# Patient Record
Sex: Male | Born: 1945 | Race: Black or African American | Hispanic: No | Marital: Married | Smoking: Never smoker
Health system: Southern US, Community
[De-identification: ages and names within clinical notes are randomized; demographics above are authoritative.]

## PROBLEM LIST (undated history)

## (undated) DIAGNOSIS — R519 Headache, unspecified: Secondary | ICD-10-CM

## (undated) DIAGNOSIS — R51 Headache: Secondary | ICD-10-CM

## (undated) DIAGNOSIS — I10 Essential (primary) hypertension: Secondary | ICD-10-CM

## (undated) DIAGNOSIS — E119 Type 2 diabetes mellitus without complications: Secondary | ICD-10-CM

## (undated) DIAGNOSIS — M1990 Unspecified osteoarthritis, unspecified site: Secondary | ICD-10-CM

## (undated) HISTORY — PX: HIP ARTHROPLASTY: SHX981

## (undated) HISTORY — PX: DG BARIUM ENEMA (ARMC HX): HXRAD1447

## (undated) HISTORY — PX: COLONOSCOPY: SHX174

---

## 2002-08-16 HISTORY — PX: KNEE ARTHROSCOPY: SHX127

## 2003-04-07 ENCOUNTER — Emergency Department (HOSPITAL_COMMUNITY): Admission: EM | Admit: 2003-04-07 | Discharge: 2003-04-07 | Payer: Self-pay | Admitting: Emergency Medicine

## 2003-06-13 ENCOUNTER — Emergency Department (HOSPITAL_COMMUNITY): Admission: EM | Admit: 2003-06-13 | Discharge: 2003-06-13 | Payer: Self-pay | Admitting: Emergency Medicine

## 2009-04-23 ENCOUNTER — Emergency Department (HOSPITAL_COMMUNITY): Admission: EM | Admit: 2009-04-23 | Discharge: 2009-04-23 | Payer: Self-pay | Admitting: Emergency Medicine

## 2017-07-28 ENCOUNTER — Ambulatory Visit: Payer: Self-pay | Admitting: Orthopedic Surgery

## 2017-07-28 NOTE — Pre-Procedure Instructions (Signed)
Spoke with Santiago Bur requesting pre op orders from Dr. Lyla Glassing for pre op appointment 07/29/17.

## 2017-07-29 ENCOUNTER — Encounter (HOSPITAL_COMMUNITY)
Admission: RE | Admit: 2017-07-29 | Discharge: 2017-07-29 | Disposition: A | Discharge status: Home / Self Care | Payer: Self-pay | Source: Ambulatory Visit | Attending: Orthopedic Surgery | Admitting: Orthopedic Surgery

## 2017-07-29 ENCOUNTER — Encounter (HOSPITAL_COMMUNITY): Payer: Self-pay

## 2017-07-29 ENCOUNTER — Other Ambulatory Visit: Payer: Self-pay

## 2017-07-29 DIAGNOSIS — M1711 Unilateral primary osteoarthritis, right knee: Secondary | ICD-10-CM | POA: Insufficient documentation

## 2017-07-29 DIAGNOSIS — Z01818 Encounter for other preprocedural examination: Secondary | ICD-10-CM | POA: Insufficient documentation

## 2017-07-29 HISTORY — DX: Type 2 diabetes mellitus without complications: E11.9

## 2017-07-29 HISTORY — DX: Headache: R51

## 2017-07-29 HISTORY — DX: Essential (primary) hypertension: I10

## 2017-07-29 HISTORY — DX: Headache, unspecified: R51.9

## 2017-07-29 HISTORY — DX: Unspecified osteoarthritis, unspecified site: M19.90

## 2017-07-29 LAB — SURGICAL PCR SCREEN
MRSA, PCR: NEGATIVE
STAPHYLOCOCCUS AUREUS: NEGATIVE

## 2017-07-29 LAB — BASIC METABOLIC PANEL
Anion gap: 10 (ref 5–15)
BUN: 20 mg/dL (ref 6–20)
CHLORIDE: 101 mmol/L (ref 101–111)
CO2: 28 mmol/L (ref 22–32)
CREATININE: 1.26 mg/dL — AB (ref 0.61–1.24)
Calcium: 9.5 mg/dL (ref 8.9–10.3)
GFR, EST NON AFRICAN AMERICAN: 55 mL/min — AB (ref >60)
Glucose, Bld: 110 mg/dL — ABNORMAL HIGH (ref 65–99)
POTASSIUM: 3.7 mmol/L (ref 3.5–5.1)
SODIUM: 139 mmol/L (ref 135–145)

## 2017-07-29 LAB — CBC
HCT: 33.1 % — ABNORMAL LOW (ref 39.0–52.0)
HEMOGLOBIN: 11.5 g/dL — AB (ref 13.0–17.0)
MCH: 33 pg (ref 26.0–34.0)
MCHC: 34.7 g/dL (ref 30.0–36.0)
MCV: 94.8 fL (ref 78.0–100.0)
PLATELETS: 213 10*3/uL (ref 150–400)
RBC: 3.49 MIL/uL — AB (ref 4.22–5.81)
RDW: 12.7 % (ref 11.5–15.5)
WBC: 6.9 10*3/uL (ref 4.0–10.5)

## 2017-07-29 LAB — GLUCOSE, CAPILLARY: GLUCOSE-CAPILLARY: 116 mg/dL — AB (ref 65–99)

## 2017-07-29 NOTE — Pre-Procedure Instructions (Signed)
CBC and BMP results faxed to Dr. Lyla Glassing via epic.

## 2017-07-29 NOTE — Pre-Procedure Instructions (Signed)
Surgical clearance from Dr. Hortencia Conradi 07/26/17, Hgb A 1 C 6.5 07/26/17, CBC results 07/26/17 in chart

## 2017-07-29 NOTE — Patient Instructions (Addendum)
[name] [name]  07/29/2017   Your procedure is scheduled on: Thursday, Dec. 20, 2018   Report to [facility] Main  Entrance   Take [place]  elevators to 3rd floor to  [place] at 10:00 AM.    Call this number if you have problems the morning of surgery [phone]    Remember: ONLY 1 PERSON MAY GO WITH YOU TO SHORT STAY TO GET  READY MORNING OF [place].   Do not eat food or drink liquids :After Midnight.    Take these medicines the morning of surgery with A SIP OF WATER: Amlodipine   DO NOT TAKE ANY DIABETIC MEDICATIONS DAY OF YOUR SURGERY   Do NOT do Regular Insulin the night before surgery   Do not take Metformin the night before surgery                               You may not have any metal on your body              Do not wear jewelry, make-up, lotions, powders or perfumes, deodorant                        Men may shave face and neck.   Do not bring valuables to the hospital. [place].   Contacts, dentures or bridgework may not be worn into surgery.   Leave suitcase in the car. After surgery it may be brought to your room.              Please read over the following fact sheets you were given: _____________________________________________________________________ [facility] - Preparing for Surgery Before surgery, you can play an important role.  Because skin is not sterile, your skin needs to be as free of germs as possible.  You can reduce the number of germs on your skin by washing with CHG (chlorahexidine gluconate) soap before surgery.  CHG is an antiseptic cleaner which kills germs and bonds with the skin to continue killing germs even after washing. Please DO NOT use if you have an allergy to CHG or antibacterial soaps.  If your skin becomes reddened/irritated stop using the CHG and inform your nurse when you arrive at Short Stay. Do not shave (including legs and underarms) for  at least 48 hours prior to the first CHG shower.  You may shave your face/neck.  Please follow these instructions carefully:  1.  Shower with CHG Soap the night before surgery and the  morning of surgery.  2.  If you choose to wash your hair, wash your hair first as usual with your normal  shampoo.  3.  After you shampoo, rinse your hair and body thoroughly to remove the shampoo.                             4.  Use CHG as you would any other liquid soap.  You can apply chg directly to the skin and wash.  Gently with a scrungie or clean washcloth.  5.  Apply the CHG Soap to your body ONLY FROM THE NECK DOWN.   Do   not use on face/ open  Wound or open sores. Avoid contact with eyes, ears mouth and   genitals (private parts).                       Wash face,  Genitals (private parts) with your normal soap.             6.  Wash thoroughly, paying special attention to the area where your    surgery  will be performed.  7.  Thoroughly rinse your body with warm water from the neck down.  8.  DO NOT shower/wash with your normal soap after using and rinsing off the CHG Soap.                9.  Pat yourself dry with a clean towel.            10.  Wear clean pajamas.            11.  Place clean sheets on your bed the night of your first shower and do not  sleep with pets. Day of Surgery : Do not apply any lotions/deodorants the morning of surgery.  Please wear clean clothes to the hospital/surgery center.  FAILURE TO FOLLOW THESE INSTRUCTIONS MAY RESULT IN THE CANCELLATION OF YOUR SURGERY  PATIENT SIGNATURE_________________________________  NURSE SIGNATURE__________________________________  ________________________________________________________________________   Adam Phenix  An incentive spirometer is a tool that can help keep your lungs clear and active. This tool measures how well you are filling your lungs with each breath. Taking long deep breaths may help  reverse or decrease the chance of developing breathing (pulmonary) problems (especially infection) following:  A long period of time when you are unable to move or be active. BEFORE THE PROCEDURE   If the spirometer includes an indicator to show your best effort, your nurse or respiratory therapist will set it to a desired goal.  If possible, sit up straight or lean slightly forward. Try not to slouch.  Hold the incentive spirometer in an upright position. INSTRUCTIONS FOR USE  1. Sit on the edge of your bed if possible, or sit up as far as you can in bed or on a chair. 2. Hold the incentive spirometer in an upright position. 3. Breathe out normally. 4. Place the mouthpiece in your mouth and seal your lips tightly around it. 5. Breathe in slowly and as deeply as possible, raising the piston or the ball toward the top of the column. 6. Hold your breath for 3-5 seconds or for as long as possible. Allow the piston or ball to fall to the bottom of the column. 7. Remove the mouthpiece from your mouth and breathe out normally. 8. Rest for a few seconds and repeat Steps 1 through 7 at least 10 times every 1-2 hours when you are awake. Take your time and take a few normal breaths between deep breaths. 9. The spirometer may include an indicator to show your best effort. Use the indicator as a goal to work toward during each repetition. 10. After each set of 10 deep breaths, practice coughing to be sure your lungs are clear. If you have an incision (the cut made at the time of surgery), support your incision when coughing by placing a pillow or rolled up towels firmly against it. Once you are able to get out of bed, walk around indoors and cough well. You may stop using the incentive spirometer when instructed by your caregiver.  RISKS AND COMPLICATIONS  Take your time  so you do not get dizzy or light-headed.  If you are in pain, you may need to take or ask for pain medication before doing incentive  spirometry. It is harder to take a deep breath if you are having pain. AFTER USE  Rest and breathe slowly and easily.  It can be helpful to keep track of a log of your progress. Your caregiver can provide you with a simple table to help with this. If you are using the spirometer at home, follow these instructions: [place] IF:   You are having difficultly using the spirometer.  You have trouble using the spirometer as often as instructed.  Your pain medication is not giving enough relief while using the spirometer.  You develop fever of 100.5 F (38.1 C) or higher. SEEK IMMEDIATE MEDICAL CARE IF:   You cough up bloody sputum that had not been present before.  You develop fever of 102 F (38.9 C) or greater.  You develop worsening pain at or near the incision site. MAKE SURE YOU:   Understand these instructions.  Will watch your condition.  Will get help right away if you are not doing well or get worse. Document Released: 12/13/2006 Document Revised: 10/25/2011 Document Reviewed: 02/13/2007 Exit[employer] Patient Information 2014 [employer], [place].   ________________________________________________________________________  WHAT IS A BLOOD TRANSFUSION? Blood Transfusion Information  A transfusion is the replacement of blood or some of its parts. Blood is made up of multiple cells which provide different functions.  Red blood cells carry oxygen and are used for blood loss replacement.  White blood cells fight against infection.  Platelets control bleeding.  Plasma helps clot blood.  Other blood products are available for specialized needs, such as hemophilia or other clotting disorders. BEFORE THE TRANSFUSION  Who gives blood for transfusions?   Healthy volunteers who are fully evaluated to make sure their blood is safe. This is blood bank blood. Transfusion therapy is the safest it has ever been in the practice of medicine. Before blood is taken from a donor, a  complete history is taken to make sure that person has no history of diseases nor engages in risky social behavior (examples are intravenous drug use or sexual activity with multiple partners). The donor's travel history is screened to minimize risk of transmitting infections, such as malaria. The donated blood is tested for signs of infectious diseases, such as HIV and hepatitis. The blood is then tested to be sure it is compatible with you in order to minimize the chance of a transfusion reaction. If you or a relative donates blood, this is often done in anticipation of surgery and is not appropriate for emergency situations. It takes many days to process the donated blood. RISKS AND COMPLICATIONS Although transfusion therapy is very safe and saves many lives, the main dangers of transfusion include:   Getting an infectious disease.  Developing a transfusion reaction. This is an allergic reaction to something in the blood you were given. Every precaution is taken to prevent this. The decision to have a blood transfusion has been considered carefully by your caregiver before blood is given. Blood is not given unless the benefits outweigh the risks. AFTER THE TRANSFUSION  Right after receiving a blood transfusion, you will usually feel much better and more energetic. This is especially true if your red blood cells have gotten low (anemic). The transfusion raises the level of the red blood cells which carry oxygen, and this usually causes an energy increase.  The  nurse administering the transfusion will monitor you carefully for complications. HOME CARE INSTRUCTIONS  No special instructions are needed after a transfusion. You may find your energy is better. Speak with your caregiver about any limitations on activity for underlying diseases you may have. SEEK MEDICAL CARE IF:   Your condition is not improving after your transfusion.  You develop redness or irritation at the intravenous (IV)  site. SEEK IMMEDIATE MEDICAL CARE IF:  Any of the following symptoms occur over the next 12 hours:  Shaking chills.  You have a temperature by mouth above 102 F (38.9 C), not controlled by medicine.  Chest, back, or muscle pain.  People around you feel you are not acting correctly or are confused.  Shortness of breath or difficulty breathing.  Dizziness and fainting.  You get a rash or develop hives.  You have a decrease in urine output.  Your urine turns a dark color or changes to pink, red, or [place]. Any of the following symptoms occur over the next 10 days:  You have a temperature by mouth above 102 F (38.9 C), not controlled by medicine.  Shortness of breath.  Weakness after normal activity.  The white part of the eye turns yellow (jaundice).  You have a decrease in the amount of urine or are urinating less often.  Your urine turns a dark color or changes to pink, red, or [place]. Document Released: 07/30/2000 Document Revised: 10/25/2011 Document Reviewed: 03/18/2008 [facility] Patient Information 2014 [place], [place].  _______________________________________________________________________

## 2017-07-30 LAB — ABO/RH: ABO/RH(D): B POS

## 2017-07-31 ENCOUNTER — Ambulatory Visit: Payer: Self-pay | Admitting: Orthopedic Surgery

## 2017-07-31 NOTE — H&P (Signed)
TOTAL KNEE ADMISSION H&P  Patient is being admitted for right total knee arthroplasty.  Subjective:  Chief Complaint:right knee pain.  HPI: [name] [name], 71 y.o. male, has a history of pain and functional disability in the right knee due to arthritis and has failed non-surgical conservative treatments for greater than 12 weeks to includeNSAID's and/or analgesics, corticosteriod injections, flexibility and strengthening excercises, use of assistive devices, weight reduction as appropriate and activity modification.  Onset of symptoms was gradual, starting 5 years ago with gradually worsening course since that time. The patient noted no past surgery on the right knee(s).  Patient currently rates pain in the right knee(s) at 10 out of 10 with activity. Patient has night pain, worsening of pain with activity and weight bearing, pain that interferes with activities of daily living, pain with passive range of motion, crepitus and joint swelling.  Patient has evidence of subchondral cysts, subchondral sclerosis, joint subluxation and joint space narrowing by imaging studies. There is no active infection.  There are no active problems to display for this patient.  Past Medical History:  Diagnosis Date  . Arthritis    both knees  . Diabetes mellitus without complication ([place])    type II  . Headache   . Hypertension     Past Surgical History:  Procedure Laterality Date  . COLONOSCOPY    . 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 HIP ARTHROPLASTY Right   . KNEE ARTHROSCOPY Right 2004    Current Outpatient Medications  Medication Sig Dispense Refill Last Dose  . amLODipine (NORVASC) 10 MG tablet Take 10 mg by mouth daily.     Marland Kitchen atorvastatin (LIPITOR) 80 MG tablet Take 80 mg by mouth daily.     Marland Kitchen docusate sodium (COLACE) 50 MG capsule Take 50 mg by mouth 2 (two) times daily.     . finasteride (PROSCAR) 5 MG tablet Take 5 mg by mouth daily.     Marland Kitchen glucose blood test strip 1 each by Other route as needed  for other. Use as instructed     . hydrochlorothiazide (HYDRODIURIL) 25 MG tablet Take 25 mg by mouth daily.     . insulin regular (NOVOLIN R,HUMULIN R) 100 units/mL injection Inject 100 Units into the skin 3 (three) times daily before meals.     Marland Kitchen lisinopril (PRINIVIL,ZESTRIL) 40 MG tablet Take 40 mg by mouth daily.     . metFORMIN (GLUCOPHAGE) 1000 MG tablet Take 1,000 mg by mouth 2 (two) times daily with a meal.     . sildenafil (VIAGRA) 100 MG tablet Take 100 mg by mouth daily as needed for erectile dysfunction.      No current facility-administered medications for this visit.    No Known Allergies  Social History   Tobacco Use  . Smoking status: Never Smoker  . Smokeless tobacco: Never Used  Substance Use Topics  . Alcohol use: Yes    No family history on file.   Review of Systems  Constitutional: Negative.   HENT: Positive for hearing loss.   Eyes: Negative.   Respiratory: Negative.   Cardiovascular: Negative.   Gastrointestinal: Negative.   Genitourinary: Negative.   Musculoskeletal: Positive for joint pain.  Neurological: Negative.   Endo/Heme/Allergies: Negative.   Psychiatric/Behavioral: Negative.     Objective:  Physical Exam  Vitals reviewed. Constitutional: He is oriented to person, place, and time. He appears well-developed and well-nourished.  HENT:  Head: Normocephalic and atraumatic.  Eyes: Conjunctivae and EOM are normal. Pupils  are equal, round, and reactive to light.  Neck: Normal range of motion. Neck supple.  Cardiovascular: Normal rate, regular rhythm and intact distal pulses.  Respiratory: Effort normal. No respiratory distress.  GI: Soft. He exhibits no distension.  Genitourinary:  Genitourinary Comments: deferred  Musculoskeletal:       Right knee: He exhibits decreased range of motion, swelling, effusion and abnormal alignment. Tenderness found. Medial joint line tenderness noted.  Neurological: He is alert and oriented to person, place,  and time. He has normal reflexes.  Skin: Skin is warm and dry.  Psychiatric: He has a normal mood and affect. His behavior is normal. Judgment and thought content normal.    Vital signs in last 24 hours: @VSRANGES @  Labs:   Estimated body mass index is 30.87 kg/m as calculated from the following:   Height as of 07/29/17: 6\' 3"  (1.905 m).   Weight as of 07/29/17: 112 kg (247 lb).   Imaging Review Plain radiographs demonstrate severe degenerative joint disease of the right knee(s). The overall alignment issignificant varus. The bone quality appears to be adequate for age and reported activity level.  Assessment/Plan:  End stage arthritis, right knee   The patient history, physical examination, clinical judgment of the provider and imaging studies are consistent with end stage degenerative joint disease of the right knee(s) and total knee arthroplasty is deemed medically necessary. The treatment options including medical management, injection therapy arthroscopy and arthroplasty were discussed at length. The risks and benefits of total knee arthroplasty were presented and reviewed. The risks due to aseptic loosening, infection, stiffness, patella tracking problems, thromboembolic complications and other imponderables were discussed. The patient acknowledged the explanation, agreed to proceed with the plan and consent was signed. Patient is being admitted for inpatient treatment for surgery, pain control, PT, OT, prophylactic antibiotics, VTE prophylaxis, progressive ambulation and ADL's and discharge planning. The patient is planning to be discharged home with home health services

## 2017-07-31 NOTE — H&P (View-Only) (Signed)
TOTAL KNEE ADMISSION H&P  Patient is being admitted for right total knee arthroplasty.  Subjective:  Chief Complaint:right knee pain.  HPI: [name] [name], 71 y.o. male, has a history of pain and functional disability in the right knee due to arthritis and has failed non-surgical conservative treatments for greater than 12 weeks to includeNSAID's and/or analgesics, corticosteriod injections, flexibility and strengthening excercises, use of assistive devices, weight reduction as appropriate and activity modification.  Onset of symptoms was gradual, starting 5 years ago with gradually worsening course since that time. The patient noted no past surgery on the right knee(s).  Patient currently rates pain in the right knee(s) at 10 out of 10 with activity. Patient has night pain, worsening of pain with activity and weight bearing, pain that interferes with activities of daily living, pain with passive range of motion, crepitus and joint swelling.  Patient has evidence of subchondral cysts, subchondral sclerosis, joint subluxation and joint space narrowing by imaging studies. There is no active infection.  There are no active problems to display for this patient.  Past Medical History:  Diagnosis Date  . Arthritis    both knees  . Diabetes mellitus without complication ([place])    type II  . Headache   . Hypertension     Past Surgical History:  Procedure Laterality Date  . COLONOSCOPY    . 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 HIP ARTHROPLASTY Right   . KNEE ARTHROSCOPY Right 2004    Current Outpatient Medications  Medication Sig Dispense Refill Last Dose  . amLODipine (NORVASC) 10 MG tablet Take 10 mg by mouth daily.     Marland Kitchen atorvastatin (LIPITOR) 80 MG tablet Take 80 mg by mouth daily.     Marland Kitchen docusate sodium (COLACE) 50 MG capsule Take 50 mg by mouth 2 (two) times daily.     . finasteride (PROSCAR) 5 MG tablet Take 5 mg by mouth daily.     Marland Kitchen glucose blood test strip 1 each by Other route as needed  for other. Use as instructed     . hydrochlorothiazide (HYDRODIURIL) 25 MG tablet Take 25 mg by mouth daily.     . insulin regular (NOVOLIN R,HUMULIN R) 100 units/mL injection Inject 100 Units into the skin 3 (three) times daily before meals.     Marland Kitchen lisinopril (PRINIVIL,ZESTRIL) 40 MG tablet Take 40 mg by mouth daily.     . metFORMIN (GLUCOPHAGE) 1000 MG tablet Take 1,000 mg by mouth 2 (two) times daily with a meal.     . sildenafil (VIAGRA) 100 MG tablet Take 100 mg by mouth daily as needed for erectile dysfunction.      No current facility-administered medications for this visit.    No Known Allergies  Social History   Tobacco Use  . Smoking status: Never Smoker  . Smokeless tobacco: Never Used  Substance Use Topics  . Alcohol use: Yes    No family history on file.   Review of Systems  Constitutional: Negative.   HENT: Positive for hearing loss.   Eyes: Negative.   Respiratory: Negative.   Cardiovascular: Negative.   Gastrointestinal: Negative.   Genitourinary: Negative.   Musculoskeletal: Positive for joint pain.  Neurological: Negative.   Endo/Heme/Allergies: Negative.   Psychiatric/Behavioral: Negative.     Objective:  Physical Exam  Vitals reviewed. Constitutional: He is oriented to person, place, and time. He appears well-developed and well-nourished.  HENT:  Head: Normocephalic and atraumatic.  Eyes: Conjunctivae and EOM are normal. Pupils  are equal, round, and reactive to light.  Neck: Normal range of motion. Neck supple.  Cardiovascular: Normal rate, regular rhythm and intact distal pulses.  Respiratory: Effort normal. No respiratory distress.  GI: Soft. He exhibits no distension.  Genitourinary:  Genitourinary Comments: deferred  Musculoskeletal:       Right knee: He exhibits decreased range of motion, swelling, effusion and abnormal alignment. Tenderness found. Medial joint line tenderness noted.  Neurological: He is alert and oriented to person, place,  and time. He has normal reflexes.  Skin: Skin is warm and dry.  Psychiatric: He has a normal mood and affect. His behavior is normal. Judgment and thought content normal.    Vital signs in last 24 hours: @VSRANGES @  Labs:   Estimated body mass index is 30.87 kg/m as calculated from the following:   Height as of 07/29/17: 6\' 3"  (1.905 m).   Weight as of 07/29/17: 112 kg (247 lb).   Imaging Review Plain radiographs demonstrate severe degenerative joint disease of the right knee(s). The overall alignment issignificant varus. The bone quality appears to be adequate for age and reported activity level.  Assessment/Plan:  End stage arthritis, right knee   The patient history, physical examination, clinical judgment of the provider and imaging studies are consistent with end stage degenerative joint disease of the right knee(s) and total knee arthroplasty is deemed medically necessary. The treatment options including medical management, injection therapy arthroscopy and arthroplasty were discussed at length. The risks and benefits of total knee arthroplasty were presented and reviewed. The risks due to aseptic loosening, infection, stiffness, patella tracking problems, thromboembolic complications and other imponderables were discussed. The patient acknowledged the explanation, agreed to proceed with the plan and consent was signed. Patient is being admitted for inpatient treatment for surgery, pain control, PT, OT, prophylactic antibiotics, VTE prophylaxis, progressive ambulation and ADL's and discharge planning. The patient is planning to be discharged home with home health services

## 2017-08-04 ENCOUNTER — Inpatient Hospital Stay (HOSPITAL_COMMUNITY): Payer: Medicare Other | Admitting: Certified Registered Nurse Anesthetist

## 2017-08-04 ENCOUNTER — Other Ambulatory Visit: Payer: Self-pay

## 2017-08-04 ENCOUNTER — Inpatient Hospital Stay (HOSPITAL_COMMUNITY): Payer: Medicare Other

## 2017-08-04 ENCOUNTER — Encounter (HOSPITAL_COMMUNITY): Payer: Self-pay | Admitting: Certified Registered Nurse Anesthetist

## 2017-08-04 ENCOUNTER — Encounter (HOSPITAL_COMMUNITY)
Admission: RE | Disposition: A | Discharge status: Home / Self Care | Payer: Self-pay | Source: Ambulatory Visit | Attending: Orthopedic Surgery

## 2017-08-04 ENCOUNTER — Inpatient Hospital Stay (HOSPITAL_COMMUNITY)
Admission: RE | Admit: 2017-08-04 | Discharge: 2017-08-05 | DRG: 470 | Disposition: A | Discharge status: Home / Self Care | Payer: Medicare Other | Source: Ambulatory Visit | Attending: Orthopedic Surgery | Admitting: Orthopedic Surgery

## 2017-08-04 DIAGNOSIS — Z96641 Presence of right artificial hip joint: Secondary | ICD-10-CM | POA: Diagnosis present

## 2017-08-04 DIAGNOSIS — Z794 Long term (current) use of insulin: Secondary | ICD-10-CM | POA: Diagnosis not present

## 2017-08-04 DIAGNOSIS — I10 Essential (primary) hypertension: Secondary | ICD-10-CM | POA: Diagnosis present

## 2017-08-04 DIAGNOSIS — E119 Type 2 diabetes mellitus without complications: Secondary | ICD-10-CM | POA: Diagnosis present

## 2017-08-04 DIAGNOSIS — H9190 Unspecified hearing loss, unspecified ear: Secondary | ICD-10-CM | POA: Diagnosis present

## 2017-08-04 DIAGNOSIS — Z96651 Presence of right artificial knee joint: Secondary | ICD-10-CM

## 2017-08-04 DIAGNOSIS — M1711 Unilateral primary osteoarthritis, right knee: Secondary | ICD-10-CM | POA: Diagnosis present

## 2017-08-04 DIAGNOSIS — M25561 Pain in right knee: Secondary | ICD-10-CM | POA: Diagnosis present

## 2017-08-04 DIAGNOSIS — Z79899 Other long term (current) drug therapy: Secondary | ICD-10-CM | POA: Diagnosis not present

## 2017-08-04 HISTORY — PX: KNEE ARTHROPLASTY: SHX992

## 2017-08-04 LAB — TYPE AND SCREEN
ABO/RH(D): B POS
Antibody Screen: NEGATIVE

## 2017-08-04 LAB — GLUCOSE, CAPILLARY
Glucose-Capillary: 111 mg/dL — ABNORMAL HIGH (ref 65–99)
Glucose-Capillary: 145 mg/dL — ABNORMAL HIGH (ref 65–99)
Glucose-Capillary: 210 mg/dL — ABNORMAL HIGH (ref 65–99)

## 2017-08-04 SURGERY — ARTHROPLASTY, KNEE, TOTAL, USING IMAGELESS COMPUTER-ASSISTED NAVIGATION
Anesthesia: Spinal | Site: Knee | Laterality: Right

## 2017-08-04 MED ORDER — MIDAZOLAM HCL 5 MG/5ML IJ SOLN
INTRAMUSCULAR | Status: DC | PRN
  Administered 2017-08-04 (×2): 1 mg via INTRAVENOUS

## 2017-08-04 MED ORDER — FENTANYL CITRATE (PF) 100 MCG/2ML IJ SOLN
INTRAMUSCULAR | Status: DC | PRN
  Administered 2017-08-04: 50 ug via INTRAVENOUS
  Administered 2017-08-04 (×2): 25 ug via INTRAVENOUS

## 2017-08-04 MED ORDER — SENNA 8.6 MG PO TABS
2.0000 | ORAL_TABLET | Freq: Every day | ORAL | Status: DC
  Administered 2017-08-04: 17.2 mg via ORAL
  Filled 2017-08-04: qty 2

## 2017-08-04 MED ORDER — ALUM & MAG HYDROXIDE-SIMETH 200-200-20 MG/5ML PO SUSP: 30.0000 mL | ORAL | Status: DC | PRN

## 2017-08-04 MED ORDER — FENTANYL CITRATE (PF) 100 MCG/2ML IJ SOLN
50.0000 ug | INTRAMUSCULAR | Status: AC
  Administered 2017-08-04: 50 ug via INTRAVENOUS

## 2017-08-04 MED ORDER — LACTATED RINGERS IV SOLN
INTRAVENOUS | Status: DC
  Administered 2017-08-04 (×4): via INTRAVENOUS

## 2017-08-04 MED ORDER — LIDOCAINE HCL (PF) 2 % IJ SOLN: INTRAMUSCULAR | Status: DC | PRN

## 2017-08-04 MED ORDER — METOCLOPRAMIDE HCL 5 MG/ML IJ SOLN: 5.0000 mg | Freq: Three times a day (TID) | INTRAMUSCULAR | Status: DC | PRN

## 2017-08-04 MED ORDER — LORAZEPAM 2 MG/ML IJ SOLN
1.0000 mg | Freq: Once | INTRAMUSCULAR | Status: DC
  Filled 2017-08-04: qty 0.5

## 2017-08-04 MED ORDER — ASPIRIN 81 MG PO CHEW
81.0000 mg | CHEWABLE_TABLET | Freq: Two times a day (BID) | ORAL | Status: DC
  Administered 2017-08-04 – 2017-08-05 (×2): 81 mg via ORAL
  Filled 2017-08-04 (×2): qty 1

## 2017-08-04 MED ORDER — ATORVASTATIN CALCIUM 40 MG PO TABS
80.0000 mg | ORAL_TABLET | Freq: Every day | ORAL | Status: DC
  Administered 2017-08-05: 80 mg via ORAL
  Filled 2017-08-04: qty 2

## 2017-08-04 MED ORDER — SODIUM CHLORIDE 0.9 % IR SOLN
Status: DC | PRN
  Administered 2017-08-04: 4000 mL

## 2017-08-04 MED ORDER — ALBUMIN HUMAN 5 % IV SOLN
INTRAVENOUS | Status: AC
  Filled 2017-08-04: qty 250

## 2017-08-04 MED ORDER — ONDANSETRON HCL 4 MG/2ML IJ SOLN: 4.0000 mg | Freq: Four times a day (QID) | INTRAMUSCULAR | Status: DC | PRN

## 2017-08-04 MED ORDER — DEXAMETHASONE SODIUM PHOSPHATE 10 MG/ML IJ SOLN
INTRAMUSCULAR | Status: DC | PRN
  Administered 2017-08-04: 8 mg via INTRAVENOUS

## 2017-08-04 MED ORDER — KETOROLAC TROMETHAMINE 15 MG/ML IJ SOLN
7.5000 mg | Freq: Four times a day (QID) | INTRAMUSCULAR | Status: DC
  Administered 2017-08-04 – 2017-08-05 (×3): 7.5 mg via INTRAVENOUS
  Filled 2017-08-04 (×3): qty 1

## 2017-08-04 MED ORDER — BUPIVACAINE-EPINEPHRINE 0.25% -1:200000 IJ SOLN
INTRAMUSCULAR | Status: DC | PRN
  Administered 2017-08-04: 30 mL

## 2017-08-04 MED ORDER — MIDAZOLAM HCL 2 MG/2ML IJ SOLN
INTRAMUSCULAR | Status: AC
  Filled 2017-08-04: qty 2

## 2017-08-04 MED ORDER — PROPOFOL 500 MG/50ML IV EMUL
INTRAVENOUS | Status: DC | PRN
  Administered 2017-08-04: 50 ug/kg/min via INTRAVENOUS

## 2017-08-04 MED ORDER — HYDROMORPHONE HCL 1 MG/ML IJ SOLN
INTRAMUSCULAR | Status: AC
  Filled 2017-08-04: qty 2

## 2017-08-04 MED ORDER — PROPOFOL 10 MG/ML IV BOLUS
INTRAVENOUS | Status: AC
  Filled 2017-08-04: qty 20

## 2017-08-04 MED ORDER — AMLODIPINE BESYLATE 10 MG PO TABS
10.0000 mg | ORAL_TABLET | Freq: Every day | ORAL | Status: DC
  Administered 2017-08-05: 10 mg via ORAL
  Filled 2017-08-04: qty 1

## 2017-08-04 MED ORDER — BUPIVACAINE IN DEXTROSE 0.75-8.25 % IT SOLN
INTRATHECAL | Status: DC | PRN
  Administered 2017-08-04: 1.9 mL via INTRATHECAL

## 2017-08-04 MED ORDER — SODIUM CHLORIDE 0.9 % IV SOLN: INTRAVENOUS | Status: DC

## 2017-08-04 MED ORDER — ROPIVACAINE HCL 7.5 MG/ML IJ SOLN
INTRAMUSCULAR | Status: DC | PRN
  Administered 2017-08-04: 20 mL via PERINEURAL

## 2017-08-04 MED ORDER — CEFAZOLIN SODIUM-DEXTROSE 2-4 GM/100ML-% IV SOLN
2.0000 g | INTRAVENOUS | Status: AC
  Administered 2017-08-04: 2 g via INTRAVENOUS
  Filled 2017-08-04: qty 100

## 2017-08-04 MED ORDER — ISOPROPYL ALCOHOL 70 % SOLN
Status: DC | PRN
  Administered 2017-08-04: 1 via TOPICAL

## 2017-08-04 MED ORDER — INSULIN ASPART 100 UNIT/ML ~~LOC~~ SOLN
0.0000 [IU] | Freq: Three times a day (TID) | SUBCUTANEOUS | Status: DC
  Administered 2017-08-05 (×2): 2 [IU] via SUBCUTANEOUS

## 2017-08-04 MED ORDER — PHENOL 1.4 % MT LIQD: 1.0000 | OROMUCOSAL | Status: DC | PRN

## 2017-08-04 MED ORDER — CEFAZOLIN SODIUM-DEXTROSE 2-4 GM/100ML-% IV SOLN
2.0000 g | Freq: Four times a day (QID) | INTRAVENOUS | Status: AC
  Administered 2017-08-04 – 2017-08-05 (×2): 2 g via INTRAVENOUS
  Filled 2017-08-04 (×2): qty 100

## 2017-08-04 MED ORDER — METHOCARBAMOL 1000 MG/10ML IJ SOLN
500.0000 mg | Freq: Four times a day (QID) | INTRAVENOUS | Status: DC | PRN
  Filled 2017-08-04: qty 5

## 2017-08-04 MED ORDER — DOCUSATE SODIUM 100 MG PO CAPS
100.0000 mg | ORAL_CAPSULE | Freq: Two times a day (BID) | ORAL | Status: DC
  Administered 2017-08-04 – 2017-08-05 (×2): 100 mg via ORAL
  Filled 2017-08-04 (×2): qty 1

## 2017-08-04 MED ORDER — ONDANSETRON HCL 4 MG PO TABS: 4.0000 mg | ORAL_TABLET | Freq: Four times a day (QID) | ORAL | Status: DC | PRN

## 2017-08-04 MED ORDER — MIDAZOLAM HCL 2 MG/2ML IJ SOLN
1.0000 mg | INTRAMUSCULAR | Status: AC
Start: 2017-08-04 — End: 2017-08-04
  Administered 2017-08-04: 1 mg via INTRAVENOUS

## 2017-08-04 MED ORDER — METHOCARBAMOL 500 MG PO TABS: 500.0000 mg | ORAL_TABLET | Freq: Four times a day (QID) | ORAL | Status: DC | PRN

## 2017-08-04 MED ORDER — KETOROLAC TROMETHAMINE 30 MG/ML IJ SOLN
INTRAMUSCULAR | Status: AC
  Filled 2017-08-04: qty 1

## 2017-08-04 MED ORDER — HYDROCODONE-ACETAMINOPHEN 5-325 MG PO TABS
1.0000 | ORAL_TABLET | ORAL | Status: DC | PRN
  Administered 2017-08-04 (×2): 1 via ORAL
  Filled 2017-08-04 (×2): qty 1

## 2017-08-04 MED ORDER — FENTANYL CITRATE (PF) 100 MCG/2ML IJ SOLN
INTRAMUSCULAR | Status: AC
  Filled 2017-08-04: qty 2

## 2017-08-04 MED ORDER — EPHEDRINE 5 MG/ML INJ
INTRAVENOUS | Status: AC
  Filled 2017-08-04: qty 10

## 2017-08-04 MED ORDER — KETOROLAC TROMETHAMINE 30 MG/ML IJ SOLN
INTRAMUSCULAR | Status: DC | PRN
  Administered 2017-08-04: 30 mg

## 2017-08-04 MED ORDER — 0.9 % SODIUM CHLORIDE (POUR BTL) OPTIME
TOPICAL | Status: DC | PRN
  Administered 2017-08-04: 1000 mL

## 2017-08-04 MED ORDER — METOCLOPRAMIDE HCL 5 MG PO TABS: 5.0000 mg | ORAL_TABLET | Freq: Three times a day (TID) | ORAL | Status: DC | PRN

## 2017-08-04 MED ORDER — HYDROCODONE-ACETAMINOPHEN 5-325 MG PO TABS
2.0000 | ORAL_TABLET | ORAL | Status: DC | PRN
  Administered 2017-08-04 – 2017-08-05 (×2): 2 via ORAL
  Filled 2017-08-04 (×2): qty 2

## 2017-08-04 MED ORDER — ALBUMIN HUMAN 5 % IV SOLN
INTRAVENOUS | Status: DC | PRN
  Administered 2017-08-04: 14:00:00 via INTRAVENOUS

## 2017-08-04 MED ORDER — FINASTERIDE 5 MG PO TABS
5.0000 mg | ORAL_TABLET | Freq: Every day | ORAL | Status: DC
  Administered 2017-08-05: 08:00:00 5 mg via ORAL
  Filled 2017-08-04: qty 1

## 2017-08-04 MED ORDER — ACETAMINOPHEN 650 MG RE SUPP: 650.0000 mg | RECTAL | Status: DC | PRN

## 2017-08-04 MED ORDER — POLYETHYLENE GLYCOL 3350 17 G PO PACK: 17.0000 g | PACK | Freq: Every day | ORAL | Status: DC | PRN

## 2017-08-04 MED ORDER — TRANEXAMIC ACID 1000 MG/10ML IV SOLN
1000.0000 mg | INTRAVENOUS | Status: DC
  Filled 2017-08-04: qty 10

## 2017-08-04 MED ORDER — HYDROMORPHONE HCL 1 MG/ML IJ SOLN: 0.5000 mg | INTRAMUSCULAR | Status: DC | PRN

## 2017-08-04 MED ORDER — PROPOFOL 10 MG/ML IV BOLUS
INTRAVENOUS | Status: AC
  Filled 2017-08-04: qty 80

## 2017-08-04 MED ORDER — PROPOFOL 10 MG/ML IV BOLUS
INTRAVENOUS | Status: AC
  Filled 2017-08-04: qty 60

## 2017-08-04 MED ORDER — LISINOPRIL 20 MG PO TABS
40.0000 mg | ORAL_TABLET | Freq: Every day | ORAL | Status: DC
  Administered 2017-08-05: 08:00:00 40 mg via ORAL
  Filled 2017-08-04: qty 2

## 2017-08-04 MED ORDER — LIDOCAINE HCL (PF) 1 % IJ SOLN
INTRAMUSCULAR | Status: DC | PRN
  Administered 2017-08-04: 2 mL

## 2017-08-04 MED ORDER — ACETAMINOPHEN 325 MG PO TABS: 650.0000 mg | ORAL_TABLET | ORAL | Status: DC | PRN

## 2017-08-04 MED ORDER — METFORMIN HCL 500 MG PO TABS
1000.0000 mg | ORAL_TABLET | Freq: Two times a day (BID) | ORAL | Status: DC
  Administered 2017-08-05: 1000 mg via ORAL
  Filled 2017-08-04: qty 2

## 2017-08-04 MED ORDER — POVIDONE-IODINE 10 % EX SWAB
2.0000 "application " | Freq: Once | CUTANEOUS | Status: AC
  Administered 2017-08-04: 2 via TOPICAL

## 2017-08-04 MED ORDER — TRANEXAMIC ACID 1000 MG/10ML IV SOLN
1000.0000 mg | Freq: Once | INTRAVENOUS | Status: AC
  Administered 2017-08-04: 1000 mg via INTRAVENOUS
  Filled 2017-08-04: qty 1100

## 2017-08-04 MED ORDER — EPHEDRINE SULFATE-NACL 50-0.9 MG/10ML-% IV SOSY
PREFILLED_SYRINGE | INTRAVENOUS | Status: DC | PRN
  Administered 2017-08-04 (×7): 5 mg via INTRAVENOUS

## 2017-08-04 MED ORDER — STERILE WATER FOR IRRIGATION IR SOLN
Status: DC | PRN
  Administered 2017-08-04: 2000 mL

## 2017-08-04 MED ORDER — ONDANSETRON HCL 4 MG/2ML IJ SOLN
INTRAMUSCULAR | Status: DC | PRN
  Administered 2017-08-04: 4 mg via INTRAVENOUS

## 2017-08-04 MED ORDER — ISOPROPYL ALCOHOL 70 % SOLN
Status: AC
  Filled 2017-08-04: qty 480

## 2017-08-04 MED ORDER — MENTHOL 3 MG MT LOZG: 1.0000 | LOZENGE | OROMUCOSAL | Status: DC | PRN

## 2017-08-04 MED ORDER — PHENYLEPHRINE 40 MCG/ML (10ML) SYRINGE FOR IV PUSH (FOR BLOOD PRESSURE SUPPORT)
PREFILLED_SYRINGE | INTRAVENOUS | Status: DC | PRN
  Administered 2017-08-04 (×2): 80 ug via INTRAVENOUS

## 2017-08-04 MED ORDER — ACETAMINOPHEN 10 MG/ML IV SOLN
INTRAVENOUS | Status: AC
  Filled 2017-08-04: qty 100

## 2017-08-04 MED ORDER — BUPIVACAINE-EPINEPHRINE 0.25% -1:200000 IJ SOLN
INTRAMUSCULAR | Status: AC
Start: 2017-08-04 — End: ?
  Filled 2017-08-04: qty 1

## 2017-08-04 MED ORDER — SODIUM CHLORIDE 0.9 % IV SOLN
INTRAVENOUS | Status: DC
  Administered 2017-08-04: 18:00:00 150 mL/h via INTRAVENOUS

## 2017-08-04 MED ORDER — DEXAMETHASONE SODIUM PHOSPHATE 10 MG/ML IJ SOLN
10.0000 mg | Freq: Once | INTRAMUSCULAR | Status: AC
  Administered 2017-08-05: 10 mg via INTRAVENOUS
  Filled 2017-08-04: qty 1

## 2017-08-04 MED ORDER — ACETAMINOPHEN 10 MG/ML IV SOLN
1000.0000 mg | INTRAVENOUS | Status: AC
  Administered 2017-08-04: 1000 mg via INTRAVENOUS
  Filled 2017-08-04: qty 100

## 2017-08-04 MED ORDER — HYDROCHLOROTHIAZIDE 25 MG PO TABS
25.0000 mg | ORAL_TABLET | Freq: Every day | ORAL | Status: DC
  Administered 2017-08-05: 25 mg via ORAL
  Filled 2017-08-04: qty 1

## 2017-08-04 MED ORDER — FENTANYL CITRATE (PF) 100 MCG/2ML IJ SOLN: 25.0000 ug | INTRAMUSCULAR | Status: DC | PRN

## 2017-08-04 MED ORDER — DIPHENHYDRAMINE HCL 12.5 MG/5ML PO ELIX: 12.5000 mg | ORAL_SOLUTION | ORAL | Status: DC | PRN

## 2017-08-04 MED ORDER — CHLORHEXIDINE GLUCONATE 4 % EX LIQD: 60.0000 mL | Freq: Once | CUTANEOUS | Status: DC

## 2017-08-04 SURGICAL SUPPLY — 65 items
BAG ZIPLOCK 12X15 (MISCELLANEOUS) IMPLANT
BANDAGE ACE 4X5 VEL STRL LF (GAUZE/BANDAGES/DRESSINGS) ×3 IMPLANT
BANDAGE ACE 6X5 VEL STRL LF (GAUZE/BANDAGES/DRESSINGS) ×3 IMPLANT
BLADE SAW RECIPROCATING 77.5 (BLADE) ×3 IMPLANT
CAPT KNEE TRIATH TK-4 ×3 IMPLANT
CHLORAPREP W/TINT 26ML (MISCELLANEOUS) ×6 IMPLANT
COVER SURGICAL LIGHT HANDLE (MISCELLANEOUS) ×3 IMPLANT
CUFF TOURN SGL QUICK 34 (TOURNIQUET CUFF) ×2
CUFF TRNQT CYL 34X4X40X1 (TOURNIQUET CUFF) ×1 IMPLANT
DECANTER SPIKE VIAL GLASS SM (MISCELLANEOUS) ×6 IMPLANT
DERMABOND ADVANCED (GAUZE/BANDAGES/DRESSINGS) ×4
DERMABOND ADVANCED .7 DNX12 (GAUZE/BANDAGES/DRESSINGS) ×2 IMPLANT
DRAPE SHEET LG 3/4 BI-LAMINATE (DRAPES) ×6 IMPLANT
DRAPE U-SHAPE 47X51 STRL (DRAPES) ×3 IMPLANT
DRESSING AQUACEL AG SP 3.5X10 (GAUZE/BANDAGES/DRESSINGS) ×1 IMPLANT
DRSG AQUACEL AG SP 3.5X10 (GAUZE/BANDAGES/DRESSINGS) ×3
ELECT BLADE TIP CTD 4 INCH (ELECTRODE) ×3 IMPLANT
ELECT REM PT RETURN 15FT ADLT (MISCELLANEOUS) ×3 IMPLANT
EVACUATOR 1/8 PVC DRAIN (DRAIN) IMPLANT
GLOVE BIO SURGEON STRL SZ 6.5 (GLOVE) ×4 IMPLANT
GLOVE BIO SURGEON STRL SZ8.5 (GLOVE) ×6 IMPLANT
GLOVE BIO SURGEONS STRL SZ 6.5 (GLOVE) ×2
GLOVE BIOGEL PI IND STRL 6.5 (GLOVE) ×2 IMPLANT
GLOVE BIOGEL PI IND STRL 7.0 (GLOVE) ×4 IMPLANT
GLOVE BIOGEL PI IND STRL 7.5 (GLOVE) ×3 IMPLANT
GLOVE BIOGEL PI IND STRL 8.5 (GLOVE) ×1 IMPLANT
GLOVE BIOGEL PI INDICATOR 6.5 (GLOVE) ×4
GLOVE BIOGEL PI INDICATOR 7.0 (GLOVE) ×8
GLOVE BIOGEL PI INDICATOR 7.5 (GLOVE) ×6
GLOVE BIOGEL PI INDICATOR 8.5 (GLOVE) ×2
GLOVE ECLIPSE 7.0 STRL STRAW (GLOVE) ×3 IMPLANT
GLOVE SURG SS PI 7.5 STRL IVOR (GLOVE) ×3 IMPLANT
GOWN SPEC L3 XXLG W/TWL (GOWN DISPOSABLE) ×6 IMPLANT
GOWN STRL REUS W/TWL LRG LVL3 (GOWN DISPOSABLE) ×3 IMPLANT
GOWN STRL REUS W/TWL XL LVL3 (GOWN DISPOSABLE) ×9 IMPLANT
HANDPIECE INTERPULSE COAX TIP (DISPOSABLE) ×2
HOOD PEEL AWAY FLYTE STAYCOOL (MISCELLANEOUS) ×6 IMPLANT
INSERT BEARING TRI TIBIAL 8X9 IMPLANT
MARKER SKIN DUAL TIP RULER LAB (MISCELLANEOUS) ×3 IMPLANT
NEEDLE SPNL 18GX3.5 QUINCKE PK (NEEDLE) ×3 IMPLANT
NS IRRIG 1000ML POUR BTL (IV SOLUTION) ×3 IMPLANT
PACK TOTAL KNEE CUSTOM (KITS) ×3 IMPLANT
PADDING CAST COTTON 6X4 STRL (CAST SUPPLIES) ×3 IMPLANT
POSITIONER SURGICAL ARM (MISCELLANEOUS) ×3 IMPLANT
SAW OSC TIP CART 19.5X105X1.3 (SAW) ×3 IMPLANT
SEALER BIPOLAR AQUA 6.0 (INSTRUMENTS) ×3 IMPLANT
SET HNDPC FAN SPRY TIP SCT (DISPOSABLE) ×1 IMPLANT
SET PAD KNEE POSITIONER (MISCELLANEOUS) ×3 IMPLANT
SPONGE LAP 18X18 X RAY DECT (DISPOSABLE) IMPLANT
SUCTION FRAZIER HANDLE 12FR (TUBING) ×2
SUCTION TUBE FRAZIER 12FR DISP (TUBING) ×1 IMPLANT
SUT MNCRL AB 3-0 PS2 18 (SUTURE) ×3 IMPLANT
SUT MON AB 2-0 CT1 36 (SUTURE) ×6 IMPLANT
SUT STRATAFIX PDO 1 14 VIOLET (SUTURE) ×2
SUT STRATFX PDO 1 14 VIOLET (SUTURE) ×1
SUT VIC AB 1 CT1 36 (SUTURE) ×9 IMPLANT
SUT VIC AB 2-0 CT1 27 (SUTURE) ×2
SUT VIC AB 2-0 CT1 TAPERPNT 27 (SUTURE) ×1 IMPLANT
SUTURE STRATFX PDO 1 14 VIOLET (SUTURE) ×1 IMPLANT
SYR 50ML LL SCALE MARK (SYRINGE) ×3 IMPLANT
TOWER CARTRIDGE SMART MIX (DISPOSABLE) IMPLANT
TRAY FOLEY W/METER SILVER 16FR (SET/KITS/TRAYS/PACK) ×3 IMPLANT
TRI TIBIAL BEARING INSERT 8X9 IMPLANT
WRAP KNEE MAXI GEL POST OP (GAUZE/BANDAGES/DRESSINGS) ×3 IMPLANT
YANKAUER SUCT BULB TIP 10FT TU (MISCELLANEOUS) ×3 IMPLANT

## 2017-08-04 NOTE — Anesthesia Preprocedure Evaluation (Signed)
Anesthesia Evaluation  Patient identified by MRN, date of birth, ID band Patient awake    Reviewed: Allergy & Precautions, H&P , Patient's Chart, lab work & pertinent test results, reviewed documented beta blocker date and time   Airway Mallampati: II  TM Distance: >3 FB Neck ROM: full    Dental no notable dental hx.    Pulmonary    Pulmonary exam normal breath sounds clear to auscultation       Cardiovascular hypertension,  Rhythm:regular Rate:Normal     Neuro/Psych    GI/Hepatic   Endo/Other  diabetes, Type 2  Renal/GU      Musculoskeletal   Abdominal   Peds  Hematology   Anesthesia Other Findings   Reproductive/Obstetrics                             Anesthesia Physical Anesthesia Plan  ASA: III  Anesthesia Plan: Spinal   Post-op Pain Management:    Induction:   PONV Risk Score and Plan: 1 and Treatment may vary due to age or medical condition, Ondansetron and Dexamethasone  Airway Management Planned:   Additional Equipment:   Intra-op Plan:   Post-operative Plan:   Informed Consent: I have reviewed the patients History and Physical, chart, labs and discussed the procedure including the risks, benefits and alternatives for the proposed anesthesia with the patient or authorized representative who has indicated his/her understanding and acceptance.   Dental Advisory Given  Plan Discussed with: CRNA and Surgeon  Anesthesia Plan Comments: (  )        Anesthesia Quick Evaluation

## 2017-08-04 NOTE — Progress Notes (Signed)
AssistedDr. Lyndle Herrlich with right, ultrasound guided, adductor canal block. Side rails up, monitors on throughout procedure. See vital signs in flow sheet. Tolerated Procedure well.

## 2017-08-04 NOTE — Discharge Instructions (Signed)
° °Dr. Quindarrius Joplin °Total Joint Specialist °[facility] °[address] °[place],  [address] °[phone] ° °TOTAL KNEE REPLACEMENT POSTOPERATIVE DIRECTIONS ° ° ° °Knee Rehabilitation, Guidelines Following Surgery  °Results after knee surgery are often greatly improved when you follow the exercise, range of motion and muscle strengthening exercises prescribed by your doctor. Safety measures are also important to protect the knee from further injury. Any time any of these exercises cause you to have increased pain or swelling in your knee joint, decrease the amount until you are comfortable again and slowly increase them. If you have problems or questions, call your caregiver or physical therapist for advice.  ° °WEIGHT BEARING °Weight bearing as tolerated with assist device (walker, cane, etc) as directed, use it as long as suggested by your surgeon or therapist, typically at least 4-6 weeks. ° °HOME CARE INSTRUCTIONS  °Remove items at home which could result in a fall. This includes throw rugs or furniture in walking pathways.  °Continue medications as instructed at time of discharge. °You may have some home medications which will be placed on hold until you complete the course of blood thinner medication.  °You may start showering once you are discharged home but do not submerge the incision under water. Just pat the incision dry and apply a dry gauze dressing on daily. °Walk with walker as instructed.  °You may resume a sexual relationship in one month or when given the OK by your doctor.  °· Use walker as long as suggested by your caregivers. °· Avoid periods of inactivity such as sitting longer than an hour when not asleep. This helps prevent blood clots.  °You may put full weight on your legs and walk as much as is comfortable.  °You may return to work once you are cleared by your doctor.  °Do not drive a car for 6 weeks or until released by you surgeon.  °· Do not drive  while taking narcotics.  °Wear the elastic stockings for three weeks following surgery during the day but you may remove then at night. °Make sure you keep all of your appointments after your operation with all of your doctors and caregivers. You should call the office at the above phone number and make an appointment for approximately two weeks after the date of your surgery. °Do not remove your surgical dressing. The dressing is waterproof; you may take showers in 3 days, but do not take tub baths or submerge the dressing. °Please pick up a stool softener and laxative for home use as long as you are requiring pain medications. °· ICE to the affected knee every three hours for 30 minutes at a time and then as needed for pain and swelling.  Continue to use ice on the knee for pain and swelling from surgery. You may notice swelling that will progress down to the foot and ankle.  This is normal after surgery.  Elevate the leg when you are not up walking on it.   °It is important for you to complete the blood thinner medication as prescribed by your doctor. °· Continue to use the breathing machine which will help keep your temperature down.  It is common for your temperature to cycle up and down following surgery, especially at night when you are not up moving around and exerting yourself.  The breathing machine keeps your lungs expanded and your temperature down. ° °RANGE OF MOTION AND STRENGTHENING EXERCISES  °Rehabilitation of the knee is important following   a knee injury or an operation. After just a few days of immobilization, the muscles of the thigh which control the knee become weakened and shrink (atrophy). Knee exercises are designed to build up the tone and strength of the thigh muscles and to improve knee motion. Often times heat used for twenty to thirty minutes before working out will loosen up your tissues and help with improving the range of motion but do not use heat for the first two weeks following  surgery. These exercises can be done on a training (exercise) mat, on the floor, on a table or on a bed. Use what ever works the best and is most comfortable for you Knee exercises include:  °Leg Lifts - While your knee is still immobilized in a splint or cast, you can do straight leg raises. Lift the leg to 60 degrees, hold for 3 sec, and slowly lower the leg. Repeat 10-20 times 2-3 times daily. Perform this exercise against resistance later as your knee gets better.  °Quad and Hamstring Sets - Tighten up the muscle on the front of the thigh (Quad) and hold for 5-10 sec. Repeat this 10-20 times hourly. Hamstring sets are done by pushing the foot backward against an object and holding for 5-10 sec. Repeat as with quad sets.  °A rehabilitation program following serious knee injuries can speed recovery and prevent re-injury in the future due to weakened muscles. Contact your doctor or a physical therapist for more information on knee rehabilitation.  ° °SKILLED REHAB INSTRUCTIONS: °If the patient is transferred to a skilled rehab facility following release from the hospital, a list of the current medications will be sent to the facility for the patient to continue.  When discharged from the skilled rehab facility, please have the facility set up the patient's Home Health Physical Therapy prior to being released. Also, the skilled facility will be responsible for providing the patient with their medications at time of release from the facility to include their pain medication, the muscle relaxants, and their blood thinner medication. If the patient is still at the rehab facility at time of the two week follow up appointment, the skilled rehab facility will also need to assist the patient in arranging follow up appointment in our office and any transportation needs. ° °MAKE SURE YOU:  °Understand these instructions.  °Will watch your condition.  °Will get help right away if you are not doing well or get worse.   ° ° °Pick up stool softner and laxative for home use following surgery while on pain medications. °Do NOT remove your dressing. You may shower.  °Do not take tub baths or submerge incision under water. °May shower starting three days after surgery. °Please use a clean towel to pat the incision dry following showers. °Continue to use ice for pain and swelling after surgery. °Do not use any lotions or creams on the incision until instructed by your surgeon. ° °

## 2017-08-04 NOTE — Anesthesia Procedure Notes (Signed)
Spinal  Patient location during procedure: OR Staffing Anesthesiologist: Lyndle Herrlich, MD Spinal Block Patient position: sitting Prep: DuraPrep Patient monitoring: heart rate, blood pressure and continuous pulse ox Approach: right paramedian Location: L3-4 Injection technique: single-shot Needle Needle type: Sprotte  Needle gauge: 24 G Needle length: 9 cm Assessment Sensory level: T4 Additional Notes Spinal Dosage in OR   20mg  Lidocaine SAB plus .75% Bupivicaine ml       1.9

## 2017-08-04 NOTE — Interval H&P Note (Signed)
History and Physical Interval Note:  08/04/2017 12:22 PM  [name] [name]  has presented today for surgery, with the diagnosis of Degenerative joint disease right knee  The various methods of treatment have been discussed with the patient and family. After consideration of risks, benefits and other options for treatment, the patient has consented to  Procedure(s) with comments: RIGHT TOTAL KNEE ARTHROPLASTY WITH COMPUTER NAVIGATION (Right) - Needs RNFA as a surgical intervention .  The patient's history has been reviewed, patient examined, no change in status, stable for surgery.  I have reviewed the patient's chart and labs.  Questions were answered to the patient's satisfaction.     Hilton Cork Malerie Eakins

## 2017-08-04 NOTE — Op Note (Signed)
OPERATIVE REPORT  SURGEON: Rod Can, MD   ASSISTANT: Staff.  PREOPERATIVE DIAGNOSIS: Right knee arthritis.   POSTOPERATIVE DIAGNOSIS: Right knee arthritis.   PROCEDURE: Right total knee arthroplasty.   IMPLANTS: Stryker Triathlon CR femur, size 8. Stryker Tritanium tibia, size 8. X3 polyethelyene insert, size 11 mm, CR. 3 button asymmetric patella, size 40 mm.  ANESTHESIA:  Regional and Spinal  TOURNIQUET TIME: Not utilized.   ESTIMATED BLOOD LOSS:-500 mL    ANTIBIOTICS: 2 g Ancef.  DRAINS: None.  COMPLICATIONS: None   CONDITION: PACU - hemodynamically stable.   BRIEF CLINICAL NOTE: [name] [name] is a 71 y.o. male with a long-standing history of Right knee arthritis. After failing conservative management, the patient was indicated for total knee arthroplasty. The risks, benefits, and alternatives to the procedure were explained, and the patient elected to proceed.  PROCEDURE IN DETAIL: Adductor canal block was obtained in the pre-op holding area. Once inside the operative room, spinal anesthesia was obtained, and a foley catheter was inserted. The patient was then positioned, a nonsterile tourniquet was placed, and the lower extremity was prepped and draped in the normal sterile surgical fashion. A time-out was called verifying side and site of surgery. The patient received IV antibiotics within 60 minutes of beginning the procedure. The tourniquet was not utilized.  An anterior approach to the knee was performed utilizing a midvastus arthrotomy. A medial release was performed and the patellar fat pad was excised. Stryker navigation was used to cut the distal femur perpendicular to the mechanical axis. A freehand patellar resection was performed, and the patella was sized an prepared with 3 lug holes.  Nagivation was used to make a neutral proximal tibia resection, taking  6 mm of bone from the less affected medial side with 3 degrees of slope. The menisci were excised. A spacer block was placed, and the alignment and balance in extension were confirmed.   The distal femur was sized using the 3-degree external rotation guide referencing the posterior femoral cortex. The appropriate 4-in-1 cutting block was pinned into place. Rotation was checked using Whiteside's line, the epicondylar axis, and then confirmed with a spacer block in flexion. The remaining femoral cuts were performed, taking care to protect the MCL.  The tibia was sized and the trial tray was pinned into place. The remaining trail components were inserted. The knee was stable to varus and valgus stress through a full range of motion. The patella tracked centrally, and the PCL was well balanced. The trial components were removed, and the proximal tibial surface was prepared. Final components were impacted into place. The knee was tested for a final time and found to be well balanced.  The wound was copiously irrigated with a dilute betadine solution followed by normal saline with pulse lavage. Marcaine solution was injected into the periarticular soft tissue. The wound was closed in layers using #1 Vicryl and Stratafix for the fascia, 2-0 Vicryl for the subcutaneous fat, 2-0 Monocryl for the deep dermal layer, 3-0 running Monocryl subcuticular Stitch, and Dermabond for the skin. Once the glue was fully dried, an Aquacell Ag and compressive dressing were applied. Tthe patient was transported to the recovery room in stable condition. Sponge, needle, and instrument counts were correct at the end of the case x2. The patient tolerated the procedure well and there were no known complications.

## 2017-08-04 NOTE — Anesthesia Procedure Notes (Signed)
Anesthesia Regional Block: Adductor canal block   Pre-Anesthetic Checklist: ,, timeout performed, Correct Patient, Correct Site, Correct Laterality, Correct Procedure, Correct Position, site marked, Risks and benefits discussed, pre-op evaluation,  At surgeon's request and post-op pain management  Laterality: Right  Prep: chloraprep       Needles:   Needle Type: Echogenic Needle     Needle Length: 9cm  Needle Gauge: 21     Additional Needles:   Procedures:,,,, ultrasound used (permanent image in chart),,,,  Narrative:  Start time: 08/04/2017 12:01 PM End time: 08/04/2017 12:10 PM Injection made incrementally with aspirations every 5 mL. Anesthesiologist: Lyndle Herrlich, MD

## 2017-08-04 NOTE — Transfer of Care (Signed)
Immediate Anesthesia Transfer of Care Note  Patient: Moises Blood  Procedure(s) Performed: RIGHT TOTAL KNEE ARTHROPLASTY WITH COMPUTER NAVIGATION (Right Knee)  Patient Location: PACU  Anesthesia Type:Spinal  Level of Consciousness: awake, alert  and oriented  Airway & Oxygen Therapy: Patient Spontanous Breathing and Patient connected to face mask oxygen  Post-op Assessment: Report given to RN  Post vital signs: Reviewed and stable  Last Vitals:  Vitals:   08/04/17 1223 08/04/17 1224  BP:    Pulse: (!) 48 (!) 56  Resp: 13 12  Temp:    SpO2: 100% 100%    Last Pain:  Vitals:   08/04/17 0955  TempSrc: Oral      Patients Stated Pain Goal: 4 (87/57/97 2820)  Complications: No apparent anesthesia complications

## 2017-08-05 ENCOUNTER — Encounter (HOSPITAL_COMMUNITY): Payer: Self-pay | Admitting: Orthopedic Surgery

## 2017-08-05 LAB — CBC
HCT: 25.4 % — ABNORMAL LOW (ref 39.0–52.0)
Hemoglobin: 9 g/dL — ABNORMAL LOW (ref 13.0–17.0)
MCH: 33.3 pg (ref 26.0–34.0)
MCHC: 35.4 g/dL (ref 30.0–36.0)
MCV: 94.1 fL (ref 78.0–100.0)
PLATELETS: 193 10*3/uL (ref 150–400)
RBC: 2.7 MIL/uL — ABNORMAL LOW (ref 4.22–5.81)
RDW: 12.5 % (ref 11.5–15.5)
WBC: 12.3 10*3/uL — AB (ref 4.0–10.5)

## 2017-08-05 LAB — BASIC METABOLIC PANEL
Anion gap: 8 (ref 5–15)
BUN: 20 mg/dL (ref 6–20)
CALCIUM: 8.7 mg/dL — AB (ref 8.9–10.3)
CO2: 25 mmol/L (ref 22–32)
Chloride: 101 mmol/L (ref 101–111)
Creatinine, Ser: 1.31 mg/dL — ABNORMAL HIGH (ref 0.61–1.24)
GFR calc Af Amer: >60 mL/min (ref >60)
GFR, EST NON AFRICAN AMERICAN: 53 mL/min — AB (ref >60)
GLUCOSE: 163 mg/dL — AB (ref 65–99)
Potassium: 4 mmol/L (ref 3.5–5.1)
Sodium: 134 mmol/L — ABNORMAL LOW (ref 135–145)

## 2017-08-05 LAB — GLUCOSE, CAPILLARY
GLUCOSE-CAPILLARY: 128 mg/dL — AB (ref 65–99)
GLUCOSE-CAPILLARY: 145 mg/dL — AB (ref 65–99)

## 2017-08-05 MED ORDER — ASPIRIN 81 MG PO CHEW: 81.0000 mg | CHEWABLE_TABLET | Freq: Two times a day (BID) | ORAL | 0 refills | Status: DC

## 2017-08-05 MED ORDER — HYDROCODONE-ACETAMINOPHEN 5-325 MG PO TABS: 1.0000 | ORAL_TABLET | Freq: Four times a day (QID) | ORAL | 0 refills | Status: DC | PRN

## 2017-08-05 MED ORDER — ONDANSETRON HCL 4 MG PO TABS: 4.0000 mg | ORAL_TABLET | Freq: Four times a day (QID) | ORAL | 0 refills | Status: DC | PRN

## 2017-08-05 MED ORDER — SENNA 8.6 MG PO TABS: 2.0000 | ORAL_TABLET | Freq: Every day | ORAL | 0 refills | Status: DC

## 2017-08-05 NOTE — Discharge Summary (Signed)
Physician Discharge Summary  Patient ID: [name] [name] MRN: [id number] DOB/AGE: 1946-07-28 71 y.o.  Admit date: 08/04/2017 Discharge date: 08/05/2017  Admission Diagnoses:  Osteoarthritis of right knee  Discharge Diagnoses:  Principal Problem:   Osteoarthritis of right knee   Past Medical History:  Diagnosis Date  . Arthritis    both knees  . Diabetes mellitus without complication ([place])    type II  . Headache   . Hypertension     Surgeries: Procedure(s): RIGHT TOTAL KNEE ARTHROPLASTY WITH COMPUTER NAVIGATION on 08/04/2017   Consultants (if any):   Discharged Condition: Improved  Hospital Course: [name] [name] is an 71 y.o. male who was admitted 08/04/2017 with a diagnosis of Osteoarthritis of right knee and went to the operating room on 08/04/2017 and underwent the above named procedures.    He was given perioperative antibiotics:  Anti-infectives (From admission, onward)   Start     Dose/Rate Route Frequency Ordered Stop   08/04/17 2000  ceFAZolin (ANCEF) IVPB 2g/100 mL premix     2 g 200 mL/hr over 30 Minutes Intravenous Every 6 hours 08/04/17 1707 08/05/17 0348   08/04/17 0959  ceFAZolin (ANCEF) IVPB 2g/100 mL premix     2 g 200 mL/hr over 30 Minutes Intravenous On call to O.R. 08/04/17 4196 08/04/17 1316    .  He was given sequential compression devices, early ambulation, and ASA for DVT prophylaxis.  He benefited maximally from the hospital stay and there were no complications.    Recent vital signs:  Vitals:   08/05/17 0117 08/05/17 0554  BP: 136/81 133/76  Pulse: 80 64  Resp: 16 15  Temp: 98.3 F (36.8 C) 98.6 F (37 C)  SpO2: 100% 100%    Recent laboratory studies:  Lab Results  Component Value Date   HGB 9.0 (L) 08/05/2017   HGB 11.5 (L) 07/29/2017   Lab Results  Component Value Date   WBC 12.3 (H) 08/05/2017   PLT 193 08/05/2017   No results found for: INR Lab Results  Component Value Date   NA 134 (L) 08/05/2017   K 4.0  08/05/2017   CL 101 08/05/2017   CO2 25 08/05/2017   BUN 20 08/05/2017   CREATININE 1.31 (H) 08/05/2017   GLUCOSE 163 (H) 08/05/2017    Discharge Medications:   Allergies as of 08/05/2017   No Known Allergies     Medication List    STOP taking these medications   acetaminophen 325 MG tablet Commonly known as:  TYLENOL     TAKE these medications   amLODipine 10 MG tablet Commonly known as:  NORVASC Take 10 mg by mouth daily.   aspirin 81 MG chewable tablet Chew 1 tablet (81 mg total) by mouth 2 (two) times daily.   atorvastatin 80 MG tablet Commonly known as:  LIPITOR Take 80 mg by mouth daily.   docusate sodium 50 MG capsule Commonly known as:  COLACE Take 50 mg by mouth 2 (two) times daily.   finasteride 5 MG tablet Commonly known as:  PROSCAR Take 5 mg by mouth daily.   glucose blood test strip 1 each by Other route as needed for other. Use as instructed   hydrochlorothiazide 25 MG tablet Commonly known as:  HYDRODIURIL Take 25 mg by mouth daily.   HYDROcodone-acetaminophen 5-325 MG tablet Commonly known as:  NORCO/VICODIN Take 1-2 tablets by mouth every 6 (six) hours as needed (knee pain).   insulin regular 100 units/mL injection Commonly known as:  NOVOLIN R,HUMULIN  R Inject 100 Units into the skin 3 (three) times daily before meals.   lisinopril 40 MG tablet Commonly known as:  PRINIVIL,ZESTRIL Take 40 mg by mouth daily.   metFORMIN 1000 MG tablet Commonly known as:  GLUCOPHAGE Take 1,000 mg by mouth 2 (two) times daily with a meal.   ondansetron 4 MG tablet Commonly known as:  ZOFRAN Take 1 tablet (4 mg total) by mouth every 6 (six) hours as needed for nausea.   senna 8.6 MG Tabs tablet Commonly known as:  SENOKOT Take 2 tablets (17.2 mg total) by mouth at bedtime.   sildenafil 100 MG tablet Commonly known as:  VIAGRA Take 100 mg by mouth daily as needed for erectile dysfunction.            Durable Medical Equipment  (From  admission, onward)        Start     Ordered   08/04/17 1708  DME Walker rolling  Once    Question:  Patient needs a walker to treat with the following condition  Answer:  S/P total knee replacement, right   08/04/17 1707   08/04/17 1708  DME 3 n 1  Once     08/04/17 1707      Diagnostic Studies: Dg Knee Right Port  Result Date: 08/04/2017 CLINICAL DATA:  Postop day 0 right total knee arthroplasty. EXAM: PORTABLE RIGHT KNEE - 1-2 VIEW COMPARISON:  None. FINDINGS: Right total knee arthroplasty with anatomic alignment. No acute complicating features. Gas within the joint space and the adjacent soft tissues as expected immediately postoperatively IMPRESSION: Anatomic alignment post right total knee arthroplasty without acute complicating features. Electronically Signed   By: Evangeline Dakin M.D.   On: 08/04/2017 16:42    Disposition: Final discharge disposition not confirmed  Discharge Instructions    Call MD / Call 911   Complete by:  As directed    If you experience chest pain or shortness of breath, CALL 911 and be transported to the hospital emergency room.  If you develope a fever above 101 F, pus (white drainage) or increased drainage or redness at the wound, or calf pain, call your surgeon's office.   Constipation Prevention   Complete by:  As directed    Drink plenty of fluids.  Prune juice may be helpful.  You may use a stool softener, such as Colace (over the counter) 100 mg twice a day.  Use MiraLax (over the counter) for constipation as needed.   Diet - low sodium heart healthy   Complete by:  As directed    Do not put a pillow under the knee. Place it under the heel.   Complete by:  As directed    Driving restrictions   Complete by:  As directed    No driving for 6 weeks   Increase activity slowly as tolerated   Complete by:  As directed    Lifting restrictions   Complete by:  As directed    No lifting for 6 weeks   TED hose   Complete by:  As directed    Use  stockings (TED hose) for 2 weeks on both leg(s).  You may remove them at night for sleeping.      Follow-up Information    Romesha Scherer, Aaron Edelman, MD. Schedule an appointment as soon as possible for a visit in 2 weeks.   Specialty:  Orthopedic Surgery Why:  For wound re-check Contact information: [address] [place] [address] [phone]  Signed: Hilton Cork Browning [place] 08/05/2017, 7:08 AM

## 2017-08-05 NOTE — Evaluation (Signed)
Occupational Therapy Evaluation Patient Details Name: [name] [name] MRN: [id number] DOB: 04-29-1946 Today's Date: 08/05/2017    History of Present Illness s/p R TKA   Clinical Impression   This 71 year old man was admitted for the above sx. All education was completed. No further OT is needed at this time     Follow Up Recommendations  No OT follow up;Supervision/Assistance - 24 hour    Equipment Recommendations  3 in 1 bedside commode    Recommendations for Other Services       Precautions / Restrictions Precautions Precautions: Knee;Fall Restrictions Weight Bearing Restrictions: No      Mobility Bed Mobility Overal bed mobility: Modified Independent             General bed mobility comments: HOB 20 degrees  Transfers Overall transfer level: Needs assistance Equipment used: Rolling walker (2 wheeled) Transfers: Sit to/from Stand Sit to Stand: Min guard         General transfer comment: cues for UE/LE placement    Balance                                           ADL either performed or assessed with clinical judgement   ADL Overall ADL's : Needs assistance/impaired Eating/Feeding: Independent   Grooming: Wash/dry hands;Wash/dry face;Supervision/safety;Standing   Upper Body Bathing: Set up;Sitting   Lower Body Bathing: Minimal assistance;Sit to/from stand   Upper Body Dressing : Set up;Sitting   Lower Body Dressing: Moderate assistance;Sit to/from stand   Toilet Transfer: Min guard;Ambulation;BSC;Comfort height toilet;RW;Grab bars             General ADL Comments: pt needed cues to extend correct leg when sitting.  More independent with 3;1 over commode, and he is agreeable to this.  Recommended he have HHPT practice tub transfer with him for safety     Vision         Perception     Praxis      Pertinent Vitals/Pain Pain Assessment: Faces Faces Pain Scale: Hurts little more Pain Location: L knee Pain  Descriptors / Indicators: Sore Pain Intervention(s): Limited activity within patient's tolerance;Monitored during session;Premedicated before session;Repositioned;Ice applied     Hand Dominance     Extremity/Trunk Assessment Upper Extremity Assessment Upper Extremity Assessment: Overall WFL for tasks assessed           Communication Communication Communication: HOH   Cognition Arousal/Alertness: Awake/alert Behavior During Therapy: WFL for tasks assessed/performed Overall Cognitive Status: Within Functional Limits for tasks assessed                                     General Comments       Exercises     Shoulder Instructions      Home Living Family/patient expects to be discharged to:: Private residence Living Arrangements: Spouse/significant other Available Help at Discharge: Family               Bathroom Shower/Tub: Teacher, early years/pre: Handicapped height                Prior Functioning/Environment Level of Independence: Independent                 OT Problem List:        OT Treatment/Interventions:  OT Goals(Current goals can be found in the care plan section) Acute Rehab OT Goals Patient Stated Goal: home today OT Goal Formulation: All assessment and education complete, DC therapy  OT Frequency:     Barriers to D/C:            Co-evaluation              AM-PAC PT "6 Clicks" Daily Activity     Outcome Measure Help from another person eating meals?: None Help from another person taking care of personal grooming?: A Little Help from another person toileting, which includes using toliet, bedpan, or urinal?: A Little Help from another person bathing (including washing, rinsing, drying)?: A Little Help from another person to put on and taking off regular upper body clothing?: A Little Help from another person to put on and taking off regular lower body clothing?: A Lot 6 Click Score: 18   End  of Session    Activity Tolerance: Patient tolerated treatment well Patient left: in chair;with call bell/phone within reach  OT Visit Diagnosis: Pain Pain - Right/Left: Right Pain - part of body: Knee                Time: 0828-0903 OT Time Calculation (min): 35 min Charges:  OT General Charges $OT Visit: 1 Visit OT Evaluation $OT Eval Low Complexity: 1 Low G-Codes:     Robins, OTR/L [phone] 08/05/2017  Olof Marcil 08/05/2017, 10:22 AM

## 2017-08-05 NOTE — Progress Notes (Signed)
   Subjective:  Patient reports pain as mild to moderate.  Denies N/V/CP/SOB.  Objective:   VITALS:   Vitals:   08/04/17 1700 08/04/17 2146 08/05/17 0117 08/05/17 0554  BP: 125/74 137/79 136/81 133/76  Pulse: (!) 58 (!) 57 80 64  Resp: 15 17 16 15   Temp: 97.8 F (36.6 C) 97.7 F (36.5 C) 98.3 F (36.8 C) 98.6 F (37 C)  TempSrc: Oral Oral Oral Oral  SpO2: 100% 100% 100% 100%  Weight: 112 kg (247 lb)     Height: 6\' 3"  (1.905 m)       NAD ABD soft Sensation intact distally Intact pulses distally Dorsiflexion/Plantar flexion intact Incision: dressing C/D/I Compartment soft   Lab Results  Component Value Date   WBC 12.3 (H) 08/05/2017   HGB 9.0 (L) 08/05/2017   HCT 25.4 (L) 08/05/2017   MCV 94.1 08/05/2017   PLT 193 08/05/2017   BMET    Component Value Date/Time   NA 134 (L) 08/05/2017 0600   K 4.0 08/05/2017 0600   CL 101 08/05/2017 0600   CO2 25 08/05/2017 0600   GLUCOSE 163 (H) 08/05/2017 0600   BUN 20 08/05/2017 0600   CREATININE 1.31 (H) 08/05/2017 0600   CALCIUM 8.7 (L) 08/05/2017 0600   GFRNONAA 53 (L) 08/05/2017 0600   GFRAA >60 08/05/2017 0600     Assessment/Plan: 1 Day Post-Op   Principal Problem:   Osteoarthritis of right knee  WBAT with walker ASA, SCDs, TEDs PO pain control PT/OT Dispo: D/C home with HHPT   Hilton Cork Chason Mciver 08/05/2017, 7:05 AM   Rod Can, MD Cell [phone]

## 2017-08-05 NOTE — Progress Notes (Signed)
Inpatient Diabetes Program Recommendations  AACE/ADA: New Consensus Statement on Inpatient Glycemic Control (2015)  Target Ranges:  Prepandial:   less than 140 mg/dL      Peak postprandial:   less than 180 mg/dL (1-2 hours)      Critically ill patients:  140 - 180 mg/dL   Lab Results  Component Value Date   GLUCAP 145 (H) 08/05/2017    Review of Glycemic Control  Spoke with pt regarding his insulin at home. Pt states he takes Novolog 20 units every evening. Only checks blood sugars every 3-4 days. Encouraged him to check blood sugars at least daily and take blood glucose log to MD for review.  Pt voiced understanding.  Thank you. Lorenda Peck, RD, LDN, CDE Inpatient Diabetes Coordinator [phone]

## 2017-08-05 NOTE — Evaluation (Signed)
Physical Therapy Evaluation Patient Details Name: [name] [name] MRN: [id number] DOB: February 24, 1946 Today's Date: 08/05/2017   History of Present Illness  s/p R TKA  Clinical Impression  Pt s/p R TKR and presents with decreased R LE strength/ROM and post op pain limiting functional mobility.  Pt should progress to dc home with family assist.    Follow Up Recommendations Home health PT    Equipment Recommendations  Rolling walker with 5" wheels    Recommendations for Other Services       Precautions / Restrictions Precautions Precautions: Knee;Fall Restrictions Weight Bearing Restrictions: No      Mobility  Bed Mobility Overal bed mobility: Modified Independent             General bed mobility comments: Pt OOB and requests back to chair  Transfers Overall transfer level: Needs assistance Equipment used: Rolling walker (2 wheeled) Transfers: Sit to/from Stand Sit to Stand: Min guard         General transfer comment: cues for UE/LE placement  Ambulation/Gait Ambulation/Gait assistance: Min guard Ambulation Distance (Feet): 147 Feet Assistive device: Rolling walker (2 wheeled) Gait Pattern/deviations: Step-to pattern;Step-through pattern;Decreased step length - right;Decreased step length - left;Shuffle;Trunk flexed Gait velocity: decr Gait velocity interpretation: Below normal speed for age/gender General Gait Details: cues for posture, position from RW and initial sequence  Stairs            Wheelchair Mobility    Modified Rankin (Stroke Patients Only)       Balance                                             Pertinent Vitals/Pain Pain Assessment: 0-10 Pain Score: 4  Faces Pain Scale: Hurts little more Pain Location: L knee Pain Descriptors / Indicators: Sore Pain Intervention(s): Limited activity within patient's tolerance;Monitored during session;Premedicated before session;Ice applied    Home Living Family/patient  expects to be discharged to:: Private residence Living Arrangements: Spouse/significant other Available Help at Discharge: Family Type of Home: House Home Access: Stairs to enter Entrance Stairs-Rails: Psychiatric nurse of Steps: 1 at back vs 3 at front [place]: One level [place]: Kasandra Knudsen - single point;Crutches      Prior Function Level of Independence: Independent               Hand Dominance        Extremity/Trunk Assessment   Upper Extremity Assessment Upper Extremity Assessment: Overall WFL for tasks assessed    Lower Extremity Assessment Lower Extremity Assessment: LLE deficits/detail LLE Deficits / Details: 3-/5 quads with AAROM at knee -10 - 80    Cervical / Trunk Assessment Cervical / Trunk Assessment: Normal  Communication   Communication: HOH  Cognition Arousal/Alertness: Awake/alert Behavior During Therapy: WFL for tasks assessed/performed Overall Cognitive Status: Within Functional Limits for tasks assessed                                        General Comments      Exercises Total Joint Exercises Ankle Circles/Pumps: AROM;Both;20 reps;Supine Quad Sets: AROM;Both;Supine;15 reps Heel Slides: AAROM;Right;15 reps;Supine Straight Leg Raises: AAROM;Right;15 reps;Supine   Assessment/Plan    PT Assessment Patient needs continued PT services  PT Problem List Decreased strength;Decreased range of motion;Decreased activity tolerance;Decreased mobility;Decreased knowledge of  use of DME;Pain       PT Treatment Interventions DME instruction;Gait training;Therapeutic activities;Functional mobility training;Stair training;Therapeutic exercise;Patient/family education    PT Goals (Current goals can be found in the Care Plan section)  Acute Rehab PT Goals Patient Stated Goal: home today PT Goal Formulation: With patient Time For Goal Achievement: 08/12/17 Potential to Achieve Goals: Good    Frequency  7X/week   Barriers to discharge        Co-evaluation               AM-PAC PT "6 Clicks" Daily Activity  Outcome Measure Difficulty turning over in bed (including adjusting bedclothes, sheets and blankets)?: A Lot Difficulty moving from lying on back to sitting on the side of the bed? : A Lot Difficulty sitting down on and standing up from a chair with arms (e.g., wheelchair, bedside commode, etc,.)?: A Lot Help needed moving to and from a bed to chair (including a wheelchair)?: A Little Help needed walking in hospital room?: A Little Help needed climbing 3-5 steps with a railing? : A Little 6 Click Score: 15    End of Session Equipment Utilized During Treatment: Gait belt Activity Tolerance: Patient tolerated treatment well Patient left: in chair;with call bell/phone within reach Nurse Communication: Mobility status PT Visit Diagnosis: Difficulty in walking, not elsewhere classified (R26.2)    Time: 0940-1030 PT Time Calculation (min) (ACUTE ONLY): 50 min   Charges:   PT Evaluation $PT Eval Low Complexity: 1 Low PT Treatments $Gait Training: 8-22 mins $Therapeutic Exercise: 8-22 mins   PT G Codes:       Pg [phone]   [place] 08/05/2017, 10:51 AM

## 2017-08-05 NOTE — Anesthesia Postprocedure Evaluation (Signed)
Anesthesia Post Note  Patient: [name] [name]  Procedure(s) Performed: RIGHT TOTAL KNEE ARTHROPLASTY WITH COMPUTER NAVIGATION (Right Knee)     Patient location during evaluation: PACU Anesthesia Type: Spinal Level of consciousness: awake Pain management: satisfactory to patient Vital Signs Assessment: post-procedure vital signs reviewed and stable Respiratory status: spontaneous breathing Cardiovascular status: [name] pressure returned to baseline Postop Assessment: no headache and spinal receding Anesthetic complications: no    Last Vitals:  Vitals:   08/04/17 2146 08/05/17 0117  BP: 137/79 136/81  Pulse: (!) 57 80  Resp: 17 16  Temp: 36.5 C 36.8 C  SpO2: 100% 100%    Last Pain:  Vitals:   08/05/17 0117  TempSrc: Oral  PainSc:                  [name]

## 2017-08-05 NOTE — Progress Notes (Signed)
Physical Therapy Treatment Patient Details Name: [name] [name] MRN: [id number] DOB: 05-24-1946 Today's Date: 08/05/2017    History of Present Illness s/p R TKA    PT Comments    POD # 1 pm session Upon entering room, pt was amb self with walker packing his belongings.  Redirected that he needed to call for assist.  Assisted with amb in hallway and practiced stairs twice.  Pt present with impaired cognition/memory/retention.  While wlking back to room, pt stated "look as those people smoking at the end of the hall".  Assisted back to his recliner and elevated feet and applied ICE.  Applied chair alarm and redirected for safety.  Pt stated his wife works at [employer] and she will be here after 4 pm to take him home.   Follow Up Recommendations  Home health PT     Equipment Recommendations  Rolling walker with 5" wheels    Recommendations for Other Services       Precautions / Restrictions Precautions Precautions: Knee;Fall Restrictions Weight Bearing Restrictions: No    Mobility  Bed Mobility               General bed mobility comments: OOB in recliner  Transfers Overall transfer level: Needs assistance Equipment used: Rolling walker (2 wheeled) Transfers: Sit to/from Stand Sit to Stand: Supervision         General transfer comment: 75% VC's safety  Ambulation/Gait Ambulation/Gait assistance: Min guard;Supervision Ambulation Distance (Feet): 115 Feet Assistive device: Rolling walker (2 wheeled) Gait Pattern/deviations: Step-to pattern;Step-through pattern;Decreased step length - right;Decreased step length - left;Shuffle;Trunk flexed Gait velocity: decr   General Gait Details: cues for posture, position from RW and safety esp with turns    Stairs Stairs: Yes   Stair Management: No rails;Step to pattern;With walker Number of Stairs: 2 General stair comments: 50% VC's on proper tech and safety    performed twice   Wheelchair Mobility    Modified  Rankin (Stroke Patients Only)       Balance                                            Cognition Arousal/Alertness: Awake/alert Behavior During Therapy: WFL for tasks assessed/performed Overall Cognitive Status: Within Functional Limits for tasks assessed                                        Exercises      General Comments        Pertinent Vitals/Pain Pain Assessment: Faces Faces Pain Scale: Hurts a little bit Pain Location: L knee Pain Descriptors / Indicators: Sore;Operative site guarding Pain Intervention(s): Monitored during session;Repositioned;Ice applied    Home Living                      Prior Function            PT Goals (current goals can now be found in the care plan section) Progress towards PT goals: Progressing toward goals    Frequency    7X/week      PT Plan      Co-evaluation              AM-PAC PT "6 Clicks" Daily Activity  Outcome Measure  Difficulty turning over in bed (  including adjusting bedclothes, sheets and blankets)?: A Lot Difficulty moving from lying on back to sitting on the side of the bed? : A Lot Difficulty sitting down on and standing up from a chair with arms (e.g., wheelchair, bedside commode, etc,.)?: A Lot Help needed moving to and from a bed to chair (including a wheelchair)?: A Little Help needed walking in hospital room?: A Little Help needed climbing 3-5 steps with a railing? : A Little 6 Click Score: 15    End of Session Equipment Utilized During Treatment: Gait belt Activity Tolerance: Patient tolerated treatment well Patient left: in chair;with call bell/phone within reach Nurse Communication: Mobility status PT Visit Diagnosis: Difficulty in walking, not elsewhere classified (R26.2)     Time: 4920-1007 PT Time Calculation (min) (ACUTE ONLY): 17 min  Charges:  $Gait Training: 8-22 mins                    G Codes:       Rica Koyanagi  PTA [facility]   Acute  Rehab Pager      [phone]

## 2017-08-05 NOTE — Progress Notes (Signed)
Discharge plan:  Pt states he has a RW at home and needs a 3in1. Pt also has orders for HHPT. Pt has [employer] [facility]. [facility] nurse [facility] G[facility] Organ office notified of need. Both orders faxed to [place] [phone]).  Nurse states that 3in1 will be delivered to pt home by [facility] [phone]). [facility] will [facility] up [facility] will call pt at home. Marney Doctor RN,BSN,NCM [phone]

## 2017-08-09 ENCOUNTER — Emergency Department (HOSPITAL_COMMUNITY)
Admission: EM | Admit: 2017-08-09 | Discharge: 2017-08-09 | Disposition: A | Discharge status: Home / Self Care | Payer: Non-veteran care | Attending: Emergency Medicine | Admitting: Emergency Medicine

## 2017-08-09 ENCOUNTER — Encounter (HOSPITAL_COMMUNITY): Payer: Self-pay | Admitting: *Deleted

## 2017-08-09 DIAGNOSIS — Z79899 Other long term (current) drug therapy: Secondary | ICD-10-CM | POA: Diagnosis not present

## 2017-08-09 DIAGNOSIS — G8918 Other acute postprocedural pain: Secondary | ICD-10-CM | POA: Insufficient documentation

## 2017-08-09 DIAGNOSIS — Z7982 Long term (current) use of aspirin: Secondary | ICD-10-CM | POA: Diagnosis not present

## 2017-08-09 DIAGNOSIS — E119 Type 2 diabetes mellitus without complications: Secondary | ICD-10-CM | POA: Insufficient documentation

## 2017-08-09 DIAGNOSIS — Z794 Long term (current) use of insulin: Secondary | ICD-10-CM | POA: Insufficient documentation

## 2017-08-09 DIAGNOSIS — I10 Essential (primary) hypertension: Secondary | ICD-10-CM | POA: Diagnosis not present

## 2017-08-09 DIAGNOSIS — M25561 Pain in right knee: Secondary | ICD-10-CM

## 2017-08-09 DIAGNOSIS — Z96651 Presence of right artificial knee joint: Secondary | ICD-10-CM | POA: Insufficient documentation

## 2017-08-09 MED ORDER — HYDROCODONE-ACETAMINOPHEN 5-325 MG PO TABS
1.0000 | ORAL_TABLET | Freq: Once | ORAL | Status: AC
  Administered 2017-08-09: 1 via ORAL
  Filled 2017-08-09: qty 1

## 2017-08-09 NOTE — ED Provider Notes (Signed)
[facility] DEPT Provider Note   CSN: [id number] Arrival date & time: 08/09/17  3818     History   Chief Complaint Chief Complaint  Patient presents with  . Knee Pain    HPI [name] [name] is a 71 y.o. male.  The history is provided by the patient. No language interpreter was used.  Knee Pain     [name] [name] is a 71 y.o. male who presents to the Emergency Department complaining of knee pain.  He is postop day 5 following total knee arthroplasty on 1220 by Dr. Lyla Glassing.  He was discharged on postop day 1 and stated that he was doing well with pain control at that time.  He is able to ambulate around his house using his rolling walker.  He states he has had persistent poorly controlled pain since arriving at home and pain was significantly worse around 4 AM this morning and he told his wife he like to come in for evaluation.  He has not taken any pain medications today and he takes about 2 hydrocodone daily.  He denies any fevers, chest pain, shortness of breath, nausea, vomiting.  Pain is located in the anterior aspect of the right knee.  He has not noticed any drainage from the wound.  He denies any falls or injuries since hospital discharge. Past Medical History:  Diagnosis Date  . Arthritis    both knees  . Diabetes mellitus without complication ([place])    type II  . Headache   . Hypertension     Patient Active Problem List   Diagnosis Date Noted  . Osteoarthritis of right knee 08/04/2017    Past Surgical History:  Procedure Laterality Date  . COLONOSCOPY    . 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 HIP ARTHROPLASTY Right   . KNEE ARTHROPLASTY Right 08/04/2017   Procedure: RIGHT TOTAL KNEE ARTHROPLASTY WITH COMPUTER NAVIGATION;  Surgeon: Rod Can, MD;  Location: [facility] ORS;  Service: Orthopedics;  Laterality: Right;  Adductor Block  . KNEE ARTHROSCOPY Right 2004       Home Medications    Prior to Admission medications   Medication Sig  Start Date End Date Taking? Authorizing Provider  amLODipine (NORVASC) 10 MG tablet Take 10 mg by mouth daily.   Yes [provider]  aspirin 81 MG chewable tablet Chew 1 tablet (81 mg total) by mouth 2 (two) times daily. 08/05/17  Yes Swinteck, Aaron Edelman, MD  atorvastatin (LIPITOR) 80 MG tablet Take 80 mg by mouth daily.   Yes [provider]  docusate sodium (COLACE) 50 MG capsule Take 50 mg by mouth 2 (two) times daily.   Yes [provider]  finasteride (PROSCAR) 5 MG tablet Take 5 mg by mouth daily.   Yes [provider]  glucose blood test strip 1 each by Other route as needed for other. Use as instructed   Yes [provider]  hydrochlorothiazide (HYDRODIURIL) 25 MG tablet Take 25 mg by mouth daily.   Yes [provider]  HYDROcodone-acetaminophen (NORCO/VICODIN) 5-325 MG tablet Take 1-2 tablets by mouth every 6 (six) hours as needed (knee pain). 08/05/17  Yes Swinteck, Aaron Edelman, MD  insulin regular (NOVOLIN R,HUMULIN R) 100 units/mL injection Inject 100 Units into the skin at bedtime.    Yes [provider]  lisinopril (PRINIVIL,ZESTRIL) 40 MG tablet Take 40 mg by mouth daily.   Yes [provider]  metFORMIN (GLUCOPHAGE) 1000 MG tablet Take 1,000 mg by mouth 2 (  two) times daily with a meal.   Yes [provider]  ondansetron (ZOFRAN) 4 MG tablet Take 1 tablet (4 mg total) by mouth every 6 (six) hours as needed for nausea. 08/05/17  Yes Swinteck, Aaron Edelman, MD  senna (SENOKOT) 8.6 MG TABS tablet Take 2 tablets (17.2 mg total) by mouth at bedtime. 08/05/17  Yes Swinteck, Aaron Edelman, MD  sildenafil (VIAGRA) 100 MG tablet Take 100 mg by mouth daily as needed for erectile dysfunction.   Yes [provider]    Family History No family history on file.  Social History Social History   Tobacco Use  . Smoking status: Never Smoker  . Smokeless tobacco: Never Used  Substance Use Topics  . Alcohol use: Yes  . Drug use:  No     Allergies   Patient has no known allergies.   Review of Systems Review of Systems  All other systems reviewed and are negative.    Physical Exam Updated Vital Signs BP 126/68 (BP Location: Right Arm)   Pulse 61   Temp 98.7 F (37.1 C) (Oral)   Resp 18   SpO2 100%   Physical Exam  Constitutional: He is oriented to person, place, and time. He appears well-developed and well-nourished.  HENT:  Head: Normocephalic and atraumatic.  Cardiovascular: Normal rate and regular rhythm.  No murmur heard. Pulmonary/Chest: Effort normal and breath sounds normal. No respiratory distress.  Abdominal: Soft. There is no tenderness. There is no rebound and no guarding.  Musculoskeletal:  2+ DP pulses bilaterally.  There is moderate edema and ecchymosis to the right lower extremity.  Edema is located around the ankle, calf, posterior aspect of the knee.  Dressing to anterior knee is clean, dry, intact.  There is no erythema to the leg.  Neurological: He is alert and oriented to person, place, and time.  Skin: Skin is warm and dry.  Psychiatric: He has a normal mood and affect. His behavior is normal.  Nursing note and vitals reviewed.    ED Treatments / Results  Labs (all labs ordered are listed, but only abnormal results are displayed) Labs Reviewed - No data to display  EKG  EKG Interpretation None       Radiology No results found.  Procedures Procedures (including critical care time)  Medications Ordered in ED Medications  HYDROcodone-acetaminophen (NORCO/VICODIN) 5-325 MG per tablet 1 tablet (1 tablet Oral Given 08/09/17 2725)     Initial Impression / Assessment and Plan / ED Course  I have reviewed the triage vital signs and the nursing notes.  Pertinent labs & imaging results that were available during my care of the patient were reviewed by me and considered in my medical decision making (see chart for details).     Patient here for evaluation of right  knee pain following replacement surgery 5 days ago.  His knee he does have some local ecchymosis but no evidence of acute infection.  His pain is well controlled in the emergency department with his home medications.  There is concerned that patient does have some confusion that was present prior to the surgery.  Discussed this with his wife importance of PCP follow-up as he may be developing some features of dementia.  Also counseled patient and wife on home pain control with taking his pain medications as prescribed.  Also discussed elevation,  ice.  Plan to follow-up with orthopedics.  Return precautions discussed.   Presentation is not consistent with acute infection, DVT. Final Clinical Impressions(s) / ED  Diagnoses   Final diagnoses:  Acute pain of right knee    ED Discharge Orders    None       Quintella Reichert, MD 08/09/17 1630

## 2017-08-09 NOTE — ED Notes (Signed)
Bed: WA21 Expected date:  Expected time:  Means of arrival:  Comments: EMS-right knee pain

## 2017-08-09 NOTE — ED Triage Notes (Signed)
Per EMS, pt d/c'd 5 days ago w/ total right knee replacement. Pt complains of post op pain since, worse since last night. Pt wanted to be evaluated for pain that is uncontrolled by norco. Pt has dressing in place. Pt has intact PMS, denies excessive drainage. Pt has hx of DM, HTN,. Pt has not taken meds today.   CBG 224 BP 158/70 HR 70's

## 2018-10-12 ENCOUNTER — Emergency Department (HOSPITAL_COMMUNITY): Payer: No Typology Code available for payment source

## 2018-10-12 ENCOUNTER — Encounter (HOSPITAL_COMMUNITY): Payer: Self-pay

## 2018-10-12 ENCOUNTER — Emergency Department (HOSPITAL_COMMUNITY)
Admission: EM | Admit: 2018-10-12 | Discharge: 2018-10-12 | Disposition: A | Discharge status: Home / Self Care | Payer: No Typology Code available for payment source | Attending: Emergency Medicine | Admitting: Emergency Medicine

## 2018-10-12 ENCOUNTER — Other Ambulatory Visit: Payer: Self-pay

## 2018-10-12 DIAGNOSIS — E119 Type 2 diabetes mellitus without complications: Secondary | ICD-10-CM | POA: Insufficient documentation

## 2018-10-12 DIAGNOSIS — Z79899 Other long term (current) drug therapy: Secondary | ICD-10-CM | POA: Diagnosis not present

## 2018-10-12 DIAGNOSIS — R55 Syncope and collapse: Secondary | ICD-10-CM | POA: Insufficient documentation

## 2018-10-12 DIAGNOSIS — Z96641 Presence of right artificial hip joint: Secondary | ICD-10-CM | POA: Insufficient documentation

## 2018-10-12 DIAGNOSIS — Z7982 Long term (current) use of aspirin: Secondary | ICD-10-CM | POA: Diagnosis not present

## 2018-10-12 DIAGNOSIS — Z96651 Presence of right artificial knee joint: Secondary | ICD-10-CM | POA: Insufficient documentation

## 2018-10-12 DIAGNOSIS — R42 Dizziness and giddiness: Secondary | ICD-10-CM | POA: Diagnosis present

## 2018-10-12 LAB — CBC
HCT: 32.9 % — ABNORMAL LOW (ref 39.0–52.0)
Hemoglobin: 11.1 g/dL — ABNORMAL LOW (ref 13.0–17.0)
MCH: 32.9 pg (ref 26.0–34.0)
MCHC: 33.7 g/dL (ref 30.0–36.0)
MCV: 97.6 fL (ref 80.0–100.0)
Platelets: 219 10*3/uL (ref 150–400)
RBC: 3.37 MIL/uL — ABNORMAL LOW (ref 4.22–5.81)
RDW: 12.6 % (ref 11.5–15.5)
WBC: 6.8 10*3/uL (ref 4.0–10.5)
nRBC: 0 % (ref 0.0–0.2)

## 2018-10-12 LAB — COMPREHENSIVE METABOLIC PANEL
ALT: 15 U/L (ref 0–44)
AST: 20 U/L (ref 15–41)
Albumin: 4.7 g/dL (ref 3.5–5.0)
Alkaline Phosphatase: 65 U/L (ref 38–126)
Anion gap: 9 (ref 5–15)
BUN: 28 mg/dL — ABNORMAL HIGH (ref 8–23)
CO2: 25 mmol/L (ref 22–32)
CREATININE: 1.53 mg/dL — AB (ref 0.61–1.24)
Calcium: 9.2 mg/dL (ref 8.9–10.3)
Chloride: 103 mmol/L (ref 98–111)
GFR calc Af Amer: 52 mL/min — ABNORMAL LOW (ref >60)
GFR calc non Af Amer: 44 mL/min — ABNORMAL LOW (ref >60)
Glucose, Bld: 126 mg/dL — ABNORMAL HIGH (ref 70–99)
Potassium: 3.5 mmol/L (ref 3.5–5.1)
SODIUM: 137 mmol/L (ref 135–145)
Total Bilirubin: 0.4 mg/dL (ref 0.3–1.2)
Total Protein: 7.3 g/dL (ref 6.5–8.1)

## 2018-10-12 LAB — CBG MONITORING, ED: Glucose-Capillary: 114 mg/dL — ABNORMAL HIGH (ref 70–99)

## 2018-10-12 MED ORDER — SODIUM CHLORIDE 0.9 % IV BOLUS
500.0000 mL | Freq: Once | INTRAVENOUS | Status: AC
  Administered 2018-10-12: 500 mL via INTRAVENOUS

## 2018-10-12 MED ORDER — SODIUM CHLORIDE 0.9% FLUSH
3.0000 mL | Freq: Once | INTRAVENOUS | Status: AC
  Administered 2018-10-12: 3 mL via INTRAVENOUS

## 2018-10-12 MED ORDER — SODIUM CHLORIDE 0.9 % IV SOLN: INTRAVENOUS | Status: DC

## 2018-10-12 NOTE — Discharge Instructions (Signed)
Stop taking your hydrochlorothiazide.  Your blood pressure was running a little bit low today and this could be contributing to the episodes of lightheadedness.  Follow-up with your primary care doctor.  Schedule appointment with a cardiologist for further evaluation.

## 2018-10-12 NOTE — ED Provider Notes (Signed)
[facility] DEPT Provider Note   CSN: [id number] Arrival date & time: 10/12/18  1601    History   Chief Complaint Chief Complaint  Patient presents with  . Dizziness  . Weakness    HPI Cong Hudnall is a 73 y.o. male.     HPI Patient presents to the emergency room for evaluation of dizziness.  Patient states he has had these symptoms ongoing for a couple of weeks.  Patient has had intermittent episodes of feeling lightheadedness.  Patient states he notices it when he goes to stand up.  He will feel lightheaded and get dizzy.  He feels like he needs to hold onto something.  These episodes last maybe 15 to 20 minutes or so.  Denies any chest pain.  He is not having episodes of vertigo.  He states he does not get the symptoms when he is lying down and rolls over for example.  He went to see his doctor at the [place] yesterday.  The patient's family states they had to leave before he was able to get blood tests or any other testing.  His doctor wanted him to see a cardiologist.  They instructed to come to the ED to get checked out.  Patient has not had any episodes where he lost consciousness.  He is not having any difficulty with his speech.  He is not having any focal numbness or weakness.  No headaches. Past Medical History:  Diagnosis Date  . Arthritis    both knees  . Diabetes mellitus without complication ([place])    type II  . Headache   . Hypertension     Patient Active Problem List   Diagnosis Date Noted  . Osteoarthritis of right knee 08/04/2017    Past Surgical History:  Procedure Laterality Date  . COLONOSCOPY    . DG BARIUM ENEMA (Uncertain HX)    . HIP ARTHROPLASTY Right   . KNEE ARTHROPLASTY Right 08/04/2017   Procedure: RIGHT TOTAL KNEE ARTHROPLASTY WITH COMPUTER NAVIGATION;  Surgeon: Rod Can, MD;  Location: [facility];  Service: Orthopedics;  Laterality: Right;  Adductor Block  . KNEE ARTHROSCOPY Right 2004        Home Medications      Prior to Admission medications   Medication Sig Start Date End Date Taking? Authorizing Provider  amLODipine (NORVASC) 10 MG tablet Take 10 mg by mouth daily.    [provider]  aspirin 81 MG chewable tablet Chew 1 tablet (81 mg total) by mouth 2 (two) times daily. 08/05/17   Swinteck, Aaron Edelman, MD  atorvastatin (LIPITOR) 80 MG tablet Take 80 mg by mouth daily.    [provider]  docusate sodium (COLACE) 50 MG capsule Take 50 mg by mouth 2 (two) times daily.    [provider]  finasteride (PROSCAR) 5 MG tablet Take 5 mg by mouth daily.    [provider]  glucose blood test strip 1 each by Other route as needed for other. Use as instructed    [provider]  HYDROcodone-acetaminophen (NORCO/VICODIN) 5-325 MG tablet Take 1-2 tablets by mouth every 6 (six) hours as needed (knee pain). 08/05/17   Swinteck, Aaron Edelman, MD  insulin regular (NOVOLIN R,HUMULIN R) 100 units/mL injection Inject 100 Units into the skin at bedtime.     [provider]  lisinopril (PRINIVIL,ZESTRIL) 40 MG tablet Take 40 mg by mouth daily.    [provider]  metFORMIN (GLUCOPHAGE) 1000 MG tablet Take 1,000 mg by mouth  2 (two) times daily with a meal.    [provider]  ondansetron (ZOFRAN) 4 MG tablet Take 1 tablet (4 mg total) by mouth every 6 (six) hours as needed for nausea. 08/05/17   Swinteck, Aaron Edelman, MD  senna (SENOKOT) 8.6 MG TABS tablet Take 2 tablets (17.2 mg total) by mouth at bedtime. 08/05/17   Swinteck, Aaron Edelman, MD  sildenafil (VIAGRA) 100 MG tablet Take 100 mg by mouth daily as needed for erectile dysfunction.    [provider]    Family History History reviewed. No pertinent family history.  Social History Social History   Tobacco Use  . Smoking status: Never Smoker  . Smokeless tobacco: Never Used  Substance Use Topics  . Alcohol use: Yes  . Drug use: No     Allergies   Patient has no known allergies.   Review of  Systems Review of Systems  All other systems reviewed and are negative.    Physical Exam Updated Vital Signs BP 102/69   Pulse 63   Temp 97.8 F (36.6 C) (Oral)   Resp 16   Ht 1.905 m (6\' 3" )   Wt 111.1 kg   SpO2 99%   BMI 30.62 kg/m   Physical Exam Vitals signs and nursing note reviewed.  Constitutional:      General: He is not in acute distress.    Appearance: He is well-developed.  HENT:     Head: Normocephalic and atraumatic.     Right Ear: External ear normal.     Left Ear: External ear normal.  Eyes:     General: No scleral icterus.       Right eye: No discharge.        Left eye: No discharge.     Conjunctiva/sclera: Conjunctivae normal.  Neck:     Musculoskeletal: Neck supple.     Trachea: No tracheal deviation.  Cardiovascular:     Rate and Rhythm: Normal rate and regular rhythm.  Pulmonary:     Effort: Pulmonary effort is normal. No respiratory distress.     Breath sounds: Normal breath sounds. No stridor. No wheezing or rales.  Abdominal:     General: Bowel sounds are normal. There is no distension.     Palpations: Abdomen is soft.     Tenderness: There is no abdominal tenderness. There is no guarding or rebound.  Musculoskeletal:        General: No tenderness.  Skin:    General: Skin is warm and dry.     Findings: No rash.  Neurological:     Mental Status: He is alert and oriented to person, place, and time.     Cranial Nerves: No cranial nerve deficit (No facial droop, extraocular movements intact, tongue midline ).     Sensory: No sensory deficit.     Motor: No abnormal muscle tone or seizure activity.     Coordination: Coordination normal.     Comments: No pronator drift bilateral upper extrem, able to hold both legs off bed for 5 seconds, sensation intact in all extremities, no visual field cuts, no left or right sided neglect, normal finger-nose exam bilaterally, no nystagmus noted       ED Treatments / Results  Labs (all labs ordered  are listed, but only abnormal results are displayed) Labs Reviewed  CBC - Abnormal; Notable for the following components:      Result Value   RBC 3.37 (*)    Hemoglobin 11.1 (*)    HCT 32.9 (*)  All other components within normal limits  COMPREHENSIVE METABOLIC PANEL - Abnormal; Notable for the following components:   Glucose, Bld 126 (*)    BUN 28 (*)    Creatinine, Ser 1.53 (*)    GFR calc non Af Amer 44 (*)    GFR calc Af Amer 52 (*)    All other components within normal limits  CBG MONITORING, ED - Abnormal; Notable for the following components:   Glucose-Capillary 114 (*)    All other components within normal limits    EKG EKG Interpretation  Date/Time:  Thursday October 12 2018 09:56:13 EST Ventricular Rate:  80 PR Interval:    QRS Duration: 90 QT Interval:  409 QTC Calculation: 472 R Axis:   57 Text Interpretation:  sinus rhythm Anterior infarct, old Nonspecific T abnormalities, inferior leads Baseline wander in lead(s) V4 V5 V6 No old tracing to compare Confirmed by Dorie Rank [phone]) on 10/12/2018 10:26:23 AM   Radiology Dg Chest 2 View  Result Date: 10/12/2018 CLINICAL DATA:  Syncope. EXAM: CHEST - 2 VIEW COMPARISON:  None. FINDINGS: The heart size and mediastinal contours are within normal limits. Both lungs are clear. The visualized skeletal structures are unremarkable. IMPRESSION: No active cardiopulmonary disease. Electronically Signed   By: Marijo Conception, M.D.   On: 10/12/2018 10:56    Procedures Procedures (including critical care time)  Medications Ordered in ED Medications  sodium chloride 0.9 % bolus 500 mL (500 mLs Intravenous New Bag/Given 10/12/18 1051)    And  0.9 %  sodium chloride infusion (has no administration in time range)  sodium chloride flush (NS) 0.9 % injection 3 mL (3 mLs Intravenous Given 10/12/18 1052)     Initial Impression / Assessment and Plan / ED Course  I have reviewed the triage vital signs and the nursing  notes.  Pertinent labs & imaging results that were available during my care of the patient were reviewed by me and considered in my medical decision making (see chart for details).  Clinical Course as of Oct 13 1115  Thu Oct 12, 2018  1110 Patient's laboratory tests reviewed.  Mild anemia noted however this is improved compared to previous values.   [JK]  1110 Creatinine increased from previous.   [JK]    Clinical Course User Index [JK] Dorie Rank, MD    Patient presented to the emergency room for evaluation of near syncopal episodes.  Patient's physical exam is reassuring.  He has no neurologic deficits to suggest stroke or TIA.  His symptoms do not sound like vertigo but rather near syncope.  Patient's laboratory tests are reassuring although he does have a slightly increased creatinine.  Patient's blood pressures are also on the low end.  He is on Norvasc as well as hydrochlorothiazide.  I will have the patient stop the hydrochlorothiazide as there could be a prerenal component to his renal insufficiency.  Patient would benefit from further outpatient monitoring.  It is possible he could be having bradycardic episodes and may benefit from further testing such as echocardiogram and outpatient cardiac monitoring.  At this time the patient does appear stable.  No signs of acute illness.  Will discharge home with adjustment of his medications and continued outpatient management.  Final Clinical Impressions(s) / ED Diagnoses   Final diagnoses:  Near syncope    ED Discharge Orders    None       Dorie Rank, MD 10/12/18 1117

## 2018-10-12 NOTE — ED Triage Notes (Signed)
Pt presents with c/o dizziness and weakness that started on Sunday of this weekend becoming worse yesterday. Pt is alert and oriented, able to appropriately answer questions.

## 2018-10-12 NOTE — ED Notes (Signed)
Patient transported to X-ray 

## 2019-12-21 ENCOUNTER — Encounter: Payer: Self-pay | Admitting: Neurology

## 2020-03-01 ENCOUNTER — Encounter (HOSPITAL_COMMUNITY): Payer: Self-pay | Admitting: Emergency Medicine

## 2020-03-01 ENCOUNTER — Emergency Department (HOSPITAL_COMMUNITY): Payer: No Typology Code available for payment source

## 2020-03-01 ENCOUNTER — Emergency Department (HOSPITAL_COMMUNITY)
Admission: EM | Admit: 2020-03-01 | Discharge: 2020-03-02 | Disposition: A | Discharge status: Home / Self Care | Payer: No Typology Code available for payment source | Attending: Emergency Medicine | Admitting: Emergency Medicine

## 2020-03-01 DIAGNOSIS — Z7984 Long term (current) use of oral hypoglycemic drugs: Secondary | ICD-10-CM | POA: Diagnosis not present

## 2020-03-01 DIAGNOSIS — Z7982 Long term (current) use of aspirin: Secondary | ICD-10-CM | POA: Insufficient documentation

## 2020-03-01 DIAGNOSIS — E236 Other disorders of pituitary gland: Secondary | ICD-10-CM

## 2020-03-01 DIAGNOSIS — I10 Essential (primary) hypertension: Secondary | ICD-10-CM | POA: Diagnosis not present

## 2020-03-01 DIAGNOSIS — R519 Headache, unspecified: Secondary | ICD-10-CM | POA: Diagnosis present

## 2020-03-01 DIAGNOSIS — E119 Type 2 diabetes mellitus without complications: Secondary | ICD-10-CM | POA: Diagnosis not present

## 2020-03-01 DIAGNOSIS — E876 Hypokalemia: Secondary | ICD-10-CM | POA: Insufficient documentation

## 2020-03-01 DIAGNOSIS — Z79899 Other long term (current) drug therapy: Secondary | ICD-10-CM | POA: Diagnosis not present

## 2020-03-01 LAB — CBC WITH DIFFERENTIAL/PLATELET
Abs Immature Granulocytes: 0.01 10*3/uL (ref 0.00–0.07)
Basophils Absolute: 0 10*3/uL (ref 0.0–0.1)
Basophils Relative: 1 %
Eosinophils Absolute: 0.1 10*3/uL (ref 0.0–0.5)
Eosinophils Relative: 1 %
HCT: 32.5 % — ABNORMAL LOW (ref 39.0–52.0)
Hemoglobin: 11.5 g/dL — ABNORMAL LOW (ref 13.0–17.0)
Immature Granulocytes: 0 %
Lymphocytes Relative: 39 %
Lymphs Abs: 2.3 10*3/uL (ref 0.7–4.0)
MCH: 34.1 pg — ABNORMAL HIGH (ref 26.0–34.0)
MCHC: 35.4 g/dL (ref 30.0–36.0)
MCV: 96.4 fL (ref 80.0–100.0)
Monocytes Absolute: 0.4 10*3/uL (ref 0.1–1.0)
Monocytes Relative: 7 %
Neutro Abs: 3.1 10*3/uL (ref 1.7–7.7)
Neutrophils Relative %: 52 %
Platelets: 223 10*3/uL (ref 150–400)
RBC: 3.37 MIL/uL — ABNORMAL LOW (ref 4.22–5.81)
RDW: 13 % (ref 11.5–15.5)
WBC: 6 10*3/uL (ref 4.0–10.5)
nRBC: 0 % (ref 0.0–0.2)

## 2020-03-01 LAB — CBG MONITORING, ED: Glucose-Capillary: 95 mg/dL (ref 70–99)

## 2020-03-01 LAB — BASIC METABOLIC PANEL
Anion gap: 10 (ref 5–15)
BUN: 11 mg/dL (ref 8–23)
CO2: 28 mmol/L (ref 22–32)
Calcium: 8.6 mg/dL — ABNORMAL LOW (ref 8.9–10.3)
Chloride: 103 mmol/L (ref 98–111)
Creatinine, Ser: 1.16 mg/dL (ref 0.61–1.24)
GFR calc Af Amer: >60 mL/min (ref >60)
GFR calc non Af Amer: >60 mL/min (ref >60)
Glucose, Bld: 83 mg/dL (ref 70–99)
Potassium: 3 mmol/L — ABNORMAL LOW (ref 3.5–5.1)
Sodium: 141 mmol/L (ref 135–145)

## 2020-03-01 LAB — CORTISOL: Cortisol, Plasma: 11.4 ug/dL

## 2020-03-01 LAB — TSH: TSH: 1.387 u[IU]/mL (ref 0.350–4.500)

## 2020-03-01 MED ORDER — POTASSIUM CHLORIDE CRYS ER 20 MEQ PO TBCR
40.0000 meq | EXTENDED_RELEASE_TABLET | Freq: Once | ORAL | Status: AC
  Administered 2020-03-01: 40 meq via ORAL
  Filled 2020-03-01: qty 2

## 2020-03-01 MED ORDER — GADOBUTROL 1 MMOL/ML IV SOLN
10.0000 mL | Freq: Once | INTRAVENOUS | Status: AC | PRN
  Administered 2020-03-01: 10 mL via INTRAVENOUS

## 2020-03-01 MED ORDER — DEXAMETHASONE SODIUM PHOSPHATE 10 MG/ML IJ SOLN
8.0000 mg | Freq: Once | INTRAMUSCULAR | Status: AC
  Administered 2020-03-01: 8 mg via INTRAVENOUS
  Filled 2020-03-01: qty 1

## 2020-03-01 NOTE — ED Provider Notes (Signed)
°  Physical Exam  BP (!) 156/84    Pulse (!) 46    Temp 97.8 F (36.6 C) (Oral)    Resp 15    Ht 6\' 3"  (1.905 m)    Wt 108 kg    SpO2 96%    BMI 29.75 kg/m   Physical Exam  ED Course/Procedures     Procedures  MDM  Care assumed at 3 PM.  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  Patient neurologically intact and ambulatory Patient's potassium is slightly low and was replaced. Patient can follow-up with neurosurgery outpatient.     Drenda Freeze, MD 03/01/20 [phone]

## 2020-03-01 NOTE — Discharge Instructions (Addendum)
Stay hydrated   He more pains to elevate your potassium.  Repeat potassium level in a week.  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  We also sent off some lab work that neurosurgery can follow-up with.  Return to ER if you have worse headaches, vomiting, blurry vision.

## 2020-03-01 NOTE — ED Notes (Signed)
Ambulated with cane to restroom. Steady gait, tolerated well.

## 2020-03-01 NOTE — ED Triage Notes (Signed)
Pt. Stated, I was sent here from the [place] cause Ive been having severe headaches. They put my head in a scanner to take pictures and sent me here.

## 2020-03-01 NOTE — ED Provider Notes (Signed)
[facility] EMERGENCY DEPARTMENT Provider Note   CSN: [id number] Arrival date & time: 03/01/20  9678     History Chief Complaint  Patient presents with  . Headache    [name] [name] is a 74 y.o. male.  Patient is a 74 year old male who presents with a headache.  He states he woke up yesterday morning with a headache and has had a headache ever since.  He says it is eased off a little bit but still hurts.  He has a history of diabetes, hypertension, BPH.  Because of the headache, he went to the [facility] in [place] yesterday.  He had a head CT.  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  He was sent here for further evaluation.  He denies any nausea or vomiting.  No vision changes.  No speech deficits.  No numbness or weakness to his extremities.  No trouble walking or difficulty with his balance.  No recent falls.  He is not on anticoagulants.        Past Medical History:  Diagnosis Date  . Arthritis    both knees  . Diabetes mellitus without complication ([place])    type II  . Headache   . Hypertension     Patient Active Problem List   Diagnosis Date Noted  . Osteoarthritis of right knee 08/04/2017    Past Surgical History:  Procedure Laterality Date  . COLONOSCOPY    . DG BARIUM ENEMA ([place] HX)    . HIP ARTHROPLASTY Right   . KNEE ARTHROPLASTY Right 08/04/2017   Procedure: RIGHT TOTAL KNEE ARTHROPLASTY WITH COMPUTER NAVIGATION;  Surgeon: Rod Can, MD;  Location: [facility];  Service: Orthopedics;  Laterality: Right;  Adductor Block  . KNEE ARTHROSCOPY Right 2004       No family history on file.  Social History   Tobacco Use  . Smoking status: Never Smoker  . Smokeless tobacco: Never Used  Vaping Use  . Vaping Use: Never used  Substance Use Topics  . Alcohol use: Yes  . Drug use: No    Home Medications Prior to Admission medications   Medication Sig Start Date End Date  Taking? Authorizing Provider  amLODipine (NORVASC) 10 MG tablet Take 10 mg by mouth daily.   Yes [provider]  docusate sodium (COLACE) 50 MG capsule Take 50 mg by mouth 2 (two) times daily as needed for mild constipation.    Yes [provider]  insulin regular (NOVOLIN R,HUMULIN R) 100 units/mL injection Inject 72 Units into the skin at bedtime.    Yes [provider]  latanoprost (XALATAN) 0.005 % ophthalmic solution Place 1 drop into both eyes at bedtime. 08/24/18  Yes [provider]  lisinopril (PRINIVIL,ZESTRIL) 40 MG tablet Take 40 mg by mouth daily.   Yes [provider]  metFORMIN (GLUCOPHAGE) 1000 MG tablet Take 1,000 mg by mouth daily with breakfast.    Yes [provider]  ondansetron (ZOFRAN) 4 MG tablet Take 1 tablet (4 mg total) by mouth every 6 (six) hours as needed for nausea. 08/05/17  Yes Swinteck, Aaron Edelman, MD  senna (SENOKOT) 8.6 MG TABS tablet Take 2 tablets (17.2 mg total) by mouth at bedtime. Patient taking differently: Take 2 tablets by mouth at bedtime as needed for mild constipation.  08/05/17  Yes Swinteck, Aaron Edelman, MD  sildenafil (VIAGRA) 100 MG tablet Take 100 mg by mouth daily as needed for erectile dysfunction.  Yes [provider]  aspirin 81 MG chewable tablet Chew 1 tablet (81 mg total) by mouth 2 (two) times daily. Patient not taking: Reported on 03/01/2020 08/05/17   Rod Can, MD  glucose blood test strip 1 each by Other route as needed for other. Use as instructed    [provider]  HYDROcodone-acetaminophen (NORCO/VICODIN) 5-325 MG tablet Take 1-2 tablets by mouth every 6 (six) hours as needed (knee pain). Patient not taking: Reported on 03/01/2020 08/05/17   Rod Can, MD    Allergies    Patient has no known allergies.  Review of Systems   Review of Systems  Constitutional: Negative for chills, diaphoresis, fatigue and fever.  HENT: Negative for congestion, rhinorrhea and  sneezing.   Eyes: Negative.   Respiratory: Negative for cough, chest tightness and shortness of breath.   Cardiovascular: Negative for chest pain and leg swelling.  Gastrointestinal: Negative for abdominal pain, blood in stool, diarrhea, nausea and vomiting.  Genitourinary: Negative for difficulty urinating, flank pain, frequency and hematuria.  Musculoskeletal: Negative for arthralgias and back pain.  Skin: Negative for rash.  Neurological: Positive for headaches. Negative for dizziness, speech difficulty, weakness and numbness.    Physical Exam Updated Vital Signs BP (!) 156/84   Pulse (!) 46   Temp 97.8 F (36.6 C) (Oral)   Resp 15   Ht 6\' 3"  (1.905 m)   Wt 108 kg   SpO2 96%   BMI 29.75 kg/m   Physical Exam Constitutional:      Appearance: He is well-developed.  HENT:     Head: Normocephalic and atraumatic.  Eyes:     Pupils: Pupils are equal, round, and reactive to light.  Cardiovascular:     Rate and Rhythm: Normal rate and regular rhythm.     Heart sounds: Normal heart sounds.  Pulmonary:     Effort: Pulmonary effort is normal. No respiratory distress.     Breath sounds: Normal breath sounds. No wheezing or rales.  Chest:     Chest wall: No tenderness.  Abdominal:     General: Bowel sounds are normal.     Palpations: Abdomen is soft.     Tenderness: There is no abdominal tenderness. There is no guarding or rebound.  Musculoskeletal:        General: Normal range of motion.     Cervical back: Normal range of motion and neck supple.  Lymphadenopathy:     Cervical: No cervical adenopathy.  Skin:    General: Skin is warm and dry.     Findings: No rash.  Neurological:     Mental Status: He is alert and oriented to person, place, and time.     Comments: Motor 5/5 all extremities Sensation grossly intact to LT all extremities Finger to Nose intact, no pronator drift CN II-XII grossly intact Gait normal, other than limitations from his right hip replacement       ED Results / Procedures / Treatments   Labs (all labs ordered are listed, but only abnormal results are displayed) Labs Reviewed  BASIC METABOLIC PANEL - Abnormal; Notable for the following components:      Result Value   Potassium 3.0 (*)    Calcium 8.6 (*)    All other components within normal limits  CBC WITH DIFFERENTIAL/PLATELET - Abnormal; Notable for the following components:   RBC 3.37 (*)    Hemoglobin 11.5 (*)    HCT 32.5 (*)    MCH 34.1 (*)    All  other components within normal limits  CORTISOL  TSH  GROWTH HORMONE  PROLACTIN  FOLLICLE STIMULATING HORMONE  LUTEINIZING HORMONE  CBG MONITORING, ED    EKG EKG Interpretation  Date/Time:  Saturday March 01 2020 08:41:06 EDT Ventricular Rate:  59 PR Interval:    QRS Duration: 95 QT Interval:  443 QTC Calculation: 439 R Axis:   -6 Text Interpretation: Sinus rhythm Borderline prolonged PR interval Anterior infarct, old since last tracing no significant change Confirmed by Malvin Johns [phone]) on 03/01/2020 10:13:55 AM   Radiology No results found.  Procedures Procedures (including critical care time)  Medications Ordered in ED Medications  potassium chloride SA (KLOR-CON) CR tablet 40 mEq (has no administration in time range)  dexamethasone (DECADRON) injection 8 mg (8 mg Intravenous Given 03/01/20 1023)  gadobutrol (GADAVIST) 1 MMOL/ML injection 10 mL (10 mLs Intravenous Contrast Given 03/01/20 1505)    ED Course  I have reviewed the triage vital signs and the nursing notes.  Pertinent labs & imaging results that were available during my care of the patient were reviewed by me and considered in my medical decision making (see chart for details).    MDM Rules/Calculators/A&P                          Patient presents with a pituitary mass.  I spoke with Dr. Saintclair Halsted with neurosurgery who recommends getting a brain MRI with pituitary protocol.  This was ordered and is pending.  He was given Decadron as  well per Dr. Windy Carina recommendation.  I did discuss with the patient that he will need to frequently check his blood sugars given the steroid administration.  His headache has improved.  Dr. Darl Householder to follow-up on brain MRI. Final Clinical Impression(s) / ED Diagnoses Final diagnoses:  None    Rx / DC Orders ED Discharge Orders    None       Malvin Johns, MD 03/01/20 1551

## 2020-03-02 LAB — FOLLICLE STIMULATING HORMONE: FSH: 13.1 m[IU]/mL — ABNORMAL HIGH (ref 1.5–12.4)

## 2020-03-02 LAB — PROLACTIN: Prolactin: 12.8 ng/mL (ref 4.0–15.2)

## 2020-03-02 LAB — LUTEINIZING HORMONE: LH: 5 m[IU]/mL (ref 1.7–8.6)

## 2020-03-03 LAB — GROWTH HORMONE: Growth Hormone: 0.2 ng/mL (ref 0.0–10.0)

## 2020-03-18 ENCOUNTER — Encounter: Payer: Self-pay | Admitting: Neurology

## 2020-03-18 ENCOUNTER — Ambulatory Visit (INDEPENDENT_AMBULATORY_CARE_PROVIDER_SITE_OTHER): Payer: No Typology Code available for payment source | Admitting: Neurology

## 2020-03-18 ENCOUNTER — Other Ambulatory Visit: Payer: Self-pay

## 2020-03-18 VITALS — BP 115/88 | HR 90 | Ht 75.0 in | Wt 226.6 lb

## 2020-03-18 DIAGNOSIS — D352 Benign neoplasm of pituitary gland: Secondary | ICD-10-CM

## 2020-03-18 DIAGNOSIS — G3184 Mild cognitive impairment, so stated: Secondary | ICD-10-CM | POA: Diagnosis not present

## 2020-03-18 NOTE — Progress Notes (Signed)
NEUROLOGY CONSULTATION NOTE  [name] [name] MRN: [id number] DOB: 09/18/1945  Referring provider: Wynema Birch, PA-C Primary care provider: Dr. Raquel James  Reason for consult:  Memory loss   Thank you for your kind referral of [name] [name] for consultation of the above symptoms. Although his history is well known to you, please allow me to reiterate it for the purpose of our medical record. The patient is alone in the office today. His wife was contacted after the visit to obtain additional information. Records and images were personally reviewed where available.   HISTORY OF PRESENT ILLNESS: This is a 74 year old right-handed man with a history of hypertension, diabetes, referred to our office for evaluation of memory loss. 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 He is verbose, circumstantial, and slightly tangential, needing constant redirection. He states his memory is pretty good, "nothing wrong with my memory." He denies getting lost driving, forgetting medications or bills. He denies forgetting appointments, saying he puts them on the calendar. I spoke with his wife after the visit who reports memory changes started a little over a year ago. He forgets days of the week, forgets what time his appointments are. He forgets that he should take insulin at night. She noticed he was taking it in the morning and she reminded him he only needs to take it at night. She states he does not get lost driving but he forgets where places are, she had to show him how to get to a place. She states he has forgotten to pay some bills. She also notes personality changes, he accuses her of doing things she has not done. No hallucinations. There is no family history of dementia. No history of significant head injuries. He drinks alcohol, both of them say "he does not drink a lot." He states he drinks alcohol to "calm my nerves down."  He has had headaches for  quite some time, probably more than a year. He describes it as a nagging pain over the vertex, constant for the past 3 months. He has a headache all day but it does not bother him when asleep, he sleeps fine. He takes Tylenol twice a day but has not noticed much improvement. He feels "like something is trying to come out of there." When he is out in the sun for a long time, he has pain around his left eye, vision is a little blurred for a brief period. He denies any nausea/vomiting, diplopia, dysarthria/dysphagia, focal numbness/tingling/weakness, bowel/bladder dysfunction. He has neck and back pain. He repeatedly complains of right knee pain.   I personally reviewed MRI brain with and without contrast done 03/01/2020 which showed a hypoenhancing sellar/suprasellar mass 1.8 x 1.7 x 2.1 cm with normal appearing displaced pituitary tissue at the right aspect of the sella. Suprasellar component abuts and displaces the prechiasmatic optic nerves. No definite cavernous sinus invasion. 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 There is generalized volume loss, mild chronic microvascular disease.   Laboratory Data: GH, prolactin, TSH, LH, cortisol normal. FSH 13.1 (ref 1.5-12.4)  PAST MEDICAL HISTORY: Past Medical History:  Diagnosis Date  . Arthritis    both knees  . Diabetes mellitus without complication ([place])    type II  . Headache   . Hypertension     PAST SURGICAL HISTORY: Past Surgical History:  Procedure Laterality Date  . COLONOSCOPY    .  DG BARIUM ENEMA ([place] [facility])    . HIP ARTHROPLASTY Right   . KNEE ARTHROPLASTY Right 08/04/2017   Procedure: RIGHT TOTAL KNEE ARTHROPLASTY WITH COMPUTER NAVIGATION;  Surgeon: Rod Can, MD;  Location: [facility];  Service: Orthopedics;  Laterality: Right;  Adductor Block  . KNEE ARTHROSCOPY Right 2004    MEDICATIONS: Current Outpatient Medications on File Prior to  Visit  Medication Sig Dispense Refill  . amLODipine (NORVASC) 10 MG tablet Take 10 mg by mouth daily.    Marland Kitchen aspirin 81 MG chewable tablet Chew 1 tablet (81 mg total) by mouth 2 (two) times daily. 60 tablet 0  . docusate sodium (COLACE) 50 MG capsule Take 50 mg by mouth 2 (two) times daily as needed for mild constipation.     Marland Kitchen glucose [name] test strip 1 each by Other route as needed for other. Use as instructed    . insulin regular (NOVOLIN R,HUMULIN R) 100 units/mL injection Inject 72 Units into the skin at bedtime.     Marland Kitchen latanoprost (XALATAN) 0.005 % ophthalmic solution Place 1 drop into both eyes at bedtime.    Marland Kitchen lisinopril (PRINIVIL,ZESTRIL) 40 MG tablet Take 40 mg by mouth daily.    . metFORMIN (GLUCOPHAGE) 1000 MG tablet Take 1,000 mg by mouth daily with breakfast.     . senna (SENOKOT) 8.6 MG TABS tablet Take 2 tablets (17.2 mg total) by mouth at bedtime. (Patient taking differently: Take 2 tablets by mouth at bedtime as needed for mild constipation. ) 120 each 0  . sildenafil (VIAGRA) 100 MG tablet Take 100 mg by mouth daily as needed for erectile dysfunction.     No current facility-administered medications on file prior to visit.    ALLERGIES: No Known Allergies  FAMILY HISTORY: History reviewed. No pertinent family history.  SOCIAL HISTORY: Social History   Socioeconomic History  . Marital status: Married    Spouse name: Not on file  . Number of children: Not on file  . Years of education: Not on file  . Highest education level: Not on file  Occupational History  . Not on file  Tobacco Use  . Smoking status: Never Smoker  . Smokeless tobacco: Never Used  Vaping Use  . Vaping Use: Never used  Substance and Sexual Activity  . Alcohol use: Yes    Comment: 5th will last a month   . Drug use: No  . Sexual activity: Not on file  Other Topics Concern  . Not on file  Social History Narrative   Right handed    Lives with family    Social Determinants of Health    Financial Resource Strain:   . Difficulty of Paying Living Expenses:   Food Insecurity:   . Worried About Charity fundraiser in the Last Year:   . Arboriculturist in the Last Year:   Transportation Needs:   . Film/video editor (Medical):   Marland Kitchen Lack of Transportation (Non-Medical):   Physical Activity:   . Days of Exercise per Week:   . Minutes of Exercise per Session:   Stress:   . Feeling of Stress :   Social Connections:   . Frequency of Communication with Friends and Family:   . Frequency of Social Gatherings with Friends and Family:   . Attends Religious Services:   . Active Member of Clubs or Organizations:   . Attends Archivist Meetings:   Marland Kitchen Marital Status:   Intimate Partner Violence:   .  Fear of Current or Ex-Partner:   . Emotionally Abused:   Marland Kitchen Physically Abused:   . Sexually Abused:     PHYSICAL EXAM: Vitals:   03/18/20 1240  BP: 115/88  Pulse: 90  SpO2: 98%   General: No acute distress Head:  Normocephalic/atraumatic Skin/Extremities: No rash, no edema Neurological Exam: Mental status: alert and oriented to person, place, year. Verbose, circumstantial, slightly tangential needing constant redirection. No dysarthria or aphasia, Fund of knowledge is appropriate.  Recent and remote memory are impaired.  Attention and concentration are reduced. SLUMS 12/30.  [place] Mental Exam 03/18/2020  Weekday Correct 0  Current year 1  What state are we in? 1  Amount spent 0  Amount left 0  # of Animals 3  5 objects recall 0  Number series 2  Hour markers 2  Time correct 1  Placed X in triangle correctly 1  Largest Figure 1  Name of male 0  Date back to work 0  Type of work 0  State she lived in 0  Total score 12   Cranial nerves: CN I: not tested CN II: pupils equal, round and reactive to light, visual fields intact CN III, IV, VI:  full range of motion, no nystagmus, no ptosis CN V: facial sensation intact CN VII: upper and  lower face symmetric CN VIII: hearing intact to conversation Bulk & Tone: normal, no fasciculations. Motor: 5/5 on both UE, left LE. 4-/5 right hip flexion and knee extension reporting pain, 5/5 dorsiflexion Sensation: intact to light touch, cold on both UE. Decreased cold on right LE, decreased pin around right knee. Intact vibration sense. Romberg test negative Deep Tendon Reflexes: +2 throughout Cerebellar: no incoordination on finger to nose testing Gait: narrow-based and steady, able to tandem walk adequately. Tremor: none  IMPRESSION: This is a 74 year old right-handed man with a history of hypertension, diabetes, presenting for evaluation of memory loss. He denies any memory difficulties, his wife was contacted after the visit and provided more information concerning for difficulties with complex tasks. SLUMS score 12/30. MRI brain done in the ER 2 weeks ago for headaches showed a pituitary macroadenoma. Aside from headaches, he has no other symptoms from this. Pituitary hormone panel overall unremarkable. Recommend Neurosurgery evaluation. We discussed recommendation for Neurocognitive testing. I discussed with wife that with information she provided, he may benefit from starting medication such as Donepezil, however we agreed to hold off until after Neurocognitive testing. Continue to monitor driving. Follow-up in 6 months, they know to call for any changes.   Thank you for allowing me to participate in the care of this patient. Please do not hesitate to call for any questions or concerns.   Ellouise Newer, M.D.  CC: Wynema Birch, PA-C, Dr. Hortencia Conradi

## 2020-03-18 NOTE — Patient Instructions (Signed)
We will contact the [facility] about sending the referral to the Neurosurgeon and also for Neurocognitive testing (memory testing).

## 2020-03-27 ENCOUNTER — Ambulatory Visit: Payer: 59 | Admitting: Neurology

## 2021-05-25 ENCOUNTER — Observation Stay (HOSPITAL_COMMUNITY): Payer: No Typology Code available for payment source

## 2021-05-25 ENCOUNTER — Other Ambulatory Visit: Payer: Self-pay

## 2021-05-25 ENCOUNTER — Inpatient Hospital Stay (HOSPITAL_COMMUNITY)
Admission: EM | Admit: 2021-05-25 | Discharge: 2021-07-03 | DRG: 065 | Disposition: A | Discharge status: Skilled Nursing Facility | Payer: No Typology Code available for payment source | Attending: Internal Medicine | Admitting: Internal Medicine

## 2021-05-25 ENCOUNTER — Emergency Department (HOSPITAL_COMMUNITY): Payer: No Typology Code available for payment source

## 2021-05-25 ENCOUNTER — Encounter (HOSPITAL_COMMUNITY): Payer: Self-pay | Admitting: Emergency Medicine

## 2021-05-25 DIAGNOSIS — F05 Delirium due to known physiological condition: Secondary | ICD-10-CM | POA: Diagnosis present

## 2021-05-25 DIAGNOSIS — Z87891 Personal history of nicotine dependence: Secondary | ICD-10-CM

## 2021-05-25 DIAGNOSIS — I5082 Biventricular heart failure: Secondary | ICD-10-CM | POA: Diagnosis present

## 2021-05-25 DIAGNOSIS — R338 Other retention of urine: Secondary | ICD-10-CM | POA: Diagnosis not present

## 2021-05-25 DIAGNOSIS — I340 Nonrheumatic mitral (valve) insufficiency: Secondary | ICD-10-CM | POA: Diagnosis present

## 2021-05-25 DIAGNOSIS — Z79899 Other long term (current) drug therapy: Secondary | ICD-10-CM

## 2021-05-25 DIAGNOSIS — E785 Hyperlipidemia, unspecified: Secondary | ICD-10-CM | POA: Diagnosis present

## 2021-05-25 DIAGNOSIS — D72829 Elevated white blood cell count, unspecified: Secondary | ICD-10-CM | POA: Diagnosis not present

## 2021-05-25 DIAGNOSIS — R339 Retention of urine, unspecified: Secondary | ICD-10-CM

## 2021-05-25 DIAGNOSIS — F02C2 Dementia in other diseases classified elsewhere, severe, with psychotic disturbance: Secondary | ICD-10-CM | POA: Diagnosis present

## 2021-05-25 DIAGNOSIS — R29703 NIHSS score 3: Secondary | ICD-10-CM | POA: Diagnosis present

## 2021-05-25 DIAGNOSIS — D539 Nutritional anemia, unspecified: Secondary | ICD-10-CM | POA: Diagnosis present

## 2021-05-25 DIAGNOSIS — Z66 Do not resuscitate: Secondary | ICD-10-CM | POA: Diagnosis not present

## 2021-05-25 DIAGNOSIS — G459 Transient cerebral ischemic attack, unspecified: Secondary | ICD-10-CM | POA: Diagnosis not present

## 2021-05-25 DIAGNOSIS — E861 Hypovolemia: Secondary | ICD-10-CM | POA: Diagnosis not present

## 2021-05-25 DIAGNOSIS — G8194 Hemiplegia, unspecified affecting left nondominant side: Secondary | ICD-10-CM | POA: Diagnosis present

## 2021-05-25 DIAGNOSIS — E876 Hypokalemia: Secondary | ICD-10-CM

## 2021-05-25 DIAGNOSIS — Z7984 Long term (current) use of oral hypoglycemic drugs: Secondary | ICD-10-CM

## 2021-05-25 DIAGNOSIS — Z20822 Contact with and (suspected) exposure to covid-19: Secondary | ICD-10-CM | POA: Diagnosis present

## 2021-05-25 DIAGNOSIS — I10 Essential (primary) hypertension: Secondary | ICD-10-CM

## 2021-05-25 DIAGNOSIS — E871 Hypo-osmolality and hyponatremia: Secondary | ICD-10-CM | POA: Diagnosis not present

## 2021-05-25 DIAGNOSIS — E1140 Type 2 diabetes mellitus with diabetic neuropathy, unspecified: Secondary | ICD-10-CM

## 2021-05-25 DIAGNOSIS — N136 Pyonephrosis: Secondary | ICD-10-CM | POA: Diagnosis not present

## 2021-05-25 DIAGNOSIS — E440 Moderate protein-calorie malnutrition: Secondary | ICD-10-CM | POA: Insufficient documentation

## 2021-05-25 DIAGNOSIS — R001 Bradycardia, unspecified: Secondary | ICD-10-CM | POA: Diagnosis not present

## 2021-05-25 DIAGNOSIS — Z96641 Presence of right artificial hip joint: Secondary | ICD-10-CM | POA: Diagnosis present

## 2021-05-25 DIAGNOSIS — I482 Chronic atrial fibrillation, unspecified: Secondary | ICD-10-CM | POA: Diagnosis not present

## 2021-05-25 DIAGNOSIS — Z833 Family history of diabetes mellitus: Secondary | ICD-10-CM

## 2021-05-25 DIAGNOSIS — G3183 Dementia with Lewy bodies: Secondary | ICD-10-CM | POA: Diagnosis present

## 2021-05-25 DIAGNOSIS — Z7901 Long term (current) use of anticoagulants: Secondary | ICD-10-CM | POA: Diagnosis not present

## 2021-05-25 DIAGNOSIS — K5900 Constipation, unspecified: Secondary | ICD-10-CM | POA: Diagnosis not present

## 2021-05-25 DIAGNOSIS — I2510 Atherosclerotic heart disease of native coronary artery without angina pectoris: Secondary | ICD-10-CM | POA: Diagnosis present

## 2021-05-25 DIAGNOSIS — I639 Cerebral infarction, unspecified: Principal | ICD-10-CM

## 2021-05-25 DIAGNOSIS — Z781 Physical restraint status: Secondary | ICD-10-CM

## 2021-05-25 DIAGNOSIS — I130 Hypertensive heart and chronic kidney disease with heart failure and stage 1 through stage 4 chronic kidney disease, or unspecified chronic kidney disease: Secondary | ICD-10-CM | POA: Diagnosis present

## 2021-05-25 DIAGNOSIS — I6381 Other cerebral infarction due to occlusion or stenosis of small artery: Secondary | ICD-10-CM | POA: Diagnosis not present

## 2021-05-25 DIAGNOSIS — R0681 Apnea, not elsewhere classified: Secondary | ICD-10-CM | POA: Diagnosis present

## 2021-05-25 DIAGNOSIS — Z8249 Family history of ischemic heart disease and other diseases of the circulatory system: Secondary | ICD-10-CM

## 2021-05-25 DIAGNOSIS — I420 Dilated cardiomyopathy: Secondary | ICD-10-CM | POA: Diagnosis present

## 2021-05-25 DIAGNOSIS — Z794 Long term (current) use of insulin: Secondary | ICD-10-CM

## 2021-05-25 DIAGNOSIS — E11649 Type 2 diabetes mellitus with hypoglycemia without coma: Secondary | ICD-10-CM | POA: Diagnosis not present

## 2021-05-25 DIAGNOSIS — I5022 Chronic systolic (congestive) heart failure: Secondary | ICD-10-CM | POA: Diagnosis present

## 2021-05-25 DIAGNOSIS — G9608 Other cranial cerebrospinal fluid leak: Secondary | ICD-10-CM | POA: Diagnosis present

## 2021-05-25 DIAGNOSIS — Z7982 Long term (current) use of aspirin: Secondary | ICD-10-CM

## 2021-05-25 DIAGNOSIS — N401 Enlarged prostate with lower urinary tract symptoms: Secondary | ICD-10-CM | POA: Diagnosis not present

## 2021-05-25 DIAGNOSIS — E860 Dehydration: Secondary | ICD-10-CM | POA: Diagnosis present

## 2021-05-25 DIAGNOSIS — E1122 Type 2 diabetes mellitus with diabetic chronic kidney disease: Secondary | ICD-10-CM | POA: Diagnosis present

## 2021-05-25 DIAGNOSIS — D631 Anemia in chronic kidney disease: Secondary | ICD-10-CM | POA: Diagnosis present

## 2021-05-25 DIAGNOSIS — Z96651 Presence of right artificial knee joint: Secondary | ICD-10-CM | POA: Diagnosis present

## 2021-05-25 DIAGNOSIS — R471 Dysarthria and anarthria: Secondary | ICD-10-CM | POA: Diagnosis present

## 2021-05-25 DIAGNOSIS — N179 Acute kidney failure, unspecified: Secondary | ICD-10-CM

## 2021-05-25 DIAGNOSIS — N1831 Chronic kidney disease, stage 3a: Secondary | ICD-10-CM | POA: Diagnosis present

## 2021-05-25 DIAGNOSIS — Z6824 Body mass index (BMI) 24.0-24.9, adult: Secondary | ICD-10-CM

## 2021-05-25 LAB — CBG MONITORING, ED
Glucose-Capillary: 171 mg/dL — ABNORMAL HIGH (ref 70–99)
Glucose-Capillary: 112 mg/dL — ABNORMAL HIGH (ref 70–99)

## 2021-05-25 LAB — I-STAT CHEM 8, ED
BUN: 13 mg/dL (ref 8–23)
Calcium, Ion: 1.02 mmol/L — ABNORMAL LOW (ref 1.15–1.40)
Chloride: 100 mmol/L (ref 98–111)
Creatinine, Ser: 1.6 mg/dL — ABNORMAL HIGH (ref 0.61–1.24)
Glucose, Bld: 187 mg/dL — ABNORMAL HIGH (ref 70–99)
HCT: 37 % — ABNORMAL LOW (ref 39.0–52.0)
Hemoglobin: 12.6 g/dL — ABNORMAL LOW (ref 13.0–17.0)
Potassium: 2.7 mmol/L — CL (ref 3.5–5.1)
Sodium: 138 mmol/L (ref 135–145)
TCO2: 22 mmol/L (ref 22–32)

## 2021-05-25 LAB — COMPREHENSIVE METABOLIC PANEL
ALT: 45 U/L — ABNORMAL HIGH (ref 0–44)
AST: 28 U/L (ref 15–41)
Albumin: 3.7 g/dL (ref 3.5–5.0)
Alkaline Phosphatase: 53 U/L (ref 38–126)
Anion gap: 9 (ref 5–15)
BUN: 13 mg/dL (ref 8–23)
CO2: 26 mmol/L (ref 22–32)
Calcium: 8.9 mg/dL (ref 8.9–10.3)
Chloride: 100 mmol/L (ref 98–111)
Creatinine, Ser: 1.71 mg/dL — ABNORMAL HIGH (ref 0.61–1.24)
GFR, Estimated: 41 mL/min — ABNORMAL LOW (ref >60)
Glucose, Bld: 191 mg/dL — ABNORMAL HIGH (ref 70–99)
Potassium: 2.8 mmol/L — ABNORMAL LOW (ref 3.5–5.1)
Sodium: 135 mmol/L (ref 135–145)
Total Bilirubin: 1.2 mg/dL (ref 0.3–1.2)
Total Protein: 6.3 g/dL — ABNORMAL LOW (ref 6.5–8.1)

## 2021-05-25 LAB — HEMOGLOBIN A1C
Hgb A1c MFr Bld: 5.8 % — ABNORMAL HIGH (ref 4.8–5.6)
Mean Plasma Glucose: 119.76 mg/dL

## 2021-05-25 LAB — CBC
HCT: 36.3 % — ABNORMAL LOW (ref 39.0–52.0)
Hemoglobin: 12.3 g/dL — ABNORMAL LOW (ref 13.0–17.0)
MCH: 34 pg (ref 26.0–34.0)
MCHC: 33.9 g/dL (ref 30.0–36.0)
MCV: 100.3 fL — ABNORMAL HIGH (ref 80.0–100.0)
Platelets: 211 10*3/uL (ref 150–400)
RBC: 3.62 MIL/uL — ABNORMAL LOW (ref 4.22–5.81)
RDW: 13.2 % (ref 11.5–15.5)
WBC: 7.9 10*3/uL (ref 4.0–10.5)
nRBC: 0 % (ref 0.0–0.2)

## 2021-05-25 LAB — DIFFERENTIAL
Abs Immature Granulocytes: 0.02 10*3/uL (ref 0.00–0.07)
Basophils Absolute: 0 10*3/uL (ref 0.0–0.1)
Basophils Relative: 1 %
Eosinophils Absolute: 0 10*3/uL (ref 0.0–0.5)
Eosinophils Relative: 0 %
Immature Granulocytes: 0 %
Lymphocytes Relative: 38 %
Lymphs Abs: 3 10*3/uL (ref 0.7–4.0)
Monocytes Absolute: 0.2 10*3/uL (ref 0.1–1.0)
Monocytes Relative: 3 %
Neutro Abs: 4.6 10*3/uL (ref 1.7–7.7)
Neutrophils Relative %: 58 %

## 2021-05-25 LAB — BASIC METABOLIC PANEL
Anion gap: 8 (ref 5–15)
BUN: 13 mg/dL (ref 8–23)
CO2: 25 mmol/L (ref 22–32)
Calcium: 8.9 mg/dL (ref 8.9–10.3)
Chloride: 102 mmol/L (ref 98–111)
Creatinine, Ser: 1.58 mg/dL — ABNORMAL HIGH (ref 0.61–1.24)
GFR, Estimated: 45 mL/min — ABNORMAL LOW (ref >60)
Glucose, Bld: 117 mg/dL — ABNORMAL HIGH (ref 70–99)
Potassium: 3.2 mmol/L — ABNORMAL LOW (ref 3.5–5.1)
Sodium: 135 mmol/L (ref 135–145)

## 2021-05-25 LAB — PROTIME-INR
INR: 1.3 — ABNORMAL HIGH (ref 0.8–1.2)
Prothrombin Time: 16.5 seconds — ABNORMAL HIGH (ref 11.4–15.2)

## 2021-05-25 LAB — RESP PANEL BY RT-PCR (FLU A&B, COVID) ARPGX2
Influenza A by PCR: NEGATIVE
Influenza B by PCR: NEGATIVE
SARS Coronavirus 2 by RT PCR: NEGATIVE

## 2021-05-25 LAB — LIPID PANEL
Cholesterol: 174 mg/dL (ref 0–200)
HDL: 30 mg/dL — ABNORMAL LOW (ref >40)
LDL Cholesterol: 130 mg/dL — ABNORMAL HIGH (ref 0–99)
Total CHOL/HDL Ratio: 5.8 RATIO
Triglycerides: 72 mg/dL (ref <150)
VLDL: 14 mg/dL (ref 0–40)

## 2021-05-25 LAB — APTT: aPTT: 37 seconds — ABNORMAL HIGH (ref 24–36)

## 2021-05-25 LAB — MAGNESIUM: Magnesium: 1.5 mg/dL — ABNORMAL LOW (ref 1.7–2.4)

## 2021-05-25 MED ORDER — SODIUM CHLORIDE 0.9% FLUSH
3.0000 mL | Freq: Two times a day (BID) | INTRAVENOUS | Status: DC
  Administered 2021-05-25 – 2021-06-03 (×14): 3 mL via INTRAVENOUS

## 2021-05-25 MED ORDER — TAMSULOSIN HCL 0.4 MG PO CAPS
0.4000 mg | ORAL_CAPSULE | Freq: Every day | ORAL | Status: DC
  Administered 2021-05-26 – 2021-06-24 (×30): 0.4 mg via ORAL
  Filled 2021-05-25 (×31): qty 1

## 2021-05-25 MED ORDER — FINASTERIDE 5 MG PO TABS
5.0000 mg | ORAL_TABLET | Freq: Every day | ORAL | Status: DC
  Administered 2021-05-26 – 2021-07-03 (×39): 5 mg via ORAL
  Filled 2021-05-25 (×41): qty 1

## 2021-05-25 MED ORDER — LATANOPROST 0.005 % OP SOLN
1.0000 [drp] | Freq: Every day | OPHTHALMIC | Status: DC
  Administered 2021-05-29 – 2021-07-02 (×30): 1 [drp] via OPHTHALMIC
  Filled 2021-05-25 (×3): qty 2.5

## 2021-05-25 MED ORDER — POLYETHYLENE GLYCOL 3350 17 G PO PACK: 17.0000 g | PACK | Freq: Every day | ORAL | Status: DC | PRN

## 2021-05-25 MED ORDER — INSULIN ASPART 100 UNIT/ML IJ SOLN
0.0000 [IU] | Freq: Three times a day (TID) | INTRAMUSCULAR | Status: DC
  Administered 2021-05-26 – 2021-05-27 (×2): 2 [IU] via SUBCUTANEOUS
  Administered 2021-05-27 – 2021-05-28 (×5): 1 [IU] via SUBCUTANEOUS
  Administered 2021-05-29: 3 [IU] via SUBCUTANEOUS
  Administered 2021-05-30: 2 [IU] via SUBCUTANEOUS
  Administered 2021-05-30: 1 [IU] via SUBCUTANEOUS
  Administered 2021-05-31: 2 [IU] via SUBCUTANEOUS
  Administered 2021-06-01: 3 [IU] via SUBCUTANEOUS
  Administered 2021-06-01: 1 [IU] via SUBCUTANEOUS
  Administered 2021-06-02 (×3): 2 [IU] via SUBCUTANEOUS
  Administered 2021-06-03 – 2021-06-04 (×3): 1 [IU] via SUBCUTANEOUS
  Administered 2021-06-04 (×2): 3 [IU] via SUBCUTANEOUS
  Administered 2021-06-05 (×2): 2 [IU] via SUBCUTANEOUS
  Administered 2021-06-05: 3 [IU] via SUBCUTANEOUS
  Administered 2021-06-06 (×3): 2 [IU] via SUBCUTANEOUS
  Administered 2021-06-07: 3 [IU] via SUBCUTANEOUS
  Administered 2021-06-07: 1 [IU] via SUBCUTANEOUS
  Administered 2021-06-07: 2 [IU] via SUBCUTANEOUS
  Administered 2021-06-08: 1 [IU] via SUBCUTANEOUS
  Administered 2021-06-08: 5 [IU] via SUBCUTANEOUS
  Administered 2021-06-08: 3 [IU] via SUBCUTANEOUS
  Administered 2021-06-09: 2 [IU] via SUBCUTANEOUS
  Administered 2021-06-09: 3 [IU] via SUBCUTANEOUS
  Administered 2021-06-09: 2 [IU] via SUBCUTANEOUS
  Administered 2021-06-10: 1 [IU] via SUBCUTANEOUS
  Administered 2021-06-10: 3 [IU] via SUBCUTANEOUS
  Administered 2021-06-10: 2 [IU] via SUBCUTANEOUS
  Administered 2021-06-11: 3 [IU] via SUBCUTANEOUS
  Administered 2021-06-11 – 2021-06-12 (×3): 1 [IU] via SUBCUTANEOUS
  Administered 2021-06-12 – 2021-06-13 (×3): 2 [IU] via SUBCUTANEOUS
  Administered 2021-06-13 (×2): 1 [IU] via SUBCUTANEOUS
  Administered 2021-06-14 (×2): 2 [IU] via SUBCUTANEOUS
  Administered 2021-06-14: 1 [IU] via SUBCUTANEOUS
  Administered 2021-06-15: 2 [IU] via SUBCUTANEOUS
  Administered 2021-06-15: 3 [IU] via SUBCUTANEOUS
  Administered 2021-06-16: 2 [IU] via SUBCUTANEOUS
  Administered 2021-06-16: 3 [IU] via SUBCUTANEOUS
  Administered 2021-06-16: 1 [IU] via SUBCUTANEOUS
  Administered 2021-06-17: 3 [IU] via SUBCUTANEOUS
  Administered 2021-06-17 – 2021-06-18 (×2): 1 [IU] via SUBCUTANEOUS
  Administered 2021-06-18 – 2021-06-19 (×3): 2 [IU] via SUBCUTANEOUS
  Administered 2021-06-19: 1 [IU] via SUBCUTANEOUS
  Administered 2021-06-22 (×2): 2 [IU] via SUBCUTANEOUS
  Administered 2021-06-23: 1 [IU] via SUBCUTANEOUS
  Administered 2021-06-23: 2 [IU] via SUBCUTANEOUS
  Administered 2021-06-24 (×2): 1 [IU] via SUBCUTANEOUS
  Administered 2021-06-25: 3 [IU] via SUBCUTANEOUS
  Administered 2021-06-25: 1 [IU] via SUBCUTANEOUS
  Administered 2021-06-26 (×2): 2 [IU] via SUBCUTANEOUS
  Administered 2021-06-28 (×2): 1 [IU] via SUBCUTANEOUS
  Administered 2021-06-29: 3 [IU] via SUBCUTANEOUS
  Administered 2021-06-29: 2 [IU] via SUBCUTANEOUS
  Administered 2021-06-29: 1 [IU] via SUBCUTANEOUS
  Administered 2021-06-30: 5 [IU] via SUBCUTANEOUS

## 2021-05-25 MED ORDER — MAGNESIUM SULFATE 2 GM/50ML IV SOLN
2.0000 g | Freq: Once | INTRAVENOUS | Status: AC
  Administered 2021-05-25: 2 g via INTRAVENOUS
  Filled 2021-05-25: qty 50

## 2021-05-25 MED ORDER — SODIUM CHLORIDE 0.9% FLUSH
3.0000 mL | Freq: Once | INTRAVENOUS | Status: AC
  Administered 2021-05-25: 3 mL via INTRAVENOUS

## 2021-05-25 MED ORDER — BRINZOLAMIDE 1 % OP SUSP
1.0000 [drp] | Freq: Three times a day (TID) | OPHTHALMIC | Status: DC
  Administered 2021-05-26 – 2021-07-03 (×108): 1 [drp] via OPHTHALMIC
  Filled 2021-05-25 (×2): qty 10

## 2021-05-25 MED ORDER — POTASSIUM CHLORIDE CRYS ER 20 MEQ PO TBCR
40.0000 meq | EXTENDED_RELEASE_TABLET | Freq: Two times a day (BID) | ORAL | Status: DC
  Administered 2021-05-26: 40 meq via ORAL
  Filled 2021-05-25: qty 2

## 2021-05-25 MED ORDER — APIXABAN 5 MG PO TABS
5.0000 mg | ORAL_TABLET | Freq: Two times a day (BID) | ORAL | Status: DC
  Administered 2021-05-26 – 2021-07-03 (×75): 5 mg via ORAL
  Filled 2021-05-25 (×79): qty 1

## 2021-05-25 MED ORDER — POTASSIUM CHLORIDE CRYS ER 20 MEQ PO TBCR
40.0000 meq | EXTENDED_RELEASE_TABLET | Freq: Once | ORAL | Status: AC
  Administered 2021-05-25: 40 meq via ORAL
  Filled 2021-05-25: qty 2

## 2021-05-25 MED ORDER — POTASSIUM CHLORIDE 10 MEQ/100ML IV SOLN
10.0000 meq | INTRAVENOUS | Status: AC
  Administered 2021-05-25 (×2): 10 meq via INTRAVENOUS
  Filled 2021-05-25 (×2): qty 100

## 2021-05-25 MED ORDER — MAGNESIUM OXIDE -MG SUPPLEMENT 400 (240 MG) MG PO TABS
800.0000 mg | ORAL_TABLET | Freq: Once | ORAL | Status: AC
  Administered 2021-05-25: 800 mg via ORAL
  Filled 2021-05-25: qty 2

## 2021-05-25 MED ORDER — BRIMONIDINE TARTRATE 0.2 % OP SOLN
1.0000 [drp] | Freq: Three times a day (TID) | OPHTHALMIC | Status: DC
  Administered 2021-05-26 – 2021-07-03 (×108): 1 [drp] via OPHTHALMIC
  Filled 2021-05-25 (×3): qty 5

## 2021-05-25 MED ORDER — ACETAMINOPHEN 650 MG RE SUPP: 650.0000 mg | Freq: Four times a day (QID) | RECTAL | Status: DC | PRN

## 2021-05-25 MED ORDER — ACETAMINOPHEN 325 MG PO TABS
650.0000 mg | ORAL_TABLET | Freq: Four times a day (QID) | ORAL | Status: DC | PRN
  Administered 2021-05-31 – 2021-07-03 (×13): 650 mg via ORAL
  Filled 2021-05-25 (×15): qty 2

## 2021-05-25 NOTE — H&P (Signed)
Date: 05/25/2021               Patient Name:  [name] [name] MRN: [id number]  DOB: 1945/09/18 Age / Sex: 75 y.o., male   PCP: Raquel James, MD         Medical Service: Internal Medicine Teaching Service         Attending Physician: Dr. Deno Etienne, DO    First Contact: Dr. Humphrey Rolls Pager: [phone]  Second Contact: Dr. Charleen Kirks Pager: [phone]       After Hours (After 5p/  First Contact Pager: [phone]  weekends / holidays): Second Contact Pager: [phone]   Chief Complaint: Left leg weakness   History of Present Illness:   [name] is a 75 y/o male with a PMHx of A. 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 [name] has dementia with short-term memory deficits at baseline. His wife [name] [name] is at bedside to assist with history taking.   At 9:30 AM today, [name] was noted to have sudden onset left leg weakness after attempting to stand up from the couch. He sat back down and his wife helped him stand up with a walker. [name] noticed her husband was slightly dragging his left leg. After a few minutes of walking with the walker, [name] became weak and slumped over, however he never lost consciousness. When he slumped over, he landed on his knees. No LOC or head trauma.  At this time, [name] [name] denies any acute complaints. His wife notes chronic fatigue that has been ongoing. At recent PCP visit, Amlodipine, Metformin and Lantus were discontinued.    [name] [name] endorses worsening dementia over the last months and years. [name] [name] has been experiencing visual hallucinations although this has seemed improved lately. He has been struggling with both short and long term memory. He is independent in ADLs such as grooming and bathing; he is still able to mow the lawn. However, Mrs. Palazzi manages his medications.   Meds:  Apixaban 5 mg BID Brimonidine 0.2%/Brinzolamid 1% 1 drop TID Finasteride 5  mg daily  Flonase  Latanoprost 0.005% 1 droop at bedtime Entresto 24-26 BID  Sildenafil 100 mg PRN Tamsulosin 0.4 mg daily   Allergies: Allergies as of 05/25/2021   (No Known Allergies)   Past Medical History:  Diagnosis Date   Arthritis    both knees   Diabetes mellitus without complication ([place])    type II   Headache    Hypertension    Family History:  Diabetes: Children HTN: Children   Social History:  PCP: Dr. Cherlynn [name] with last visit on 04/27/2021 for check up. Lives with his wife [name] [name] ADLs: Is able to perform ADLs without assistance Tobacco: Denies Alcohol: Beer when cutting the grass Illicit Substances: Denies   Physical Exam: Blood pressure 119/74, pulse 81, temperature 98.6 F (37 C), temperature source Oral, resp. rate 16, SpO2 96 %. General: NAD, laying comfortable in bed Head: Atraumatic, normocephalic Eyes:  PERL, normal movement Mouth: Moist mucus membrane Neck: Trachea midline, no swelling present Lungs: CTAB, no wheeze, rhonchi, or rales present Cardiovascular: Normal heart sounds, 2+ pulses Abdomen: Normal bowel sounds, No TTP MSK: NO LE edema, strength 5/5 in all extremities, good grip bilaterally.  Skin: Dry, non erythematous Neuro: CNII-CNXII intact. Sensation intact in face, limbs, feet with intact position sense. Psych: Normal mood, normal affect.   CBC Latest Ref Rng &  Units 05/25/2021 05/25/2021 03/01/2020  WBC 4.0 - 10.5 K/uL - 7.9 6.0  Hemoglobin 13.0 - 17.0 g/dL 12.6(L) 12.3(L) 11.5(L)  Hematocrit 39.0 - 52.0 % 37.0(L) 36.3(L) 32.5(L)  Platelets 150 - 400 K/uL - 211 223    CMP Latest Ref Rng & Units 05/25/2021 05/25/2021 03/01/2020  Glucose 70 - 99 mg/dL 187(H) 191(H) 83  BUN 8 - 23 mg/dL 13 13 11   Creatinine 0.61 - 1.24 mg/dL 1.60(H) 1.71(H) 1.16  Sodium 135 - 145 mmol/L 138 135 141  Potassium 3.5 - 5.1 mmol/L 2.7(LL) 2.8(L) 3.0(L)  Chloride 98 - 111 mmol/L 100 100 103  CO2 22 - 32 mmol/L - 26 28  Calcium 8.9 - 10.3 mg/dL - 8.9  8.6(L)  Total Protein 6.5 - 8.1 g/dL - 6.3(L) -  Total Bilirubin 0.3 - 1.2 mg/dL - 1.2 -  Alkaline Phos 38 - 126 U/L - 53 -  AST 15 - 41 U/L - 28 -  ALT 0 - 44 U/L - 45(H) -    Magnesium: 1.5    EKG: personally reviewed my interpretation is a fib.   MR BRAIN WO CONTRAST  Result Date: 05/25/2021 CLINICAL DATA:  Neuro deficit, stroke suspected, left-sided weakness EXAM: MRI HEAD WITHOUT CONTRAST TECHNIQUE: IMPRESSION: 1. Small areas of restricted diffusion in the right corona radiata and posterior limb of the right internal capsule, consistent with acute infarcts. 2. Redemonstrated pituitary mass, better evaluated on 01/27/2021, but overall unchanged compared to the prior exam 3. 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 These results were called by telephone at the time of interpretation on 05/25/2021 at 3:54 pm to [name] , who verbally acknowledged these results. Electronically Signed   By: Merilyn Baba M.D.   On: 05/25/2021 15:55   CT HEAD CODE STROKE WO CONTRAST  Result Date: 05/25/2021 CLINICAL DATA:  Code stroke.  Neuro deficit, acute, stroke suspected EXAM: CT HEAD WITHOUT CONTRAST TECHNIQUE:  IMPRESSION: 1. No evidence of acute large vascular territory infarct or acute hemorrhage. ASPECTS is 10. 2. Chronic lacunar infarcts and chronic microvascular ischemic disease. 3. Probable thin chronic subdural fluid collections, better characterized on prior MRI. 4. Redemonstrated pituitary mass with suprasellar extension, better characterized on prior MRI. Code stroke imaging results were communicated on 05/25/2021 at 12:24 pm to provider Dr. Theda Sers via secure text paging. Electronically Signed   By: Margaretha [place] M.D.   On: 05/25/2021 12:28     Assessment & Plan by Problem: [name] [name] is a 75 y/o male with a PMHx of A. 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   CVA: Patient presented with left sided weakness occurring in left leg and some in the arm as well. No other symptoms endorsed.  -- Neurology consult -- Allow for permissive HTN systolic < 301 -- Statin  -- Echocardiogram with bubble study  -- Carotid doppler or CTA head & neck  -- A1C  -- Lipid panel  -- If elevated then start statin  -- Tele monitoring  -- SLP eval -- PT/OT   Hypokalemia Had potassium of 2.8 on initial testing. Repleted potassium 10 mEq x3. 40 mEq oral today as well. Goal is K of 4.  -Replete as needed for K of 4.  Afib -Eliquis 5 mg BID  HFrEF -EF 25%. Home meds include Entresto 24-26 mg. Held due to BP lowering effect. -Repeat Echo pending as stated  above.   HTN -Chronic; hold home meds  DMII -Chronic; SSI  Dementia Patient's wife states, patient's PCP recently told them patient has dementia. He sent a prescription to the pharmacy but usually that takes a while for them to get it.  -Delirium precautions.   DVT prophx:Eliquis Diet:Heart Healthy Bowel:PRN Code:Full  Prior to Admission Living Arrangement:Home Anticipated Discharge Location: Home Barriers to Discharge: Medical work up  Dispo: Admit patient to Observation with expected length of stay less than 2 midnights.  [name], MD Tillie Rung. [facility] Res[facility], PGY-1  Pager: [phone]

## 2021-05-25 NOTE — Code Documentation (Signed)
Stroke Response Nurse Documentation Code Documentation  [name] [name] is a 75 y.o. male arriving to [facility] ED via [facility] on 05/25/21 with past medical hx of DM, HTN. On Eliquis (apixaban) daily, last dose this AM. Code stroke was activated by EMS.   Patient from home where he was LKW at [place] when his wife noticed he was bumping into things and having left sided deficits. Patient complaining of headache--seeing neurologist over the past year for headaches.   Stroke team at the bedside on patient arrival. Labs drawn and patient cleared for CT by Dr. Tyrone Nine. Patient to CT with team. NIHSS 3, see documentation for details and code stroke times. Patient with disoriented, left arm weakness, and left leg weakness on exam. The following imaging was completed:  CT. Patient is not a candidate for IV Thrombolytic due to currently taking Eliquis.  Care/Plan: q2h vitals and NIHSS. SBP <180.   Bedside handoff with ED RN Alexa.    Candace Cruise K  Stroke Response RN

## 2021-05-25 NOTE — ED Notes (Signed)
Called Main lab to add on magnesium.

## 2021-05-25 NOTE — Consult Note (Signed)
Neurology Consult H&P  [employer] MR# [id number] 05/25/2021   CC: left sided weakness  History is obtained from: patient, wife and chart  HPI: [name] [name] is a 75 y.o. male right handed PMHx as reviewed below, s/p right hip replacement, A. Fib on apixaban developed left sided weakness at 0930. His wife noticed that he was bumping into things and dragging his left foot. He has not missed any doses of apixaban and he has never had this before.  His wife says he gets fatigue more than usual as of late.  He denies recent illness, f/c/n/v, sob Patient complaining of headache--seeing neurologist over the past year for headaches.    LKW: 0930 tNK given: No outside window IR Thrombectomy No, low NIHSS Modified Rankin Scale: 0-Completely asymptomatic and back to baseline post- stroke NIHSS: 3  ROS: A complete ROS was performed and is negative except as noted in the HPI.   Past Medical History:  Diagnosis Date   Arthritis    both knees   Diabetes mellitus without complication ([place])    type II   Headache    Hypertension      History reviewed. No pertinent family history.  Social History:  reports that he has never smoked. He has never used smokeless tobacco. He reports current alcohol use. He reports that he does not use drugs.   Prior to Admission medications   Medication Sig Start Date End Date Taking? Authorizing Provider  acetaminophen (TYLENOL) 500 MG tablet Take 1,000 mg by mouth every 6 (six) hours as needed for mild pain.   Yes [provider]  apixaban (ELIQUIS) 5 MG TABS tablet Take 5 mg by mouth daily. 01/21/21  Yes [provider]  Brinzolamide-Brimonidine 1-0.2 % SUSP Place 1 drop into both eyes in the morning, at noon, and at bedtime. 12/09/20  Yes [provider]  Cholecalciferol 50 MCG (2000 UT) TABS Take 50 mcg by mouth daily. 04/27/21  Yes [provider]  finasteride (PROSCAR) 5 MG tablet Take 5 mg by mouth daily. 11/10/20   Yes [provider]  latanoprost (XALATAN) 0.005 % ophthalmic solution Place 1 drop into both eyes at bedtime. 08/24/18  Yes [provider]  metoprolol succinate (TOPROL-XL) 100 MG 24 hr tablet Take 50 mg by mouth daily. 05/14/21  Yes [provider]  sacubitril-valsartan (ENTRESTO) 24-26 MG Take 1 tablet by mouth 2 (two) times daily. 01/09/21  Yes [provider]  sildenafil (VIAGRA) 100 MG tablet Take 100 mg by mouth daily as needed for erectile dysfunction.   Yes [provider]  spironolactone (ALDACTONE) 25 MG tablet Take 12.5 mg by mouth daily. 05/14/21  Yes [provider]  tamsulosin (FLOMAX) 0.4 MG CAPS capsule Take 0.4 mg by mouth daily.   Yes [provider]  vitamin B-12 (CYANOCOBALAMIN) 500 MCG tablet Take 1,000 mcg by mouth daily. 04/27/21  Yes [provider]    Exam: Current vital signs: BP 124/88   Pulse (!) 58   Temp 98.6 F (37 C) (Oral)   Resp 12   SpO2 97%   Physical Exam  Constitutional: Appears well-developed and well-nourished.  Psych: Affect appropriate to situation Eyes: No scleral injection HENT: No OP obstruction. Head: Normocephalic.  Cardiovascular: Normal rate and regular rhythm.  Respiratory: Effort normal, symmetric excursions bilaterally, no audible wheezing. GI: Soft.  No distension. There is no tenderness.  Skin: WDI  Neuro: Mental Status: Patient is awake, alert, oriented to person, place, month, year, and situation. Patient  is able to give a clear and coherent history. Speech fluent, intact comprehension and repetition. No signs of aphasia or neglect. Visual Fields are full. Pupils are equal, round, and reactive to light. EOMI without ptosis or diploplia.  Facial sensation is symmetric to temperature Facial movement is symmetric.  Hearing is intact to voice. Uvula midline and palate elevates symmetrically. Shoulder shrug is symmetric. Tongue is midline without atrophy or  fasciculations.  Tone is normal. Bulk is normal. Mild weakness in left lower extremity ~4+/5. Sensation is symmetric to light touch and temperature in the arms and legs. Deep Tendon Reflexes: 2+ and symmetric in the biceps and patellae. Toes are downgoing bilaterally. FNF and HKS are intact bilaterally. Gait - Deferred  I have reviewed labs in epic and the pertinent results are: No pertinent labs available.  I have reviewed the images obtained: NCT head showed did not show acute ischemic changes, ASPECTS 10  Assessment: [name] is a 75 y.o. male PMHx HTN, DM2, atrial fibrillation (apixaban) with transient left sided weakness which has improved since arrival suspicious for TIA. He was not candidate for tNK. He does have vascular risk factors and will need further TIA evaluation.  Impression:  TIA - Transient left sided weakness Atrial fibrillation On apixaban NIHSS 3 HTN DM 2   Plan: - MRI brain without contrast. - Recommend vascular imaging with MRA head and neck. - Recommend TTE. - Recommend labs: HbA1c, lipid panel. - Recommend Statin if LDL > 70 - Continue apixaban. - SBP goal <160.  - Telemetry monitoring for arrhythmia. - Recommend bedside Swallow screen. - Recommend Stroke education. - Recommend PT/OT/SLP consult.  This patient is critically ill and at significant risk of neurological worsening, death and care requires constant monitoring of vital signs, hemodynamics,respiratory and cardiac monitoring, neurological assessment, discussion with family, other specialists and medical decision making of high complexity. I spent 73 minutes of neurocritical care time  in the care of  this patient. This was time spent independent of any time provided by nurse practitioner or PA.  Electronically signed by:  Lynnae Sandhoff, MD Page: [phone] 05/25/2021, 2:35 PM

## 2021-05-25 NOTE — ED Provider Notes (Signed)
[facility] EMERGENCY DEPARTMENT Provider Note   CSN: [id number] Arrival date & time: 05/25/21  1202  An emergency department physician performed an initial assessment on this suspected stroke patient at 1205.  History Chief Complaint  Patient presents with   Code Stroke    [name] [name] is a 75 y.o. male.  75 yo M arrived as a code stroke.  Taken urgently back to CT.  Level 5 caveat acuity of condition.       Past Medical History:  Diagnosis Date   Arthritis    both knees   Diabetes mellitus without complication ([place])    type II   Headache    Hypertension     Patient Active Problem List   Diagnosis Date Noted   Osteoarthritis of right knee 08/04/2017    Past Surgical History:  Procedure Laterality Date   COLONOSCOPY     DG BARIUM ENEMA ([place] [facility])     HIP ARTHROPLASTY Right    KNEE ARTHROPLASTY Right 08/04/2017   Procedure: RIGHT TOTAL KNEE ARTHROPLASTY WITH COMPUTER NAVIGATION;  Surgeon: Rod Can, MD;  Location: [facility];  Service: Orthopedics;  Laterality: Right;  Adductor Block   KNEE ARTHROSCOPY Right 2004       History reviewed. No pertinent family history.  Social History   Tobacco Use   Smoking status: Never   Smokeless tobacco: Never  Vaping Use   Vaping Use: Never used  Substance Use Topics   Alcohol use: Yes    Comment: 5th will last a month    Drug use: No    Home Medications Prior to Admission medications   Medication Sig Start Date End Date Taking? Authorizing Provider  amLODipine (NORVASC) 10 MG tablet Take 10 mg by mouth daily.    [provider]  aspirin 81 MG chewable tablet Chew 1 tablet (81 mg total) by mouth 2 (two) times daily. 08/05/17   Swinteck, Aaron Edelman, MD  docusate sodium (COLACE) 50 MG capsule Take 50 mg by mouth 2 (two) times daily as needed for mild constipation.     [provider]  glucose blood test strip 1 each by Other route as needed for other. Use as instructed    [provider]  insulin regular (NOVOLIN R,HUMULIN R) 100 units/mL injection Inject 72 Units into the skin at bedtime.     [provider]  latanoprost (XALATAN) 0.005 % ophthalmic solution Place 1 drop into both eyes at bedtime. 08/24/18   [provider]  lisinopril (PRINIVIL,ZESTRIL) 40 MG tablet Take 40 mg by mouth daily.    [provider]  metFORMIN (GLUCOPHAGE) 1000 MG tablet Take 1,000 mg by mouth daily with breakfast.     [provider]  senna (SENOKOT) 8.6 MG TABS tablet Take 2 tablets (17.2 mg total) by mouth at bedtime. Patient taking differently: Take 2 tablets by mouth at bedtime as needed for mild constipation.  08/05/17   Swinteck, Aaron Edelman, MD  sildenafil (VIAGRA) 100 MG tablet Take 100 mg by mouth daily as needed for erectile dysfunction.    [provider]    Allergies    Patient has no known allergies.  Review of Systems   Review of Systems  Unable to perform ROS: Acuity of condition   Physical Exam Updated Vital Signs BP 119/74 (BP Location: Right Arm)   Pulse 81   Temp 98.6 F (37 C) (Oral)   Resp 16   SpO2 96%   Physical Exam Vitals and nursing note  reviewed.  Constitutional:      Appearance: He is well-developed.  HENT:     Head: Normocephalic and atraumatic.  Eyes:     Pupils: Pupils are equal, round, and reactive to light.  Neck:     Vascular: No JVD.  Cardiovascular:     Rate and Rhythm: Normal rate and regular rhythm.     Heart sounds: No murmur heard.   No friction rub. No gallop.  Pulmonary:     Effort: No respiratory distress.     Breath sounds: No wheezing.  Abdominal:     General: There is no distension.     Tenderness: There is no abdominal tenderness. There is no guarding or rebound.  Musculoskeletal:        General: Normal range of motion.     Cervical back: Normal range of motion and neck supple.  Skin:    Coloration: Skin is not pale.     Findings: No rash.  Neurological:     Mental  Status: He is alert and oriented to person, place, and time.  Psychiatric:        Behavior: Behavior normal.    ED Results / Procedures / Treatments   Labs (all labs ordered are listed, but only abnormal results are displayed) Labs Reviewed  PROTIME-INR - Abnormal; Notable for the following components:      Result Value   Prothrombin Time 16.5 (*)    INR 1.3 (*)    All other components within normal limits  APTT - Abnormal; Notable for the following components:   aPTT 37 (*)    All other components within normal limits  CBC - Abnormal; Notable for the following components:   RBC 3.62 (*)    Hemoglobin 12.3 (*)    HCT 36.3 (*)    MCV 100.3 (*)    All other components within normal limits  COMPREHENSIVE METABOLIC PANEL - Abnormal; Notable for the following components:   Potassium 2.8 (*)    Glucose, Bld 191 (*)    Creatinine, Ser 1.71 (*)    Total Protein 6.3 (*)    ALT 45 (*)    GFR, Estimated 41 (*)    All other components within normal limits  MAGNESIUM - Abnormal; Notable for the following components:   Magnesium 1.5 (*)    All other components within normal limits  I-STAT CHEM 8, ED - Abnormal; Notable for the following components:   Potassium 2.7 (*)    Creatinine, Ser 1.60 (*)    Glucose, Bld 187 (*)    Calcium, Ion 1.02 (*)    Hemoglobin 12.6 (*)    HCT 37.0 (*)    All other components within normal limits  CBG MONITORING, ED - Abnormal; Notable for the following components:   Glucose-Capillary 171 (*)    All other components within normal limits  RESP PANEL BY RT-PCR (FLU A&B, COVID) ARPGX2  DIFFERENTIAL    EKG EKG Interpretation  Date/Time:  Monday May 25 2021 12:24:11 EDT Ventricular Rate:  107 PR Interval:    QRS Duration: 91 QT Interval:  339 QTC Calculation: 453 R Axis:   16 Text Interpretation: Atrial fibrillation Anteroseptal infarct, old Borderline repol abnormality, diffuse leads Otherwise no significant change Confirmed by Deno Etienne  [phone]) on 05/25/2021 12:58:37 PM  Radiology CT HEAD CODE STROKE WO CONTRAST  Result Date: 05/25/2021 CLINICAL DATA:  Code stroke.  Neuro deficit, acute, stroke suspected EXAM: CT HEAD WITHOUT CONTRAST TECHNIQUE: Contiguous axial images were obtained from the  base of the skull through the vertex without intravenous contrast. COMPARISON:  MRI January 27, 2021. CT head February 29, 2020. FINDINGS: Brain: No evidence of acute large vascular territory infarction, hemorrhage, hydrocephalus, extra-axial collection or intraparenchymal brain mass lesion/mass effect. Redemonstrated pituitary mass with suprasellar extension, better characterized on prior MRI. Chronic lacunar infarcts in bilateral basal ganglia and corona radiata. Probable thin chronic subdural fluid collections, better characterized on prior MRI. Vascular: No hyperdense vessel identified. Skull: No acute fracture. Sinuses/Orbits: Visible sinuses are clear. No acute findings in the visualized orbits. Other: No mastoid effusions. ASPECTS [facility] Stroke Program Early CT Score) total score (0-10 with 10 being normal): 10. IMPRESSION: 1. No evidence of acute large vascular territory infarct or acute hemorrhage. ASPECTS is 10. 2. Chronic lacunar infarcts and chronic microvascular ischemic disease. 3. Probable thin chronic subdural fluid collections, better characterized on prior MRI. 4. Redemonstrated pituitary mass with suprasellar extension, better characterized on prior MRI. Code stroke imaging results were communicated on 05/25/2021 at 12:24 pm to provider Dr. Theda Sers via secure text paging. Electronically Signed   By: Margaretha Sheffield M.D.   On: 05/25/2021 12:28    Procedures Procedures   Medications Ordered in ED Medications  sodium chloride flush (NS) 0.9 % injection 3 mL (has no administration in time range)  potassium chloride 10 mEq in 100 mL IVPB (10 mEq Intravenous New Bag/Given 05/25/21 1251)  magnesium oxide (MAG-OX) tablet 800 mg (has no  administration in time range)  magnesium sulfate IVPB 2 g 50 mL (has no administration in time range)    ED Course  I have reviewed the triage vital signs and the nursing notes.  Pertinent labs & imaging results that were available during my care of the patient were reviewed by me and considered in my medical decision making (see chart for details).    MDM Rules/Calculators/A&P                           75 yo M with a chief complaints of acute strokelike symptoms taken urgently back to CT scan.  Found to have initial potassium 2.7.  EKG likely with prolonged QT.  Will give IV potassium.  Mag is also low will replenish.    Discussed the case with Dr. Theda Sers, neurology recommends medical admission for stroke work-up.  CRITICAL CARE Performed by: Cecilio Asper   Total critical care time: 35 minutes  Critical care time was exclusive of separately billable procedures and treating other patients.  Critical care was necessary to treat or prevent imminent or life-threatening deterioration.  Critical care was time spent personally by me on the following activities: development of treatment plan with patient and/or surrogate as well as nursing, discussions with consultants, evaluation of patient's response to treatment, examination of patient, obtaining history from patient or surrogate, ordering and performing treatments and interventions, ordering and review of laboratory studies, ordering and review of radiographic studies, pulse oximetry and re-evaluation of patient's condition.   The patients results and plan were reviewed and discussed.   Any x-rays performed were independently reviewed by myself.   Differential diagnosis were considered with the presenting HPI.  Medications  sodium chloride flush (NS) 0.9 % injection 3 mL (has no administration in time range)  potassium chloride 10 mEq in 100 mL IVPB (10 mEq Intravenous New Bag/Given 05/25/21 1251)  magnesium oxide  (MAG-OX) tablet 800 mg (has no administration in time range)  magnesium sulfate IVPB 2 g 50  mL (has no administration in time range)    Vitals:   05/25/21 1205 05/25/21 1225 05/25/21 1245 05/25/21 1257  BP: (!) 128/104  (!) 144/76 119/74  Pulse:   84 81  Resp:   (!) 21 16  Temp:   98.6 F (37 C)   TempSrc:   Oral   SpO2: 97% 100% 99% 96%    Final diagnoses:  Stroke, acute, embolic (HCC)  Hypokalemia  Hypomagnesemia    Admission/ observation were discussed with the admitting physician, patient and/or family and they are comfortable with the plan.   Final Clinical Impression(s) / ED Diagnoses Final diagnoses:  Stroke, acute, embolic ([place])  Hypokalemia  Hypomagnesemia    Rx / DC Orders ED Discharge Orders     None        Deno Etienne, DO 05/25/21 1324

## 2021-05-25 NOTE — ED Notes (Addendum)
Pt unsteady on feet, appearing weak on left and right leg (right leg from previous surgery, left leg weakness new on admission). One person assist used to help pt to wheel chair for bathroom. Pt pt alert, able to hold conversation, but difficulty noted in keeping conversation to current situation, falls quickly in to long tangents about the past. When asked if this confusion is new, pt's wife states yes.

## 2021-05-25 NOTE — ED Triage Notes (Signed)
Pt bib [facility] for Code Stroke. Last known well was at 0930 this am. Pt experienced sudden Lt side weakness, and slowly lowered himself to a chair. Denies LOC. Pt is on eliquis. Pt c/o headache and lightheadedness today as well prior to the weakness.   EMS vitals 247 CBG 88 HR 140/90 BP 100 RA

## 2021-05-25 NOTE — ED Notes (Signed)
Patient transported to MRI 

## 2021-05-25 NOTE — Progress Notes (Signed)
Paged by RN and ED provider for patient's agitation and wanting to leave AMA.  When evaluated bedside, he was sitting on the bed and appears comfortable with no acute distress.  His wife is also at bedside.  Patient reports that he did not like the way he was treated here and would like to leave.  I explained to him the risk and benefit of remain inpatient for stroke work-up.  Patient first verbalized understanding and quickly forgot that he had a stroke.  His wife states that he has had a memory issue.  I was able to persuade patient to stay overnight for more work-up.  We will minimize interruptions at night.  We will attempt redirection and reorientation if he becomes confused.  If not successful, will trial chemical restraint.

## 2021-05-25 NOTE — ED Notes (Addendum)
Pt became increasingly agitated and attempted to leave, seems to be very confused. Pt stated that he "Doesn't take orders from women". Security paged and at bedside, inpatient MD paged, EDP notified.

## 2021-05-25 NOTE — ED Notes (Signed)
Admitting MD at bedside.

## 2021-05-26 ENCOUNTER — Observation Stay (HOSPITAL_COMMUNITY): Payer: No Typology Code available for payment source

## 2021-05-26 DIAGNOSIS — G459 Transient cerebral ischemic attack, unspecified: Secondary | ICD-10-CM | POA: Diagnosis not present

## 2021-05-26 DIAGNOSIS — I6389 Other cerebral infarction: Secondary | ICD-10-CM

## 2021-05-26 DIAGNOSIS — I639 Cerebral infarction, unspecified: Secondary | ICD-10-CM | POA: Diagnosis not present

## 2021-05-26 LAB — BASIC METABOLIC PANEL
Anion gap: 10 (ref 5–15)
BUN: 9 mg/dL (ref 8–23)
CO2: 25 mmol/L (ref 22–32)
Calcium: 9.3 mg/dL (ref 8.9–10.3)
Chloride: 102 mmol/L (ref 98–111)
Creatinine, Ser: 1.46 mg/dL — ABNORMAL HIGH (ref 0.61–1.24)
GFR, Estimated: 50 mL/min — ABNORMAL LOW (ref >60)
Glucose, Bld: 144 mg/dL — ABNORMAL HIGH (ref 70–99)
Potassium: 3 mmol/L — ABNORMAL LOW (ref 3.5–5.1)
Sodium: 137 mmol/L (ref 135–145)

## 2021-05-26 LAB — GLUCOSE, CAPILLARY
Glucose-Capillary: 115 mg/dL — ABNORMAL HIGH (ref 70–99)
Glucose-Capillary: 99 mg/dL (ref 70–99)
Glucose-Capillary: 159 mg/dL — ABNORMAL HIGH (ref 70–99)
Glucose-Capillary: 129 mg/dL — ABNORMAL HIGH (ref 70–99)

## 2021-05-26 LAB — ECHOCARDIOGRAM COMPLETE BUBBLE STUDY
Area-P 1/2: 4.67 cm2
MV M vel: 4.87 m/s
MV Peak grad: 94.9 mmHg
Radius: 0.5 cm
S' Lateral: 5.4 cm

## 2021-05-26 LAB — MAGNESIUM: Magnesium: 1.9 mg/dL (ref 1.7–2.4)

## 2021-05-26 MED ORDER — POTASSIUM CHLORIDE 20 MEQ PO PACK
60.0000 meq | PACK | Freq: Once | ORAL | Status: AC
  Administered 2021-05-26: 60 meq via ORAL
  Filled 2021-05-26: qty 3

## 2021-05-26 MED ORDER — LORAZEPAM 2 MG/ML IJ SOLN
1.0000 mg | Freq: Once | INTRAMUSCULAR | Status: AC
  Administered 2021-05-26: 1 mg via INTRAVENOUS
  Filled 2021-05-26: qty 1

## 2021-05-26 MED ORDER — SODIUM CHLORIDE 0.9% FLUSH
10.0000 mL | Freq: Once | INTRAVENOUS | Status: AC
  Administered 2021-05-26: 10 mL via INTRAVENOUS

## 2021-05-26 MED ORDER — LORAZEPAM 2 MG/ML IJ SOLN
1.0000 mg | INTRAMUSCULAR | Status: DC | PRN
  Administered 2021-05-26 (×2): 1 mg via INTRAVENOUS
  Filled 2021-05-26 (×2): qty 1

## 2021-05-26 MED ORDER — ATORVASTATIN CALCIUM 80 MG PO TABS
80.0000 mg | ORAL_TABLET | Freq: Every day | ORAL | Status: DC
  Administered 2021-05-26 – 2021-07-03 (×39): 80 mg via ORAL
  Filled 2021-05-26 (×23): qty 1
  Filled 2021-05-26: qty 2
  Filled 2021-05-26 (×17): qty 1

## 2021-05-26 NOTE — Progress Notes (Signed)
Patient's response to an additional 1mg  of ativan - he is now sleeping.   0500 - patient began to wake up again and attempted to get up. A bath was given at 0550. Patient now sleeping

## 2021-05-26 NOTE — Progress Notes (Signed)
Paged by RN for agitation and inability to redirect.  When evaluated bedside, patient was able to put back in bed and appears comfortable.  States that he was tired but denies any pain or discomfort..    Will turn off the lights and minimize interruptions tonight.  In case of unsuccessful or reorientation, will trial chemical restraint with Ativan.  Avoid Haldol and antipsychotics due to possible Lewy body dementia.  We will use low-dose of Ativan to avoid oversedation.

## 2021-05-26 NOTE — Progress Notes (Deleted)
Patient has received ativan for sedation, and is uncooperative with with exam today. Will attempt to see patient at a later time.

## 2021-05-26 NOTE — Evaluation (Signed)
Physical Therapy Evaluation Patient Details Name: [name] [name] MRN: [id number] DOB: [birth year] Today's Date: 05/26/2021  History of Present Illness  [name] [name] is a 75 y/o male with a PMHx of A. 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 [name] [name] has dementia with short-term memory deficits at baseline.  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  Clinical Impression  Patient unable to participate much with mobility this session.  He was awake, but unable to follow commands and participate in EOB activities likely due to medication.  Evidently he functions well at home as chart reports pt mowing the lawn PTA.  Feel further assessment needed to determine most appropriate post-acute rehab venue.  PT to continue to follow acutely.        Recommendations for follow up therapy are one component of a multi-disciplinary discharge planning process, led by the attending physician.  Recommendations may be updated based on patient status, additional functional criteria and insurance authorization.  Follow Up Recommendations SNF;CIR (given Ativan x 3 from overnight till now, will need further assessment)    Equipment Recommendations  None recommended by PT    Recommendations for Other Services       Precautions / Restrictions Precautions Precautions: Fall Precaution Comments: some agitation needing Ativan overnight      Mobility  Bed Mobility Overal bed mobility: Needs Assistance Bed Mobility: Rolling;Supine to Sit;Sit to Supine Rolling: Max assist   Supine to sit: HOB elevated;Max assist Sit to supine: Total assist   General bed mobility comments: attempted to engage pt in coming up to EOB with assist, pulled some with L UE, but did not maintain sitting needing max A for sitting balance, returned to supine as pt too medicated to participate, sitter in the room  assisted with rolling to place pad under pt and scoot to [facility].    Transfers                 General transfer comment: not safe to attempt  Ambulation/Gait                Stairs            Wheelchair Mobility    Modified Rankin (Stroke Patients Only)       Balance Overall balance assessment: Needs assistance   Sitting balance-Leahy Scale: Zero Sitting balance - Comments: not attempting to support himself in sitting                                     Pertinent Vitals/Pain Pain Assessment: No/denies pain    Home Living Family/patient expects to be discharged to:: Private residence Living Arrangements: Spouse/significant other Available Help at Discharge: Family;Available 24 hours/day Type of Home: House           Additional Comments: patient confused and medicated unable to give history, notes above from prior stay; chart from this admission indicates pt still mowing the grass at home    Prior Function                 Hand Dominance        Extremity/Trunk Assessment   Upper Extremity Assessment Upper Extremity Assessment: Difficult to assess due to impaired cognition    Lower Extremity Assessment Lower Extremity Assessment: Difficult to assess due to impaired cognition;LLE deficits/detail LLE Deficits /  Details: not actively moving L in the bed       Communication   Communication: HOH  Cognition Arousal/Alertness: Lethargic;Suspect due to medications Behavior During Therapy: Restless Overall Cognitive Status: No family/caregiver present to determine baseline cognitive functioning                                 General Comments: not anwering questions or following commands well      General Comments      Exercises     Assessment/Plan    PT Assessment Patient needs continued PT services  PT Problem List Decreased strength;Decreased mobility;Decreased safety awareness;Decreased activity  tolerance;Decreased balance;Decreased knowledge of use of DME;Decreased cognition       PT Treatment Interventions DME instruction;Therapeutic activities;Cognitive remediation;Patient/family education;Therapeutic exercise;Gait training;Balance training;Functional mobility training    PT Goals (Current goals can be found in the Care Plan section)  Acute Rehab PT Goals Patient Stated Goal: unable PT Goal Formulation: Patient unable to participate in goal setting Time For Goal Achievement: 06/09/21 Potential to Achieve Goals: Fair    Frequency Min 4X/week   Barriers to discharge        Co-evaluation               AM-PAC PT "6 Clicks" Mobility  Outcome Measure Help needed turning from your back to your side while in a flat bed without using bedrails?: Total Help needed moving from lying on your back to sitting on the side of a flat bed without using bedrails?: Total Help needed moving to and from a bed to a chair (including a wheelchair)?: Total Help needed standing up from a chair using your arms (e.g., wheelchair or bedside chair)?: Total Help needed to walk in hospital room?: Total Help needed climbing 3-5 steps with a railing? : Total 6 Click Score: 6    End of Session   Activity Tolerance: Patient limited by lethargy Patient left: in bed;with call bell/phone within reach;with bed alarm set;with nursing/sitter in room   PT Visit Diagnosis: Other abnormalities of gait and mobility (R26.89);Hemiplegia and hemiparesis;Muscle weakness (generalized) (M62.81) Hemiplegia - Right/Left: Left Hemiplegia - dominant/non-dominant: Non-dominant Hemiplegia - caused by: Cerebral infarction    Time: 1150-1202 PT Time Calculation (min) (ACUTE ONLY): 12 min   Charges:   PT Evaluation $PT Eval Moderate Complexity: 1 Mod          Cyndi Zakariah Urwin, PT Acute Rehabilitation Services LDJTT:[phone] Office:[phone] 05/26/2021   Reginia Naas 05/26/2021, 12:25 PM

## 2021-05-26 NOTE — Progress Notes (Addendum)
Patient still wanting to go home at this time and has put on shoes since getting out of bed when Nurse Tech and myself (RN) went in his room again. Patient repeatedly speaks about visiting family members and he is not redirectable to the time of night.   Messaged MD for advice. Advised to give an additional dose of 1mg  PRN of Ativan, making the total 2mg  of ativan for the night. I also was advised not to disturb patient during the night, including doing vitals at 4AM and afterwards, if that doesn't work to page once more for further advice/orders.

## 2021-05-26 NOTE — Progress Notes (Signed)
  Echocardiogram 2D Echocardiogram has been performed.  [name] [name] M 05/26/2021, 9:14 AM

## 2021-05-26 NOTE — Progress Notes (Signed)
Paged by RN patient pleasant but becoming increasingly confused and trying to leave "I'm going to my place". Patient seen and evaluated at bedside. Patient becoming increasingly confused and redirection unsuccessful.   Patient alert, but not oriented to place or time.  Pleasantly confused.  Will give 1mg  Ativan.

## 2021-05-26 NOTE — Progress Notes (Signed)
Patient arrived to floor and seemingly pleasant. Patient once again becoming increasingly confused and keeps trying to get up to go home saying "I need to check the mail and I need to go to work."   I have tried to redirect the patient for 30 minutes but he is easily forgetful. Patient keeps saying he needs to put his pants on and is now becoming more irritable the more I attempt to reorient him.   Will message MD for possible PRN

## 2021-05-26 NOTE — Plan of Care (Signed)

## 2021-05-26 NOTE — Progress Notes (Signed)
OT Cancellation Note  Patient Details Name: [name] [name] MRN: [id number] DOB: 1946/03/03   Cancelled Treatment:    Reason Eval/Treat Not Completed: Fatigue/lethargy limiting ability to participate (Pt recieved ativan prior to session, sleeping upon arrival and unable to rouse. OT evaluation to follow as appropriate.)  Nichole Neyer A Joshia Kitchings 05/26/2021, 9:49 AM

## 2021-05-26 NOTE — Progress Notes (Signed)
[name] [name] is a 75 y/o male with a PMHx of A. 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 Subjective:   Overnight: Patient was agitated and confused. Redirection was unsuccessful. He was given 1 mg Ativan. This AM, he received an additional dose of 1 mg Ativan.   On evaluation, patient was very lethargic and unable to be awakened. He is snoring with intermittent apneic events.  Wife, [name] [name], updated via telephone.   Objective: Vital signs in last 24 hours: Vitals:   05/26/21 0021 05/26/21 0745 05/26/21 1151 05/26/21 1552  BP: (!) 140/98 (!) 140/98 (!) 131/95 125/90  Pulse: 80 60 (!) 54 69  Resp: 18 20 16 18   Temp: 98.3 F (36.8 C) 98.6 F (37 C) 98.4 F (36.9 C) 98.5 F (36.9 C)  TempSrc: Oral Axillary Axillary Axillary  SpO2: 100% 100% 97% 93%   Physical Exam Physical Exam Vitals and nursing note reviewed.  HENT:     Head: Normocephalic and atraumatic.  Cardiovascular:     Rate and Rhythm: Normal rate. Rhythm irregularly irregular.  Pulmonary:     Effort: Pulmonary effort is normal. No respiratory distress.  Abdominal:     Palpations: Abdomen is soft.  Musculoskeletal:     Right lower leg: No edema.     Left lower leg: No edema.  Skin:    General: Skin is warm and dry.  Neurological:     Mental Status: He is lethargic.     Comments: No cogwheel rigidity.  Muscle tone normal.   Assessment/Plan: [name] [name] is a 75 y/o male with a PMHx of A. 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  Echo negative for thrombus.  Given location and size, likely secondary to small vessel disease. PT/OT recommending SNF; patient's  wife is in agreement.   - Neurology following; appreciate their recommendations - Permissive hypertension up to 220/120.  Plan to begin antihypertensives tomorrow with slow normalization of blood pressure over the next several days - Start high intensity statin, atorvastatin 80 mg daily - Continue working on risk management (HTN, T2DM) - SNF placement on discharge  # Dementia  Given patient's development of visual hallucinations over the last 6 months in addition to fluctuating cognition with impaired short and long-term memory, high suspicion for Lewy body dementia.  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  LV was mildly dilated.  Patient did have some evidence of mitral regurgitation, however only moderate.  Recent left heart cath done at the [facility] hospital demonstrated nonobstructive CAD.  Etiology of cardiomyopathy undetermined at this time.  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  Current guideline directed medical therapy includes metoprolol, Entresto, and spironolactone.  Holding at this time to allow for permissive hypertension.  - Monitor volume status - Consider restarting GDMT tomorrow  # A. Fib  - No rate or rhythm control needed at this time - Continue Eliquis  # Type 2 Diabetes Mellitus  Well-controlled with A1c of 5.8.  - No SSI indicated  # Hypokalemia  Etiology undetermined  at this time, especially given patient is only on potassium sparing diuretic and Entresto.  - Monitor daily and replete as needed   DVT prophx: Eliquis Diet: Heart Health  Bowel: N/A Code: Full   Prior to Admission Living Arrangement: Home  Anticipated Discharge Location: SNF Barriers to Discharge: Continued medical evaluation and placement   Dispo: Anticipated discharge in approximately 1-2 day(s).   Dr. Jose Persia Internal Medicine PGY-3  Pager:  [phone] After 5pm on weekdays and 1pm on weekends: On Call pager [phone]  05/26/2021, 5:55 PM

## 2021-05-26 NOTE — Progress Notes (Signed)
Occupational Therapy Evaluation Patient Details Name: [name] [name] MRN: [id number] DOB: Jun 08, 1946 Today's Date: 05/26/2021   History of Present Illness [name] [name] is a 75 y/o male with a PMHx of A. 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 [name] [name] has dementia with short-term memory deficits at baseline.  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   Clinical Impression   [name] [name] was mod I prior to the above admission with baseline cognitive deficits. Pt lives in a 1 level home, 3 STE with his wife who is there 24/7. Upon evaluation pt was pleasantly confused and follow most functional commands with increased time. Pt was min  A for bed mobility and min A +2 for sit<>stand and 2 small steps. Pt has LLE and LUE extremity weakness, but difficult to assess due to cognition. Right now he required up to max A +2 for ADLs due to confusion, impulsivity and L extremity weakness. Pt would benefit from continued OT acutely. Recommend SNF at d/c.      Recommendations for follow up therapy are one component of a multi-disciplinary discharge planning process, led by the attending physician.  Recommendations may be updated based on patient status, additional functional criteria and insurance authorization.   Follow Up Recommendations  SNF;Supervision/Assistance - 24 hour    Equipment Recommendations  Other (comment) (defer to next venue)       Precautions / Restrictions Precautions Precautions: Fall      Mobility Bed Mobility Overal bed mobility: Needs Assistance Bed Mobility: Supine to Sit;Sit to Supine Rolling: Min assist   Supine to sit: Min assist     General bed mobility comments: clumsy but min physical assist overall    Transfers Overall transfer level: Needs assistance   Transfers: Sit to/from Stand;Stand Pivot Transfers Sit to Stand: Min assist;From  elevated surface Stand pivot transfers: Min assist       General transfer comment: min A for elevated be height took 2 steps with min A for balance and steadyin g+2 assist - pt LLE weaker but difficult to assess    Balance Overall balance assessment: Needs assistance Sitting-balance support: Feet supported Sitting balance-Leahy Scale: Poor Sitting balance - Comments: intermittent LOB and required therapist assist to correct Postural control: Right lateral lean;Posterior lean Standing balance support: No upper extremity supported Standing balance-Leahy Scale: Poor Standing balance comment: reliant on +2 assist for steadying                           ADL either performed or assessed with clinical judgement   ADL Overall ADL's : Needs assistance/impaired Eating/Feeding: Supervision/ safety;Set up;Sitting   Grooming: Supervision/safety;Set up;Sitting   Upper Body Bathing: Moderate assistance;Sitting   Lower Body Bathing: Maximal assistance;+2 for physical assistance;+2 for safety/equipment;Sit to/from stand   Upper Body Dressing : Minimal assistance;Sitting   Lower Body Dressing: Maximal assistance;+2 for safety/equipment;+2 for physical assistance;Sit to/from stand   Toilet Transfer: Moderate assistance;+2 for safety/equipment;+2 for physical assistance   Toileting- Clothing Manipulation and Hygiene: Maximal assistance;+2 for physical assistance;+2 for safety/equipment;Sit to/from stand       Functional mobility during ADLs: Maximal assistance;+2 for physical assistance;+2 for safety/equipment General ADL Comments: assistance levels for weaker LLE, unsteady, impulsive, safety and verbal cues     Vision Patient Visual Report: No change from baseline Vision Assessment?: No apparent visual deficits     Perception  Praxis      Pertinent Vitals/Pain Pain Assessment: No/denies pain     Hand Dominance Right   Extremity/Trunk Assessment Upper Extremity  Assessment Upper Extremity Assessment: Overall WFL for tasks assessed;Difficult to assess due to impaired cognition (difficult to assess LUE seems limited in above head shoulder ROM)   Lower Extremity Assessment Lower Extremity Assessment: Defer to PT evaluation   Cervical / Trunk Assessment Cervical / Trunk Assessment: Kyphotic (mild)   Communication Communication Communication: HOH   Cognition Arousal/Alertness: Lethargic;Suspect due to medications Behavior During Therapy: Impulsive Overall Cognitive Status: History of cognitive impairments - at baseline                                 General Comments: following simple functional commands, pleaseantly confused throguhout session. pt tangential about the past, sentences not making much sense   General Comments  VSS on RA, wife present and supportive    Exercises     Shoulder Instructions      Home Living Family/patient expects to be discharged to:: Private residence Living Arrangements: Spouse/significant other Available Help at Discharge: Family;Available 24 hours/day Type of Home: House Home Access: Stairs to enter [employer] of Steps: 3 Entrance Stairs-Rails: Can reach both Home Layout: One level     Bathroom Shower/Tub: Tub/shower unit;Curtain   Bathroom Toilet: Handicapped height     Home Equipment: Mining engineer - 2 wheels;Walker - 4 wheels;Bedside commode;Tub bench   Additional Comments: home set up and PLOF obtained from wife      Prior Functioning/Environment Level of Independence: Independent;Independent with assistive device(s)        Comments: per pt's wife: used cane intermittently, completes ADLs indep, last time he drove was 5 months ago, wife manages his medication and fincances        OT Problem List: Decreased strength;Decreased range of motion;Decreased activity tolerance;Impaired balance (sitting and/or standing);Decreased safety awareness;Decreased  knowledge of use of DME or AE;Decreased knowledge of precautions      OT Treatment/Interventions: Self-care/ADL training;Therapeutic exercise;DME and/or AE instruction;Therapeutic activities;Patient/family education;Balance training    OT Goals(Current goals can be found in the care plan section) Acute Rehab OT Goals Patient Stated Goal: unable OT Goal Formulation: With patient/family Potential to Achieve Goals: Fair ADL Goals Pt Will Perform Grooming: with min guard assist;standing Pt Will Perform Lower Body Bathing: with min guard assist;sit to/from stand Pt Will Perform Lower Body Dressing: with min guard assist;sit to/from stand Pt Will Transfer to Toilet: with min guard assist;ambulating Pt Will Perform Toileting - Clothing Manipulation and hygiene: with min guard assist;sitting/lateral leans Pt Will Perform Tub/Shower Transfer: with min guard assist;ambulating;tub bench  OT Frequency: Min 2X/week    AM-PAC OT "6 Clicks" Daily Activity     Outcome Measure Help from another person eating meals?: A Little Help from another person taking care of personal grooming?: A Little Help from another person toileting, which includes using toliet, bedpan, or urinal?: A Lot Help from another person bathing (including washing, rinsing, drying)?: A Lot Help from another person to put on and taking off regular upper body clothing?: A Little Help from another person to put on and taking off regular lower body clothing?: A Lot 6 Click Score: 15   End of Session Equipment Utilized During Treatment: Gait belt Nurse Communication: Mobility status  Activity Tolerance: Patient tolerated treatment well Patient left: in bed;with call bell/phone within reach;with bed alarm set;with family/visitor present;with nursing/sitter  in room  OT Visit Diagnosis: Unsteadiness on feet (R26.81);Other abnormalities of gait and mobility (R26.89);Muscle weakness (generalized) (M62.81);Repeated falls (R29.6);History of  falling (Z91.81);Pain                Time: 2992-4268 OT Time Calculation (min): 16 min Charges:  OT General Charges $OT Visit: 1 Visit OT Evaluation $OT Eval Moderate Complexity: 1 Mod   Orion Vandervort A Jaevian Shean 05/26/2021, 5:14 PM

## 2021-05-26 NOTE — Progress Notes (Addendum)
STROKE TEAM PROGRESS NOTE   INTERVAL HISTORY No one is at the bedside at time of this exam. 75 year old  black male with history of atrial fibrillation on eliquis, admitted with c/o left leg weakness. MRI shows right corona radiata and righter internal capsule infarcts.  Patient was admitted last night and received Ativan and is quite sleepy and can be aroused with difficulty.  Vital signs are stable.  Vitals:   05/25/21 2300 05/26/21 0021 05/26/21 0745 05/26/21 1151  BP: (!) 122/101 (!) 140/98 (!) 140/98 (!) 131/95  Pulse: 80 80 60 (!) 54  Resp: 18 18 20 16   Temp:  98.3 F (36.8 C) 98.6 F (37 C) 98.4 F (36.9 C)  TempSrc:  Oral Axillary Axillary  SpO2: 100% 100% 100% 97%   CBC:  Recent Labs  Lab 05/25/21 1200 05/25/21 1214  WBC 7.9  --   NEUTROABS 4.6  --   HGB 12.3* 12.6*  HCT 36.3* 37.0*  MCV 100.3*  --   PLT 211  --    Basic Metabolic Panel:  Recent Labs  Lab 05/25/21 1200 05/25/21 1214 05/25/21 1751  NA 135 138 135  K 2.8* 2.7* 3.2*  CL 100 100 102  CO2 26  --  25  GLUCOSE 191* 187* 117*  BUN 13 13 13   CREATININE 1.71* 1.60* 1.58*  CALCIUM 8.9  --  8.9  MG 1.5*  --   --     Lipid Panel:  Recent Labs  Lab 05/25/21 1751  CHOL 174  TRIG 72  HDL 30*  CHOLHDL 5.8  VLDL 14  LDLCALC 130*    HgbA1c:  Recent Labs  Lab 05/25/21 1751  HGBA1C 5.8*   Urine Drug Screen: No results for input(s): LABOPIA, COCAINSCRNUR, LABBENZ, AMPHETMU, THCU, LABBARB in the last 168 hours.  Alcohol Level No results for input(s): ETH in the last 168 hours.  IMAGING past 24 hours MR ANGIO HEAD WO CONTRAST  Result Date: 05/25/2021 CLINICAL DATA:  Neuro deficit, stroke suspected EXAM: MRA NECK WITHOUT CONTRAST MRA HEAD WITHOUT CONTRAST TECHNIQUE: Angiographic images of the neck and circle of Willis were acquired using MRA technique without intravenous contrast. COMPARISON:  Correlation is made with CT head 05/25/2021 and MRI head 05/25/2021 FINDINGS: Evaluation is somewhat  limited by motion. MRA NECK FINDINGS Standard aortic branching.  No evidence dissection or aneurysm. The bilateral carotid arteries are patent, without hemodynamically significant stenosis, occlusion, or aneurysm. The origins of the vertebral arteries are not well visualized, likely secondary to artifact. The remainder of the vertebral arteries are patent. MRA HEAD FINDINGS Both internal carotid arteries are patent to the termini, without stenosis or other abnormality. A1 segments patent. Anterior cerebral arteries are patent to their distal aspects. No M1 stenosis or occlusion. Normal MCA bifurcations. Distal MCA branches appear perfused. Right dominant vertebral system. Vertebral arteries widely patent to the vertebrobasilar junction without stenosis. Basilar patent to its distal aspect. Superior cerebral arteries patent bilaterally. PCAs well perfused proximally, with poor visualization distally. IMPRESSION: Evaluation is somewhat limited by motion artifact. Within this limitation, no intracranial large vessel occlusion or hemodynamically significant stenosis in the neck. Poor opacification of the distal MCA and PCA branches, which may be secondary to technique. Electronically Signed   By: Merilyn Baba M.D.   On: 05/25/2021 18:27   MR ANGIO NECK WO CONTRAST  Result Date: 05/25/2021 CLINICAL DATA:  Neuro deficit, stroke suspected EXAM: MRA NECK WITHOUT CONTRAST MRA HEAD WITHOUT CONTRAST TECHNIQUE: Angiographic images of the neck  and circle of Willis were acquired using MRA technique without intravenous contrast. COMPARISON:  Correlation is made with CT head 05/25/2021 and MRI head 05/25/2021 FINDINGS: Evaluation is somewhat limited by motion. MRA NECK FINDINGS Standard aortic branching.  No evidence dissection or aneurysm. The bilateral carotid arteries are patent, without hemodynamically significant stenosis, occlusion, or aneurysm. The origins of the vertebral arteries are not well visualized, likely  secondary to artifact. The remainder of the vertebral arteries are patent. MRA HEAD FINDINGS Both internal carotid arteries are patent to the termini, without stenosis or other abnormality. A1 segments patent. Anterior cerebral arteries are patent to their distal aspects. No M1 stenosis or occlusion. Normal MCA bifurcations. Distal MCA branches appear perfused. Right dominant vertebral system. Vertebral arteries widely patent to the vertebrobasilar junction without stenosis. Basilar patent to its distal aspect. Superior cerebral arteries patent bilaterally. PCAs well perfused proximally, with poor visualization distally. IMPRESSION: Evaluation is somewhat limited by motion artifact. Within this limitation, no intracranial large vessel occlusion or hemodynamically significant stenosis in the neck. Poor opacification of the distal MCA and PCA branches, which may be secondary to technique. Electronically Signed   By: Merilyn Baba M.D.   On: 05/25/2021 18:27   MR BRAIN WO CONTRAST  Result Date: 05/25/2021 CLINICAL DATA:  Neuro deficit, stroke suspected, left-sided weakness EXAM: MRI HEAD WITHOUT CONTRAST TECHNIQUE: Multiplanar, multiecho pulse sequences of the brain and surrounding structures were obtained without intravenous contrast. COMPARISON:  01/27/2021. FINDINGS: Brain: Restricted diffusion in the right periventricular white matter/corona radiata (series 5, images 83-85, as well as in the right posterior limb of the internal capsule. Redemonstrated sellar/suprasellar mass, measuring 1.8 x 1.9 x 2.5 cm (AP x TR x CC), previously 1.8 x 2.0 x 2.5 cm when remeasured similarly, better evaluated on the prior exam. Grossly unchanged mass effect on prechiasmatic optic nerves. Redemonstrated 3 mm bilateral frontal convexity subdural collection. No acute hemorrhage, hydrocephalus, or midline shift. T2 hyperintense signal in the periventricular white matter, likely the sequela of chronic small vessel ischemic disease.  Vascular: Normal flow voids. Skull and upper cervical spine: Normal marrow signal. Sinuses/Orbits: Left maxillary mucous retention cyst and mucosal thickening. Left posterior ethmoid air cell mucous retention cyst. The orbits are unremarkable. Other: The mastoids are well aerated. IMPRESSION: 1. Small areas of restricted diffusion in the right corona radiata and posterior limb of the right internal capsule, consistent with acute infarcts. 2. Redemonstrated pituitary mass, better evaluated on 01/27/2021, but overall unchanged compared to the prior exam 3. 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 These results were called by telephone at the time of interpretation on 05/25/2021 at 3:54 pm to provider DAN FLOYD , who verbally acknowledged these results. Electronically Signed   By: Merilyn Baba M.D.   On: 05/25/2021 15:55   ECHOCARDIOGRAM COMPLETE BUBBLE STUDY  Result Date: 05/26/2021    ECHOCARDIOGRAM REPORT   Patient Name:   ALEKAI POCOCK Date of Exam: 05/26/2021 Medical Rec #:  [id number]     Height:       75.0 in Accession #:    [id number]    Weight:       226.6 lb Date of Birth:  1946-07-19     BSA:          2.314 m Patient Age:    66 years      BP:           140/98 mmHg Patient Gender: M  HR:           71 bpm. Exam Location:  Inpatient Procedure: 2D Echo, Color Doppler, Cardiac Doppler, Saline Contrast Bubble Study            and 3D Echo Indications:    Stroke 434.91 / I63.9  History:        Patient has no prior history of Echocardiogram examinations.                 Signs/Symptoms:Dementia; Risk Factors:Diabetes and Hypertension.                 PMHx of A. 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  Sonographer:    Darlina Sicilian RDCS Referring Phys: [id number] JULIE ANNE Jimmye Norman  Sonographer Comments: 1 mg of Ativan given for echocardiogram. IMPRESSIONS  1. Left ventricular ejection fraction, by estimation, is 20 to 25%. The left  ventricle has severely decreased function. The left ventricle demonstrates global hypokinesis. The left ventricular internal cavity size was mildly dilated. Left ventricular diastolic parameters are indeterminate. Elevated left atrial pressure.  2. Right ventricular systolic function is mildly reduced. The right ventricular size is normal.  3. Left atrial size was moderately dilated.  4. The mitral valve is grossly normal. At least moderate, eccentric mitral valve regurgitation that is functional in nature; this may be underestimated in this study. No evidence of mitral stenosis.  5. The aortic valve is tricuspid. Aortic valve regurgitation is mild.  6. The inferior vena cava is dilated in size with <50% respiratory variability, suggesting right atrial pressure of 15 mmHg.  7. Agitated saline contrast bubble study was negative, with no evidence of any interatrial shunt. Comparison(s): No prior Echocardiogram. Conclusion(s)/Recommendation(s): Per chart review; consistent with prior history noted of dilated cardioymyopathy with EF 25%. FINDINGS  Left Ventricle: Left ventricular ejection fraction, by estimation, is 20 to 25%. The left ventricle has severely decreased function. The left ventricle demonstrates global hypokinesis. 3D left ventricular ejection fraction analysis performed but not reported based on interpreter judgement due to suboptimal quality. The left ventricular internal cavity size was mildly dilated. There is no left ventricular hypertrophy. Left ventricular diastolic parameters are indeterminate. Elevated left atrial pressure. Right Ventricle: The right ventricular size is normal. No increase in right ventricular wall thickness. Right ventricular systolic function is mildly reduced. Left Atrium: Left atrial size was moderately dilated. Right Atrium: Right atrial size was normal in size. Pericardium: There is no evidence of pericardial effusion. Mitral Valve: The mitral valve is grossly normal.  Moderate mitral valve regurgitation. No evidence of mitral valve stenosis. Tricuspid Valve: The tricuspid valve is normal in structure. Tricuspid valve regurgitation is mild . No evidence of tricuspid stenosis. Aortic Valve: The aortic valve is tricuspid. Aortic valve regurgitation is mild. Pulmonic Valve: The pulmonic valve was normal in structure. Pulmonic valve regurgitation is not visualized. Aorta: The aortic root is normal in size and structure. Venous: The inferior vena cava is dilated in size with less than 50% respiratory variability, suggesting right atrial pressure of 15 mmHg. IAS/Shunts: The atrial septum is grossly normal. Agitated saline contrast was given intravenously to evaluate for intracardiac shunting. Agitated saline contrast bubble study was negative, with no evidence of any interatrial shunt.  LEFT VENTRICLE PLAX 2D LVIDd:         6.10 cm Diastology LVIDs:         5.40 cm LV e' medial:    4.24 cm/s LV PW:  1.00 cm LV E/e' medial:  20.2 LV IVS:        1.00 cm LV e' lateral:   5.08 cm/s                        LV E/e' lateral: 16.8                         3D Volume EF:                        3D EF:        30 %                        LV EDV:       190 ml                        LV ESV:       132 ml                        LV SV:        57 ml RIGHT VENTRICLE RV S prime:     9.70 cm/s TAPSE (M-mode): 1.1 cm LEFT ATRIUM              Index        RIGHT ATRIUM           Index LA diam:        4.60 cm  1.99 cm/m   RA Area:     14.70 cm LA Vol (A2C):   107.0 ml 46.24 ml/m  RA Volume:   32.60 ml  14.09 ml/m LA Vol (A4C):   86.9 ml  37.55 ml/m LA Biplane Vol: 104.0 ml 44.94 ml/m  AORTIC VALVE LVOT Vmax:   73.10 cm/s LVOT Vmean:  46.100 cm/s LVOT VTI:    0.110 m MITRAL VALVE                  TRICUSPID VALVE MV Area (PHT): 4.67 cm       TR Peak grad:   35.3 mmHg MV Decel Time: 162 msec       TR Vmax:        297.00 cm/s MR Peak grad:    94.9 mmHg MR Mean grad:    64.3 mmHg    SHUNTS MR Vmax:          487.00 cm/s  Systemic VTI: 0.11 m MR Vmean:        377.3 cm/s MR PISA:         1.57 cm MR PISA Eff ROA: 13 mm MR PISA Radius:  0.50 cm MV E velocity: 85.43 cm/s Rudean Haskell MD Electronically signed by Rudean Haskell MD Signature Date/Time: 05/26/2021/11:43:27 AM    Final     PHYSICAL EXAM Frederick elderly African-American male in moderate distress. . Afebrile. Head is nontraumatic. Neck is supple without bruit.    Cardiac exam no murmur or gallop. Lungs are clear to auscultation. Distal pulses are well felt.  Neurological Exam : Limited due to patient having received Ativan earlier.  Is quite lethargic and can barely be aroused but speech is dysarthric.  He appears disoriented and confused follows very simple commands.  Able to move all 4 extremities but is not cooperative for detailed muscle testing.  Withdraws all 4 extremities equally well  the left arm and left leg slightly less painful stimuli. ASSESSMENT/PLAN [name] is a 75 y.o. male with history of atrial fibrilation on eliquis, who presented with left sided weakness. His wife noticed he was bumping into things and dragging his left foot.     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  Small vessel disease. Atrophy.  ASPECTS 10.     MRI   1. Small areas of restricted diffusion in the right corona radiata and posterior limb of the right internal capsule, consistent with acute infarcts. 2. Redemonstrated pituitary mass, better evaluated on 01/27/2021, but overall unchanged compared to the prior exam 3. 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 MRA  no intracranial large  vessel occlusion or hemodynamically significant stenosis in the neck. Poor opacification of the distal MCA and PCA branches, which may be secondary to technique.    2D Echo EF 20-25%, global hypokinesis. Mildly dilated left atrial size, no IA shunt   LDL 130 HgbA1c 5.8 VTE prophylaxis - scd     Diet   Diet Heart Room service appropriate? Yes; Fluid consistency: Thin   Eliquis (apixaban) daily prior to admission, now on Eliquis (apixaban) daily.   Therapy recommendations:  pending Disposition:  pending  Hypertension Home meds:  metoprolol/entresto/spironolactone Stable Permissive hypertension (OK if < 220/120) but gradually normalize in 5-7 days Long-term BP goal normotensive  Hyperlipidemia Home meds:  none,  LDL 130, goal < 70 Add lipitor  80mg   High intensity statin   Continue statin at discharge  HgbA1c 5.8, goal < 7.0 CBGs Recent Labs    05/25/21 1758 05/26/21 0609 05/26/21 1113  GLUCAP 112* 115* 99    SSI  Other Stroke Risk Factors  Advanced Age >/= 65       Obesity, There is no height or weight on file to calculate BMI., BMI >/= 30 associated with increased stroke risk, recommend weight loss, diet and exercise as appropriate   Hx stroke/TIA  Congestive heart failure   I have personally obtained history,examined this patient, reviewed notes, independently viewed imaging studies, participated in medical decision making and plan of care.ROS completed by me personally and pertinent positives fully documented  I have made any additions or clarifications directly to the above note. Agree with note above.  Patient presented with weakness of left leg and difficulty walking and MRI shows right internal capsule and corona radiator lacunar infarct likely from small vessel disease.  He also has A. fib and has been compliant with his apixaban.  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  He had seen neurosurgeon in the  outpatient recommended conservative follow-up.  Recommend continue Eliquis for ongoing stroke work-up.  Therapy consults.  Do not believe changing Eliquis to alternative anticoagulant is necessary as there is no data to suggest this will be superior to continue Eliquis alone similarly and addition of aspirin is also not been shown to be better.  Long discussion with Dr. Dorian Pod and the internal medicine teaching service at the bedside and answered questions.  Greater than 50% time during this 35-minute visit was spent on counseling and coordination of care about  his lacunar stroke and pituitary tumor and answering questions.     Antony Contras, MD Medical Director [place] Pager: [phone] 05/26/2021 2:01 PM   To contact Stroke Continuity provider, please refer to [email or web address] After hours, contact General Neurology

## 2021-05-27 DIAGNOSIS — Z7189 Other specified counseling: Secondary | ICD-10-CM | POA: Diagnosis not present

## 2021-05-27 DIAGNOSIS — E11649 Type 2 diabetes mellitus with hypoglycemia without coma: Secondary | ICD-10-CM | POA: Diagnosis not present

## 2021-05-27 DIAGNOSIS — I5022 Chronic systolic (congestive) heart failure: Secondary | ICD-10-CM | POA: Diagnosis present

## 2021-05-27 DIAGNOSIS — I482 Chronic atrial fibrillation, unspecified: Secondary | ICD-10-CM | POA: Diagnosis present

## 2021-05-27 DIAGNOSIS — R339 Retention of urine, unspecified: Secondary | ICD-10-CM | POA: Diagnosis not present

## 2021-05-27 DIAGNOSIS — N179 Acute kidney failure, unspecified: Secondary | ICD-10-CM | POA: Diagnosis not present

## 2021-05-27 DIAGNOSIS — F05 Delirium due to known physiological condition: Secondary | ICD-10-CM | POA: Diagnosis present

## 2021-05-27 DIAGNOSIS — Z66 Do not resuscitate: Secondary | ICD-10-CM | POA: Diagnosis not present

## 2021-05-27 DIAGNOSIS — E440 Moderate protein-calorie malnutrition: Secondary | ICD-10-CM | POA: Diagnosis present

## 2021-05-27 DIAGNOSIS — F02C2 Dementia in other diseases classified elsewhere, severe, with psychotic disturbance: Secondary | ICD-10-CM | POA: Diagnosis present

## 2021-05-27 DIAGNOSIS — N1831 Chronic kidney disease, stage 3a: Secondary | ICD-10-CM | POA: Diagnosis present

## 2021-05-27 DIAGNOSIS — G9608 Other cranial cerebrospinal fluid leak: Secondary | ICD-10-CM | POA: Diagnosis present

## 2021-05-27 DIAGNOSIS — I6381 Other cerebral infarction due to occlusion or stenosis of small artery: Secondary | ICD-10-CM | POA: Diagnosis present

## 2021-05-27 DIAGNOSIS — D539 Nutritional anemia, unspecified: Secondary | ICD-10-CM | POA: Diagnosis present

## 2021-05-27 DIAGNOSIS — G8194 Hemiplegia, unspecified affecting left nondominant side: Secondary | ICD-10-CM | POA: Diagnosis present

## 2021-05-27 DIAGNOSIS — N136 Pyonephrosis: Secondary | ICD-10-CM | POA: Diagnosis not present

## 2021-05-27 DIAGNOSIS — Z515 Encounter for palliative care: Secondary | ICD-10-CM | POA: Diagnosis not present

## 2021-05-27 DIAGNOSIS — I340 Nonrheumatic mitral (valve) insufficiency: Secondary | ICD-10-CM | POA: Diagnosis present

## 2021-05-27 DIAGNOSIS — I5082 Biventricular heart failure: Secondary | ICD-10-CM | POA: Diagnosis present

## 2021-05-27 DIAGNOSIS — F0390 Unspecified dementia without behavioral disturbance: Secondary | ICD-10-CM | POA: Diagnosis not present

## 2021-05-27 DIAGNOSIS — I420 Dilated cardiomyopathy: Secondary | ICD-10-CM | POA: Diagnosis present

## 2021-05-27 DIAGNOSIS — I639 Cerebral infarction, unspecified: Secondary | ICD-10-CM | POA: Diagnosis not present

## 2021-05-27 DIAGNOSIS — E876 Hypokalemia: Secondary | ICD-10-CM | POA: Diagnosis present

## 2021-05-27 DIAGNOSIS — E1122 Type 2 diabetes mellitus with diabetic chronic kidney disease: Secondary | ICD-10-CM | POA: Diagnosis present

## 2021-05-27 DIAGNOSIS — I130 Hypertensive heart and chronic kidney disease with heart failure and stage 1 through stage 4 chronic kidney disease, or unspecified chronic kidney disease: Secondary | ICD-10-CM | POA: Diagnosis present

## 2021-05-27 DIAGNOSIS — E871 Hypo-osmolality and hyponatremia: Secondary | ICD-10-CM | POA: Diagnosis not present

## 2021-05-27 DIAGNOSIS — Z20822 Contact with and (suspected) exposure to covid-19: Secondary | ICD-10-CM | POA: Diagnosis present

## 2021-05-27 DIAGNOSIS — G459 Transient cerebral ischemic attack, unspecified: Secondary | ICD-10-CM | POA: Diagnosis present

## 2021-05-27 DIAGNOSIS — G3183 Dementia with Lewy bodies: Secondary | ICD-10-CM | POA: Diagnosis present

## 2021-05-27 DIAGNOSIS — D631 Anemia in chronic kidney disease: Secondary | ICD-10-CM | POA: Diagnosis present

## 2021-05-27 LAB — GLUCOSE, CAPILLARY
Glucose-Capillary: 128 mg/dL — ABNORMAL HIGH (ref 70–99)
Glucose-Capillary: 127 mg/dL — ABNORMAL HIGH (ref 70–99)
Glucose-Capillary: 157 mg/dL — ABNORMAL HIGH (ref 70–99)
Glucose-Capillary: 139 mg/dL — ABNORMAL HIGH (ref 70–99)

## 2021-05-27 LAB — BASIC METABOLIC PANEL
Anion gap: 11 (ref 5–15)
BUN: 11 mg/dL (ref 8–23)
CO2: 23 mmol/L (ref 22–32)
Calcium: 9.3 mg/dL (ref 8.9–10.3)
Chloride: 103 mmol/L (ref 98–111)
Creatinine, Ser: 1.51 mg/dL — ABNORMAL HIGH (ref 0.61–1.24)
GFR, Estimated: 48 mL/min — ABNORMAL LOW (ref >60)
Glucose, Bld: 159 mg/dL — ABNORMAL HIGH (ref 70–99)
Potassium: 3.2 mmol/L — ABNORMAL LOW (ref 3.5–5.1)
Sodium: 137 mmol/L (ref 135–145)

## 2021-05-27 MED ORDER — POTASSIUM CHLORIDE 20 MEQ PO PACK
40.0000 meq | PACK | Freq: Two times a day (BID) | ORAL | Status: AC
  Administered 2021-05-27 – 2021-05-28 (×2): 40 meq via ORAL
  Filled 2021-05-27 (×2): qty 2

## 2021-05-27 MED ORDER — METOPROLOL SUCCINATE ER 25 MG PO TB24
25.0000 mg | ORAL_TABLET | Freq: Every day | ORAL | Status: DC
  Administered 2021-05-27 – 2021-06-12 (×15): 25 mg via ORAL
  Filled 2021-05-27 (×18): qty 1

## 2021-05-27 MED ORDER — LORAZEPAM 2 MG/ML IJ SOLN
0.5000 mg | Freq: Once | INTRAMUSCULAR | Status: AC
  Administered 2021-05-27: 0.5 mg via INTRAVENOUS
  Filled 2021-05-27: qty 1

## 2021-05-27 NOTE — Progress Notes (Signed)
STROKE TEAM PROGRESS NOTE   INTERVAL HISTORY Patient required redirection last night again for agitation.  He has a Actuary at the bedside.  He can be aroused easily today and appears to be disoriented with poor attention span and not following commands consistently.  He is able to move all 4 extremities well against gravity without much focal weakness.  Vital signs are stable  Vitals:   05/26/21 2319 05/27/21 0322 05/27/21 0743 05/27/21 1208  BP: (!) 128/106 (!) 124/99 (!) 133/97 (!) 152/94  Pulse: (!) 59 (!) 110 (!) 58 73  Resp: 18 19 20 18   Temp: 98.6 F (37 C) 98 F (36.7 C) 97.6 F (36.4 C) 97.6 F (36.4 C)  TempSrc: Oral Oral Oral Oral  SpO2: 99% 99% 100% 100%   CBC:  Recent Labs  Lab 05/25/21 1200 05/25/21 1214  WBC 7.9  --   NEUTROABS 4.6  --   HGB 12.3* 12.6*  HCT 36.3* 37.0*  MCV 100.3*  --   PLT 211  --    Basic Metabolic Panel:  Recent Labs  Lab 05/25/21 1200 05/25/21 1214 05/26/21 1434 05/27/21 0409  NA 135   < > 137 137  K 2.8*   < > 3.0* 3.2*  CL 100   < > 102 103  CO2 26   < > 25 23  GLUCOSE 191*   < > 144* 159*  BUN 13   < > 9 11  CREATININE 1.71*   < > 1.46* 1.51*  CALCIUM 8.9   < > 9.3 9.3  MG 1.5*  --  1.9  --    < > = values in this interval not displayed.    Lipid Panel:  Recent Labs  Lab 05/25/21 1751  CHOL 174  TRIG 72  HDL 30*  CHOLHDL 5.8  VLDL 14  LDLCALC 130*    HgbA1c:  Recent Labs  Lab 05/25/21 1751  HGBA1C 5.8*   Urine Drug Screen: No results for input(s): LABOPIA, COCAINSCRNUR, LABBENZ, AMPHETMU, THCU, LABBARB in the last 168 hours.  Alcohol Level No results for input(s): ETH in the last 168 hours.  IMAGING past 24 hours No results found.  PHYSICAL EXAM [name] [name] elderly African-American male in moderate distress. . Afebrile. Head is nontraumatic. Neck is supple without bruit.    Cardiac exam no murmur or gallop. Lungs are clear to auscultation. Distal pulses are well felt.  Neurological Exam : Limited due to  patient having received Ativan earlier.  Is quite lethargic and can barely be aroused but speech is dysarthric.  He appears disoriented and confused follows very simple commands.  Able to move all 4 extremities but is not cooperative for detailed muscle testing.  Withdraws all 4 extremities equally well the left arm and left leg slightly less painful stimuli. ASSESSMENT/PLAN Mr. Christino Mcglinchey is a 75 y.o. male with history of atrial fibrilation on eliquis, who presented with left sided weakness. His wife noticed he was bumping into things and dragging his left foot.     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  Small vessel disease. Atrophy.  ASPECTS 10.     MRI   1. Small areas of restricted diffusion in the right corona  radiata and posterior limb of the right internal capsule, consistent with acute infarcts. 2. Redemonstrated pituitary mass, better evaluated on 01/27/2021, but overall unchanged compared to the prior exam 3. 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 MRA  no intracranial large vessel occlusion or hemodynamically significant stenosis in the neck. Poor opacification of the distal MCA and PCA branches, which may be secondary to technique.    2D Echo EF 20-25%, global hypokinesis. Mildly dilated left atrial size, no IA shunt   LDL 130 HgbA1c 5.8 VTE prophylaxis - scd     Diet   Diet Heart Room service appropriate? Yes; Fluid consistency: Thin   Eliquis (apixaban) daily prior to admission, now on Eliquis (apixaban) daily.   Therapy recommendations:  pending Disposition:  pending  Hypertension Home meds:   metoprolol/entresto/spironolactone Stable Permissive hypertension (OK if < 220/120) but gradually normalize in 5-7 days Long-term BP goal normotensive  Hyperlipidemia Home meds:  none,  LDL 130, goal < 70 Add lipitor  80mg   High intensity statin   Continue statin at discharge  HgbA1c 5.8, goal < 7.0 CBGs Recent Labs    05/26/21 2313 05/27/21 0656 05/27/21 1203  GLUCAP 129* 128* 127*    SSI  Other Stroke Risk Factors  Advanced Age >/= 65       Obesity, There is no height or weight on file to calculate BMI., BMI >/= 30 associated with increased stroke risk, recommend weight loss, diet and exercise as appropriate   Hx stroke/TIA  Congestive heart failure   Patient presented with weakness of left leg and difficulty walking and MRI shows right internal capsule and corona radiator lacunar infarct likely from small vessel disease.  He also has A. fib and has been compliant with his apixaban.  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  He had seen neurosurgeon in the outpatient recommended conservative follow-up.  Recommend continue Eliquis and ongoing therapies..  Do not believe changing Eliquis to alternative anticoagulant is necessary as there is no data to suggest this will be superior to continue Eliquis alone similarly and addition of aspirin is also not been shown to be better.  Transfer to skilled nursing facility for rehab when medically stable.  Stroke team will sign off.  Kindly call for questions    Antony Contras, MD Medical Director [facility] Pager: [phone] 05/27/2021 12:53 PM   To contact Stroke Continuity provider, please refer to [email or web address] After hours, contact General Neurology

## 2021-05-27 NOTE — Progress Notes (Signed)
Physical Therapy Treatment Patient Details Name: [name] [name] MRN: [id number] DOB: 1945/10/14 Today's Date: 05/27/2021   History of Present Illness [name] is a 75 y/o male with a PMHx of A. 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 [name] has dementia with short-term memory deficits at baseline.  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    PT Comments    Patient more interactive and able to follow commands today.  Ambulated in hallway, but with poor L side awareness scissoring on L and poor balance needing mod to max A for safety.  Given cognitive issues and patients wife unable to give physical assist feel STSNF most appropriate post-acute rehab venue at this time.  PT will continue to follow.    Recommendations for follow up therapy are one component of a multi-disciplinary discharge planning process, led by the attending physician.  Recommendations may be updated based on patient status, additional functional criteria and insurance authorization.  Follow Up Recommendations  SNF     Equipment Recommendations  None recommended by PT    Recommendations for Other Services       Precautions / Restrictions Precautions Precautions: Fall Precaution Comments: decreased L awareness Restrictions Weight Bearing Restrictions: No     Mobility  Bed Mobility Overal bed mobility: Needs Assistance Bed Mobility: Supine to Sit;Sit to Supine     Supine to sit: Supervision;HOB elevated Sit to supine: Supervision   General bed mobility comments: getting up without assist, S for safety    Transfers Overall transfer level: Needs assistance Equipment used: Rolling [name] (2 wheeled) Transfers: Sit to/from Stand Sit to Stand: Min assist         General transfer comment: initated on his own, but needed assist to complete getting up and anterior weight  shift  Ambulation/Gait Ambulation/Gait assistance: Mod assist;Max assist Gait Distance (Feet): 50 Feet Assistive device: Rolling [name] (2 wheeled) Gait Pattern/deviations: Step-through pattern;Decreased stride length;Scissoring;Drifts right/left;Narrow base of support;Ataxic     General Gait Details: L foot crossing midline at times and with heavy assist to prevent L lateral LOB and to keep [name] in good position for assist for balance   Stairs             Wheelchair Mobility    Modified Rankin (Stroke Patients Only) Modified Rankin (Stroke Patients Only) Pre-Morbid Rankin Score: No significant disability Modified Rankin: Moderately severe disability     Balance Overall balance assessment: Needs assistance   Sitting balance-Leahy Scale: Good Sitting balance - Comments: leans head way back to swallow pills, but no LOB     Standing balance-Leahy Scale: Poor Standing balance comment: min A with UE support for static balance, dynamic balance with assist for safety and UE support                            Cognition Arousal/Alertness: Awake/alert Behavior During Therapy: Restless Overall Cognitive Status: Impaired/Different from baseline Area of Impairment: Orientation;Memory;Safety/judgement;Problem solving;Following commands                 Orientation Level: Disoriented to;Time;Situation   Memory: Decreased short-term memory Following Commands: Follows one step commands with increased time;Follows one step commands consistently Safety/Judgement: Decreased awareness of safety;Decreased awareness of deficits   Problem Solving: Slow processing;Difficulty sequencing;Requires verbal cues;Requires tactile cues General Comments: today aware he was in hospital, asked if it  was Aug 22 since that is his aniversary, RN reports he had asked to use the bathroom and they got to [facility] since hadn't walked with PT, but he did not see that as an appropriate place to  use the bathroom      Exercises      General Comments General comments (skin integrity, edema, etc.): brushed teeth in bed HOB up with set up      Pertinent Vitals/Pain Pain Assessment: No/denies pain    Home Living                      Prior Function            PT Goals (current goals can now be found in the care plan section) Progress towards PT goals: Progressing toward goals    Frequency    Min 3X/week      PT Plan Current plan remains appropriate    Co-evaluation              AM-PAC PT "6 Clicks" Mobility   Outcome Measure  Help needed turning from your back to your side while in a flat bed without using bedrails?: None Help needed moving from lying on your back to sitting on the side of a flat bed without using bedrails?: None Help needed moving to and from a bed to a chair (including a wheelchair)?: A Little Help needed standing up from a chair using your arms (e.g., wheelchair or bedside chair)?: A Little Help needed to walk in hospital room?: A Lot Help needed climbing 3-5 steps with a railing? : Total 6 Click Score: 17    End of Session Equipment Utilized During Treatment: Gait belt Activity Tolerance: Patient tolerated treatment well Patient left: in bed;with call bell/phone within reach;with bed alarm set;with nursing/sitter in room   PT Visit Diagnosis: Other abnormalities of gait and mobility (R26.89);Hemiplegia and hemiparesis;Muscle weakness (generalized) (M62.81) Hemiplegia - Right/Left: Left Hemiplegia - dominant/non-dominant: Non-dominant Hemiplegia - caused by: Cerebral infarction     Time: 1020-1050 PT Time Calculation (min) (ACUTE ONLY): 30 min  Charges:  $Gait Training: 8-22 mins $Therapeutic Activity: 8-22 mins                     Magda Kiel, PT Acute Rehabilitation Services [phone] Office:[phone] 05/27/2021    Reginia Naas 05/27/2021, 11:06 AM

## 2021-05-27 NOTE — TOC Initial Note (Signed)
Transition of Care [facility]) - Initial/Assessment Note    Patient Details  Name: Johnel Yielding MRN: [id number] Date of Birth: November 15, 1945  Transition of Care [facility]) CM/SW Contact:    Geralynn Ochs, LCSW Phone Number: 05/27/2021, 2:07 PM  Clinical Narrative:        CSW spoke with patient's spouse, Leda Gauze, via phone to discuss recommendation for SNF. Spouse in agreement, hopeful for SNF closer to [place], where they live. Patient has had all four covid vaccines, records are with the [place] in [place]. Patient also has Medicare, but only Part A. Wife to provide copy of Medicare card when she can find it. CSW to fax out referral and will follow for bed offers.            Expected Discharge Plan: Skilled Nursing Facility Barriers to Discharge: Continued Medical Work up, Requiring sitter/restraints   Patient Goals and CMS Choice Patient states their goals for this hospitalization and ongoing recovery are:: patient unable to participate in goal setting, only oriented to self CMS Medicare.gov Compare Post Acute Care list provided to:: Patient Represenative (must comment) Choice offered to / list presented to : Spouse  Expected Discharge Plan and Services Expected Discharge Plan: [place] Acute Care Choice: [place] Living arrangements for the past 2 months: Single Family Home                                      Prior Living Arrangements/Services Living arrangements for the past 2 months: Single Family Home Lives with:: Spouse Patient language and need for interpreter reviewed:: No Do you feel safe going back to the place where you live?: Yes      Need for Family Participation in Patient Care: Yes (Comment) Care giver support system in place?: No (comment) Current home services: DME Criminal Activity/Legal Involvement Pertinent to Current Situation/Hospitalization: No - Comment as needed  Activities of Daily Living Home Assistive  Devices/Equipment: None ADL Screening (condition at time of admission) Patient's cognitive ability adequate to safely complete daily activities?: No Is the patient deaf or have difficulty hearing?: No Does the patient have difficulty seeing, even when wearing glasses/contacts?: No Does the patient have difficulty concentrating, remembering, or making decisions?: Yes Patient able to express need for assistance with ADLs?: Yes Does the patient have difficulty dressing or bathing?: No Independently performs ADLs?: Yes (appropriate for developmental age) Does the patient have difficulty walking or climbing stairs?: Yes Weakness of Legs: Both Weakness of Arms/Hands: None  Permission Sought/Granted Permission sought to share information with : Facility Sport and exercise psychologist, Family Supports Permission granted to share information with : Yes, Verbal Permission Granted  Share Information with NAME: Leda Gauze  Permission granted to share info w AGENCY: SNF  Permission granted to share info w Relationship: Spouse     Emotional Assessment   Attitude/Demeanor/Rapport: Unable to Assess Affect (typically observed): Unable to Assess Orientation: : Oriented to Self Alcohol / Substance Use: Not Applicable Psych Involvement: No (comment)  Admission diagnosis:  Hypokalemia [E87.6] Hypomagnesemia [E83.42] Transient ischemic attack (TIA) [G45.9] Stroke, acute, embolic ([place]) [O13.0] TIA (transient ischemic attack) [G45.9] Patient Active Problem List   Diagnosis Date Noted   TIA (transient ischemic attack) 05/27/2021   Acute CVA (cerebrovascular accident) ([place]) 05/26/2021   Osteoarthritis of right knee 08/04/2017   PCP:  Clinic, [place]:   [id number] - [place], [place] - [phone]  [address] [place] [address] Phone: [phone] Fax: [phone]     Social Determinants of Health (SDOH) Interventions    Readmission Risk Interventions No  flowsheet data found.

## 2021-05-27 NOTE — Plan of Care (Signed)

## 2021-05-27 NOTE — Progress Notes (Signed)
HD#0 SUBJECTIVE:  Patient Summary: [name] [name] is a 75 y.o. with a pertinent PMH of A. Fib on Eliquis, HFrEF (EF 25% 05/26/21)  2/2 dilated cardiomyopathy, HTN, T2DM, and dementia who presents to the ED with c/o left leg weakness and admitted for acute lacunar CVA on hospital day 1.   Overnight Events: night team was paged by RN for agitation and inability to redirect.  Evaluated at bedside, patient was placed back in bed and appears comfortable.  States that he was tired but denies any pain or discomfort.  No Ativan was given at that time time due to successful reorientation.  Interim History: Patient evaluated at bedside this AM and remains pleasantly demented on exam.  Was paged around 1730 due to patient not being redirectable. Evaluated patient at bedside and he was actively trying to get out of bed with RN and Nurse Tech  trying to redirect him.  He stated that he needed to go home to go to the grocery store.  He was briefly redirectable with conversation, but began to try to get out of bed again.  Nurse tech stated she had been working with him and talking to him with redirection since 1600.  Was trying to avoid sedation, however patient remains unredirectable. Ordered ativan 0.5 mg.   OBJECTIVE:  Vital Signs: Vitals:   05/26/21 1552 05/26/21 2045 05/26/21 2319 05/27/21 0322  BP: 125/90 (!) 163/114 (!) 128/106 (!) 124/99  Pulse: 69 71 (!) 59 (!) 110  Resp: 18 16 18 19   Temp: 98.5 F (36.9 C) (!) 97.5 F (36.4 C) 98.6 F (37 C) 98 F (36.7 C)  TempSrc: Axillary Oral Oral Oral  SpO2: 93% 96% 99% 99%   Supplemental O2: Room Air SpO2: 99 %  There were no vitals filed for this visit.   Intake/Output Summary (Last 24 hours) at 05/27/2021 4982 Last data filed at 05/26/2021 1408 Gross per 24 hour  Intake 240 ml  Output 600 ml  Net -360 ml   Net IO Since Admission: -332 mL [05/27/21 0613]  Physical Exam: General: Elderly man, in no acute distress, comfortably resting in  bed HENT: NCAT Eyes: no scleral icterus, conjunctiva clear CV:no murmurs appreciated,  normal rate Pulm: CTAB, normal pulmonary effort GI: Abdomen is soft, non distended MSK: No edema in lower extremities bilaterally, normal muscle tone Skin: Warm and dry, no rashes noted Neuro: no cogwheel rigidity arms bilaterally, alerted, remains no oriented to place or time.   Patient Lines/Drains/Airways Status     Active Line/Drains/Airways     Name Placement date Placement time Site Days   Peripheral IV 03/01/20 Anterior;Left;Proximal Forearm 03/01/20  1430  Forearm  452   Peripheral IV 05/25/21 20 G Left Antecubital 05/25/21  1226  Antecubital  2   External Urinary Catheter 05/26/21  1342  --  1   Incision (Closed) 08/04/17 Knee Right 08/04/17  1354  -- 1392            Pertinent Labs: CBC Latest Ref Rng & Units 05/25/2021 05/25/2021 03/01/2020  WBC 4.0 - 10.5 K/uL - 7.9 6.0  Hemoglobin 13.0 - 17.0 g/dL 12.6(L) 12.3(L) 11.5(L)  Hematocrit 39.0 - 52.0 % 37.0(L) 36.3(L) 32.5(L)  Platelets 150 - 400 K/uL - 211 223    CMP Latest Ref Rng & Units 05/27/2021 05/26/2021 05/25/2021  Glucose 70 - 99 mg/dL 159(H) 144(H) 117(H)  BUN 8 - 23 mg/dL 11 9 13   Creatinine 0.61 - 1.24 mg/dL 1.51(H) 1.46(H) 1.58(H)  Sodium 135 -  145 mmol/L 137 137 135  Potassium 3.5 - 5.1 mmol/L 3.2(L) 3.0(L) 3.2(L)  Chloride 98 - 111 mmol/L 103 102 102  CO2 22 - 32 mmol/L 23 25 25   Calcium 8.9 - 10.3 mg/dL 9.3 9.3 8.9  Total Protein 6.5 - 8.1 g/dL - - -  Total Bilirubin 0.3 - 1.2 mg/dL - - -  Alkaline Phos 38 - 126 U/L - - -  AST 15 - 41 U/L - - -  ALT 0 - 44 U/L - - -    Recent Labs    05/26/21 1113 05/26/21 1541 05/26/21 2313  GLUCAP 99 159* 129*     Pertinent Imaging: ECHOCARDIOGRAM COMPLETE BUBBLE STUDY  Result Date: 05/26/2021    ECHOCARDIOGRAM REPORT   Patient Name:   Moises Blood Date of Exam: 05/26/2021 Medical Rec #:  [id number]     Height:       75.0 in Accession #:    [id number]    Weight:        226.6 lb Date of Birth:  November 24, 1945     BSA:          2.314 m Patient Age:    30 years      BP:           140/98 mmHg Patient Gender: M             HR:           71 bpm. Exam Location:  Inpatient Procedure: 2D Echo, Color Doppler, Cardiac Doppler, Saline Contrast Bubble Study            and 3D Echo Indications:    Stroke 434.91 / I63.9  History:        Patient has no prior history of Echocardiogram examinations.                 Signs/Symptoms:Dementia; Risk Factors:Diabetes and Hypertension.                 PMHx of A. 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  Sonographer:    Darlina Sicilian RDCS Referring Phys: [phone] JULIE ANNE Jimmye Norman  Sonographer Comments: 1 mg of Ativan given for echocardiogram. IMPRESSIONS  1. Left ventricular ejection fraction, by estimation, is 20 to 25%. The left ventricle has severely decreased function. The left ventricle demonstrates global hypokinesis. The left ventricular internal cavity size was mildly dilated. Left ventricular diastolic parameters are indeterminate. Elevated left atrial pressure.  2. Right ventricular systolic function is mildly reduced. The right ventricular size is normal.  3. Left atrial size was moderately dilated.  4. The mitral valve is grossly normal. At least moderate, eccentric mitral valve regurgitation that is functional in nature; this may be underestimated in this study. No evidence of mitral stenosis.  5. The aortic valve is tricuspid. Aortic valve regurgitation is mild.  6. The inferior vena cava is dilated in size with <50% respiratory variability, suggesting right atrial pressure of 15 mmHg.  7. Agitated saline contrast bubble study was negative, with no evidence of any interatrial shunt. Comparison(s): No prior Echocardiogram. Conclusion(s)/Recommendation(s): Per chart review; consistent with prior history noted of dilated cardioymyopathy with EF 25%. FINDINGS  Left Ventricle: Left ventricular ejection fraction, by estimation, is 20 to  25%. The left ventricle has severely decreased function. The left ventricle demonstrates global hypokinesis. 3D left ventricular ejection fraction analysis performed but not reported based on interpreter judgement due to suboptimal quality. The left ventricular internal cavity size was mildly dilated. There is  no left ventricular hypertrophy. Left ventricular diastolic parameters are indeterminate. Elevated left atrial pressure. Right Ventricle: The right ventricular size is normal. No increase in right ventricular wall thickness. Right ventricular systolic function is mildly reduced. Left Atrium: Left atrial size was moderately dilated. Right Atrium: Right atrial size was normal in size. Pericardium: There is no evidence of pericardial effusion. Mitral Valve: The mitral valve is grossly normal. Moderate mitral valve regurgitation. No evidence of mitral valve stenosis. Tricuspid Valve: The tricuspid valve is normal in structure. Tricuspid valve regurgitation is mild . No evidence of tricuspid stenosis. Aortic Valve: The aortic valve is tricuspid. Aortic valve regurgitation is mild. Pulmonic Valve: The pulmonic valve was normal in structure. Pulmonic valve regurgitation is not visualized. Aorta: The aortic root is normal in size and structure. Venous: The inferior vena cava is dilated in size with less than 50% respiratory variability, suggesting right atrial pressure of 15 mmHg. IAS/Shunts: The atrial septum is grossly normal. Agitated saline contrast was given intravenously to evaluate for intracardiac shunting. Agitated saline contrast bubble study was negative, with no evidence of any interatrial shunt.  LEFT VENTRICLE PLAX 2D LVIDd:         6.10 cm Diastology LVIDs:         5.40 cm LV e' medial:    4.24 cm/s LV PW:         1.00 cm LV E/e' medial:  20.2 LV IVS:        1.00 cm LV e' lateral:   5.08 cm/s                        LV E/e' lateral: 16.8                         3D Volume EF:                        3D EF:         30 %                        LV EDV:       190 ml                        LV ESV:       132 ml                        LV SV:        57 ml RIGHT VENTRICLE RV S prime:     9.70 cm/s TAPSE (M-mode): 1.1 cm LEFT ATRIUM              Index        RIGHT ATRIUM           Index LA diam:        4.60 cm  1.99 cm/m   RA Area:     14.70 cm LA Vol (A2C):   107.0 ml 46.24 ml/m  RA Volume:   32.60 ml  14.09 ml/m LA Vol (A4C):   86.9 ml  37.55 ml/m LA Biplane Vol: 104.0 ml 44.94 ml/m  AORTIC VALVE LVOT Vmax:   73.10 cm/s LVOT Vmean:  46.100 cm/s LVOT VTI:    0.110 m MITRAL VALVE  TRICUSPID VALVE MV Area (PHT): 4.67 cm       TR Peak grad:   35.3 mmHg MV Decel Time: 162 msec       TR Vmax:        297.00 cm/s MR Peak grad:    94.9 mmHg MR Mean grad:    64.3 mmHg    SHUNTS MR Vmax:         487.00 cm/s  Systemic VTI: 0.11 m MR Vmean:        377.3 cm/s MR PISA:         1.57 cm MR PISA Eff ROA: 13 mm MR PISA Radius:  0.50 cm MV E velocity: 85.43 cm/s Rudean Haskell MD Electronically signed by Rudean Haskell MD Signature Date/Time: 05/26/2021/11:43:27 AM    Final     ASSESSMENT/PLAN:  Assessment: Principal Problem:   Acute CVA (cerebrovascular accident) ([place])  Markon Kathol is a 75 y.o. with a pertinent PMH of A. Fib on Eliquis, HFrEF (EF 25% 05/26/21)  2/2 dilated cardiomyopathy, HTN, T2DM, and dementia who presents to the ED with c/o left leg weakness and admitted for acute lacunar CVA on hospital day 1.   Plan: #Acute lacunar infarct secondary to small vessel disease MRA head and neck with no evidence of large vessel occlusion.  Echo negative for thrombus.  Given location and size, likely secondary to small vessel disease. PT/OT recommending SNF; patient's wife is in agreement.   Patient rehab unable to evaluate patient today due to agitation.  Will touch base with TOC for SNF placement. -SNF placement -Neurology following; appreciate their recommendations -Home medications  include metoprolol, entresto, and spironolactone.  Restarted Metoprolol 25 mg today, will monitor vitals to determine if increase to home dosage is appropriate. -High intensity statin, atorvastatin 80 mg daily -Continue working on risk management(HTN, T2DM)  #Dementia Given patient's development of visual hallucinations over the last 6 months in addition to fluctuating cognition with impaired short and long-term memory, high suspicion for Lewy body dementia. No cogwheel rigidity on exam.   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  LV was mildly dilated.  Patient did have some evidence of mitral regurgitation, however only moderate.  Recent left heart cath done at the [facility] hospital demonstrated nonobstructive CAD.  Etiology of cardiomyopathy undetermined at this time.  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  Current guideline directed medical therapy includes metoprolol, Entresto, and spironolactone.  Holding at this time to allow for permissive hypertension.   - Monitor volume status - restarted metoprolol at 25 mg today  # Hypokalemia  K of 3.2 this morning. Etiology undetermined at this time, especially given patient is only on potassium sparing diuretic and Entresto.   - Monitor daily and replete as needed  Best Practice: Diet: Cardiac diet IVF: Fluids: none VTE:  Code: Full AB: none Therapy Recs: SNF Family Contact: updated wife via phone, will give her another update on SNF placement status tomorrow DISPO: Anticipated discharge to Skilled nursing facility pending  skilled nursing facility placement .  Signature: Christiana Fuchs, D.O. Internal Medicine Resident, PGY-1 [facility]  Pager: [phone] 6:13 AM, 05/27/2021   Please contact the on call pager after 5 pm and on weekends at [phone].

## 2021-05-27 NOTE — NC FL2 (Signed)
[place] LEVEL OF CARE SCREENING TOOL     IDENTIFICATION  Patient Name: [name] [name] Birthdate: 10-Jun-1946 Sex: male Admission Date (Current Location): 05/25/2021  [employer] and [place] Number:  Herbalist and Address:  [facility], [place] [address] [place], [place] [address]      Provider Number: [id number]  Attending Physician Name and Address:  Angelica Pou, MD  Relative Name and Phone Number:       Current Level of Care: Hospital Recommended Level of Care: [place] Prior Approval Number:    Date Approved/Denied:   PASRR Number: [id number]  Discharge Plan: SNF    Current Diagnoses: Patient Active Problem List   Diagnosis Date Noted   TIA (transient ischemic attack) 05/27/2021   Acute CVA (cerebrovascular accident) ([place]) 05/26/2021   Osteoarthritis of right knee 08/04/2017    Orientation RESPIRATION BLADDER Height & Weight     Self  Normal Incontinent Weight:   Height:     BEHAVIORAL SYMPTOMS/MOOD NEUROLOGICAL BOWEL NUTRITION STATUS      Incontinent Diet (heart healthy)  AMBULATORY STATUS COMMUNICATION OF NEEDS Skin   Limited Assist Verbally Normal                       Personal Care Assistance Level of Assistance  Bathing, Feeding, Dressing Bathing Assistance: Limited assistance Feeding assistance: Independent Dressing Assistance: Limited assistance     Functional Limitations Info  Sight Sight Info: Impaired        SPECIAL CARE FACTORS FREQUENCY  PT (By licensed PT), OT (By licensed OT)     PT Frequency: 5x/wk OT Frequency: 5x/wk            Contractures Contractures Info: Not present    Additional Factors Info  Code Status, Allergies, Insulin Sliding Scale Code Status Info: Full Allergies Info: NKA   Insulin Sliding Scale Info: see DC summary       Current Medications (05/27/2021):  This is the current hospital active medication list Current  Facility-Administered Medications  Medication Dose Route Frequency Provider Last Rate Last Admin   acetaminophen (TYLENOL) tablet 650 mg  650 mg Oral Q6H PRN Jose Persia, MD       Or   acetaminophen (TYLENOL) suppository 650 mg  650 mg Rectal Q6H PRN Jose Persia, MD       apixaban (ELIQUIS) tablet 5 mg  5 mg Oral BID Jose Persia, MD   5 mg at 05/27/21 1023   atorvastatin (LIPITOR) tablet 80 mg  80 mg Oral Daily Jose Persia, MD   80 mg at 05/27/21 1023   brinzolamide (AZOPT) 1 % ophthalmic suspension 1 drop  1 drop Both Eyes TID Jose Persia, MD   1 drop at 05/27/21 1050   And   brimonidine (ALPHAGAN) 0.2 % ophthalmic solution 1 drop  1 drop Both Eyes TID Jose Persia, MD   1 drop at 05/27/21 1049   finasteride (PROSCAR) tablet 5 mg  5 mg Oral Daily Jose Persia, MD   5 mg at 05/27/21 1023   insulin aspart (novoLOG) injection 0-9 Units  0-9 Units Subcutaneous TID WC Jose Persia, MD   1 Units at 05/27/21 1218   latanoprost (XALATAN) 0.005 % ophthalmic solution 1 drop  1 drop Both Eyes QHS Jose Persia, MD       metoprolol succinate (TOPROL-XL) 24 hr tablet 25 mg  25 mg Oral Daily Jose Persia, MD   25 mg at 05/27/21  1023   polyethylene glycol (MIRALAX / GLYCOLAX) packet 17 g  17 g Oral Daily PRN Jose Persia, MD       sodium chloride flush (NS) 0.9 % injection 3 mL  3 mL Intravenous Q12H Jose Persia, MD   3 mL at 05/27/21 1056   tamsulosin (FLOMAX) capsule 0.4 mg  0.4 mg Oral Daily Jose Persia, MD   0.4 mg at 05/27/21 1023     Discharge Medications: Please see discharge summary for a list of discharge medications.  Relevant Imaging Results:  Relevant Lab Results:   Additional Information SS#: [id number]  Geralynn Ochs, LCSW

## 2021-05-27 NOTE — Progress Notes (Signed)
Inpatient Rehab Admissions Coordinator:   Pt was screened for CIR candidacy per PT recommendations by Shann Medal, PT, DPT. Note PT eval limited, but based on mobility with OT and diagnosis of R CR/PLIC acute infarcts feel he could be a great candidate for CIR. I will request an order per our protocol so we can fully evaluate him.    Shann Medal, PT, DPT Admissions Coordinator [phone] 05/27/21  10:49 AM

## 2021-05-28 DIAGNOSIS — I639 Cerebral infarction, unspecified: Secondary | ICD-10-CM | POA: Diagnosis not present

## 2021-05-28 LAB — CBC
HCT: 35.7 % — ABNORMAL LOW (ref 39.0–52.0)
Hemoglobin: 12.7 g/dL — ABNORMAL LOW (ref 13.0–17.0)
MCH: 34.5 pg — ABNORMAL HIGH (ref 26.0–34.0)
MCHC: 35.6 g/dL (ref 30.0–36.0)
MCV: 97 fL (ref 80.0–100.0)
Platelets: 217 10*3/uL (ref 150–400)
RBC: 3.68 MIL/uL — ABNORMAL LOW (ref 4.22–5.81)
RDW: 13.1 % (ref 11.5–15.5)
WBC: 13.1 10*3/uL — ABNORMAL HIGH (ref 4.0–10.5)
nRBC: 0 % (ref 0.0–0.2)

## 2021-05-28 LAB — GLUCOSE, CAPILLARY
Glucose-Capillary: 127 mg/dL — ABNORMAL HIGH (ref 70–99)
Glucose-Capillary: 130 mg/dL — ABNORMAL HIGH (ref 70–99)
Glucose-Capillary: 149 mg/dL — ABNORMAL HIGH (ref 70–99)
Glucose-Capillary: 118 mg/dL — ABNORMAL HIGH (ref 70–99)

## 2021-05-28 LAB — BASIC METABOLIC PANEL
Anion gap: 12 (ref 5–15)
BUN: 12 mg/dL (ref 8–23)
CO2: 22 mmol/L (ref 22–32)
Calcium: 9.6 mg/dL (ref 8.9–10.3)
Chloride: 103 mmol/L (ref 98–111)
Creatinine, Ser: 1.62 mg/dL — ABNORMAL HIGH (ref 0.61–1.24)
GFR, Estimated: 44 mL/min — ABNORMAL LOW (ref >60)
Glucose, Bld: 122 mg/dL — ABNORMAL HIGH (ref 70–99)
Potassium: 3.4 mmol/L — ABNORMAL LOW (ref 3.5–5.1)
Sodium: 137 mmol/L (ref 135–145)

## 2021-05-28 MED ORDER — RIVASTIGMINE 4.6 MG/24HR TD PT24
4.6000 mg | MEDICATED_PATCH | Freq: Every day | TRANSDERMAL | Status: DC
  Administered 2021-05-28 – 2021-06-11 (×14): 4.6 mg via TRANSDERMAL
  Filled 2021-05-28 (×16): qty 1

## 2021-05-28 MED ORDER — LORAZEPAM 2 MG/ML IJ SOLN
0.5000 mg | Freq: Once | INTRAMUSCULAR | Status: AC
  Administered 2021-05-29: 0.5 mg via INTRAVENOUS

## 2021-05-28 MED ORDER — POTASSIUM CHLORIDE 20 MEQ PO PACK
40.0000 meq | PACK | Freq: Two times a day (BID) | ORAL | Status: DC
  Administered 2021-05-28: 40 meq via ORAL
  Filled 2021-05-28: qty 2

## 2021-05-28 MED ORDER — LORAZEPAM 2 MG/ML IJ SOLN
0.5000 mg | Freq: Once | INTRAMUSCULAR | Status: DC
  Filled 2021-05-28 (×2): qty 1

## 2021-05-28 MED ORDER — POTASSIUM CHLORIDE 20 MEQ PO PACK: 80.0000 meq | PACK | Freq: Once | ORAL | Status: DC

## 2021-05-28 MED ORDER — QUETIAPINE FUMARATE 25 MG PO TABS
25.0000 mg | ORAL_TABLET | Freq: Every day | ORAL | Status: DC
  Filled 2021-05-28: qty 1

## 2021-05-28 MED ORDER — MIRTAZAPINE 15 MG PO TABS
7.5000 mg | ORAL_TABLET | Freq: Every day | ORAL | Status: DC
  Administered 2021-05-28 – 2021-07-02 (×34): 7.5 mg via ORAL
  Filled 2021-05-28 (×37): qty 1

## 2021-05-28 MED ORDER — QUETIAPINE FUMARATE 25 MG PO TABS
25.0000 mg | ORAL_TABLET | Freq: Once | ORAL | Status: AC
  Administered 2021-05-28: 25 mg via ORAL
  Filled 2021-05-28: qty 1

## 2021-05-28 MED ORDER — LORAZEPAM 2 MG/ML IJ SOLN
0.5000 mg | Freq: Once | INTRAMUSCULAR | Status: AC
  Administered 2021-05-28: 0.5 mg via INTRAVENOUS
  Filled 2021-05-28: qty 1

## 2021-05-28 NOTE — Progress Notes (Signed)
Notified on call rounding team of increased agitation/confusion and constant and continuously getting up out of the bed. There is not a Air cabin crew available/assigned  and this nurse has been sitting in the room b/c of the increased frequency of getting up out of bed and he is a very high fall risk. Multiple attempts unsuccessful to reorient him and redirect.

## 2021-05-28 NOTE — Plan of Care (Signed)

## 2021-05-28 NOTE — Progress Notes (Signed)
HD#2 SUBJECTIVE:  Patient Summary: [name] [name] is a 75 y.o. with a pertinent PMH of A. Fib on Eliquis, HFrEF (EF 25% 05/26/21)  2/2 dilated cardiomyopathy, HTN, T2DM, and dementia who presents to the ED with c/o left leg weakness and admitted for acute lacunar CVA on hospital day 2.   Overnight Events: night team was paged by RN for agitation and inability to redirect.  Evaluated at bedside, patient was placed back in bed and appears comfortable.  States that he was tired but denies any pain or discomfort.  No Ativan was given at that time time due to successful reorientation.  Interim History: Patient evaluated at bedside this AM and remains pleasantly demented on exam. He was seen with legs hanging off of bedside rail, gown overhead and patient is actively trying to get off, respiration unlabored, and alert.   Speech dysarthric  Was paged around 1630 due to patient not being redirectable. Evaluated patient at bedside and he is actively trying to get out of bed.  Spoke with nurse and she stated that he had been trying to hit staff members.  0.5 mg Ativan ordered.  Will also order Seroquel 25 mg.   OBJECTIVE:  Vital Signs: Vitals:   05/27/21 1948 05/27/21 2320 05/28/21 0326 05/28/21 0800  BP: 110/79 122/81 (!) 121/97 (!) 138/114  Pulse: (!) 53 (!) 52 (!) 58 61  Resp: 17 17 18 18   Temp: 97.7 F (36.5 C) (!) 97.5 F (36.4 C) 98 F (36.7 C) (!) 97.5 F (36.4 C)  TempSrc: Oral Oral Oral Oral  SpO2: 100% 100% 99% 98%   Supplemental O2: Room Air SpO2: 98 %  There were no vitals filed for this visit.   Intake/Output Summary (Last 24 hours) at 05/28/2021 0920 Last data filed at 05/27/2021 1725 Gross per 24 hour  Intake 240 ml  Output --  Net 240 ml    Net IO Since Admission: 148 mL [05/28/21 0920]  Physical Exam: General: Elderly man, in no acute distress, agitated, actively trying to get out of bed HENT: NCAT Eyes: no scleral icterus, conjunctiva clear CV:no murmurs  appreciated,  normal rate Pulm: CTAB, normal pulmonary effort GI: Abdomen is soft, non distended MSK: No edema in lower extremities bilaterally, normal muscle tone Skin: Warm and dry, no rashes noted Neuro: no cogwheel rigidity arms bilaterally, patient answers questions inappropriately, not oriented to time place but to person  Patient Lines/Drains/Airways Status     Active Line/Drains/Airways     Name Placement date Placement time Site Days   Peripheral IV 03/01/20 Anterior;Left;Proximal Forearm 03/01/20  1430  Forearm  452   Peripheral IV 05/25/21 20 G Left Antecubital 05/25/21  1226  Antecubital  2   External Urinary Catheter 05/26/21  1342  --  1   Incision (Closed) 08/04/17 Knee Right 08/04/17  1354  -- 1392            Pertinent Labs: CBC Latest Ref Rng & Units 05/25/2021 05/25/2021 03/01/2020  WBC 4.0 - 10.5 K/uL - 7.9 6.0  Hemoglobin 13.0 - 17.0 g/dL 12.6(L) 12.3(L) 11.5(L)  Hematocrit 39.0 - 52.0 % 37.0(L) 36.3(L) 32.5(L)  Platelets 150 - 400 K/uL - 211 223    CMP Latest Ref Rng & Units 05/27/2021 05/26/2021 05/25/2021  Glucose 70 - 99 mg/dL 159(H) 144(H) 117(H)  BUN 8 - 23 mg/dL 11 9 13   Creatinine 0.61 - 1.24 mg/dL 1.51(H) 1.46(H) 1.58(H)  Sodium 135 - 145 mmol/L 137 137 135  Potassium 3.5 -  5.1 mmol/L 3.2(L) 3.0(L) 3.2(L)  Chloride 98 - 111 mmol/L 103 102 102  CO2 22 - 32 mmol/L 23 25 25   Calcium 8.9 - 10.3 mg/dL 9.3 9.3 8.9  Total Protein 6.5 - 8.1 g/dL - - -  Total Bilirubin 0.3 - 1.2 mg/dL - - -  Alkaline Phos 38 - 126 U/L - - -  AST 15 - 41 U/L - - -  ALT 0 - 44 U/L - - -    Recent Labs    05/27/21 1203 05/27/21 1732 05/27/21 2057  GLUCAP 127* 157* 139*      Pertinent Imaging: No results found.  ASSESSMENT/PLAN:  Assessment: Principal Problem:   Acute CVA (cerebrovascular accident) ([place]) Active Problems:   TIA (transient ischemic attack)  [name] [name] is a 75 y.o. with a pertinent PMH of A. Fib on Eliquis, HFrEF (EF 25% 05/26/21)  2/2  dilated cardiomyopathy, HTN, T2DM, and dementia who presents to the ED with c/o left leg weakness and admitted for acute lacunar CVA on hospital day 2.   Plan: #Acute lacunar infarct secondary to small vessel disease MRA head and neck with no evidence of large vessel occlusion.  Echo negative for thrombus.  Given location and size, likely secondary to small vessel disease. PT/OT recommending SNF; patient's wife is in agreement.  Patient has underlying dementia with superimposed delirium at this time.  He was evaluated by inpatient rehab and deemed not to be able to participate in physical therapy.  We will follow-up with transition of care for SNF placement.  -Patient rehab unable to evaluate patient today due to agitation.  Will touch base with TOC for SNF placement. -SNF placement -Neurology following; appreciate their recommendations -Home medications include metoprolol, entresto, and spironolactone.  Restarted Metoprolol 25 mg today, will monitor vitals to determine if increase to home dosage is appropriate. -High intensity statin, atorvastatin 80 mg daily -Continue working on risk management(HTN, T2DM)  #Dementia#Delirium due to hospitalization Given patient's development of visual hallucinations over the last 6 months in addition to fluctuating cognition with impaired short and long-term memory, high suspicion for Lewy body dementia. No cogwheel rigidity on exam.  Patient has become increasingly agitated throughout hospital course.  No sitter available today to help with reorientation.  Resident paged due to patient being aggressive towards staff members.  He was given Seroquel 25 mg and Ativan 0.5 mg.   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  LV was mildly dilated.  Patient did have some evidence of mitral regurgitation, however only moderate.  Recent left heart  cath done at the [facility] hospital demonstrated nonobstructive CAD.  Etiology of cardiomyopathy undetermined at this time.  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  Current guideline directed medical therapy includes metoprolol, Entresto, and spironolactone.  Metoprolol 25 mg was restarted yesterday.  His heart rate has been variable ranging from 42-100 throughout today.  We will continue same dosage of metoprolol.  - Monitor volume status - restarted metoprolol at 25 mg today  # Hypokalemia  K of 3.4 this morning. Etiology undetermined at this time, especially given patient is only on potassium sparing diuretic and Entresto.   - Monitor daily and replete as needed  Best Practice: Diet: Cardiac diet IVF: Fluids: none VTE:  Code: Full AB: none Therapy Recs: SNF Family Contact: updated wife via  phone, will give her another update on SNF placement status tomorrow DISPO: Anticipated discharge to Skilled nursing facility pending  skilled nursing facility placement .  Signature: Christiana Fuchs, D.O. Internal Medicine Resident, PGY-1 [facility]  Pager: [phone] 9:20 AM, 05/28/2021   Please contact the on call pager after 5 pm and on weekends at [phone].

## 2021-05-28 NOTE — Progress Notes (Signed)
Inpatient Rehabilitation Admissions Coordinator   Met at bedside with patient, nurse and sitter yesterday. Patient's current cognition level is a barrier to his ability to fully participate in an intensive CIR admit. I will follow his case at a distance . Discussed with SW.  Danne Baxter, RN, MSN Rehab Admissions Coordinator [phone] 05/28/2021 8:34 AM

## 2021-05-29 DIAGNOSIS — I639 Cerebral infarction, unspecified: Secondary | ICD-10-CM | POA: Diagnosis not present

## 2021-05-29 LAB — CBC
HCT: 38.4 % — ABNORMAL LOW (ref 39.0–52.0)
Hemoglobin: 13.5 g/dL (ref 13.0–17.0)
MCH: 34 pg (ref 26.0–34.0)
MCHC: 35.2 g/dL (ref 30.0–36.0)
MCV: 96.7 fL (ref 80.0–100.0)
Platelets: 211 10*3/uL (ref 150–400)
RBC: 3.97 MIL/uL — ABNORMAL LOW (ref 4.22–5.81)
RDW: 13 % (ref 11.5–15.5)
WBC: 13.9 10*3/uL — ABNORMAL HIGH (ref 4.0–10.5)
nRBC: 0 % (ref 0.0–0.2)

## 2021-05-29 LAB — BASIC METABOLIC PANEL
Anion gap: 8 (ref 5–15)
BUN: 13 mg/dL (ref 8–23)
CO2: 24 mmol/L (ref 22–32)
Calcium: 9.7 mg/dL (ref 8.9–10.3)
Chloride: 106 mmol/L (ref 98–111)
Creatinine, Ser: 1.75 mg/dL — ABNORMAL HIGH (ref 0.61–1.24)
GFR, Estimated: 40 mL/min — ABNORMAL LOW (ref >60)
Glucose, Bld: 119 mg/dL — ABNORMAL HIGH (ref 70–99)
Potassium: 3.8 mmol/L (ref 3.5–5.1)
Sodium: 138 mmol/L (ref 135–145)

## 2021-05-29 LAB — GLUCOSE, CAPILLARY
Glucose-Capillary: 104 mg/dL — ABNORMAL HIGH (ref 70–99)
Glucose-Capillary: 196 mg/dL — ABNORMAL HIGH (ref 70–99)
Glucose-Capillary: 76 mg/dL (ref 70–99)

## 2021-05-29 LAB — MAGNESIUM: Magnesium: 1.9 mg/dL (ref 1.7–2.4)

## 2021-05-29 MED ORDER — POTASSIUM CHLORIDE 20 MEQ PO PACK
20.0000 meq | PACK | Freq: Once | ORAL | Status: AC
  Administered 2021-05-29: 20 meq via ORAL
  Filled 2021-05-29: qty 1

## 2021-05-29 MED ORDER — MAGNESIUM SULFATE 2 GM/50ML IV SOLN
2.0000 g | Freq: Once | INTRAVENOUS | Status: AC
  Administered 2021-05-29: 2 g via INTRAVENOUS
  Filled 2021-05-29: qty 50

## 2021-05-29 MED ORDER — QUETIAPINE FUMARATE 25 MG PO TABS
25.0000 mg | ORAL_TABLET | Freq: Every day | ORAL | Status: DC
  Administered 2021-05-29 – 2021-06-10 (×12): 25 mg via ORAL
  Filled 2021-05-29 (×13): qty 1

## 2021-05-29 NOTE — Progress Notes (Signed)
Inpatient Rehabilitation Admissions Coordinator   We will sign off at this time.  Danne Baxter, RN, MSN Rehab Admissions Coordinator [phone] 05/29/2021 12:02 PM

## 2021-05-29 NOTE — Progress Notes (Signed)
Occupational Therapy Treatment Patient Details Name: [name] [name] MRN: [id number] DOB: [birth year] Today's Date: 05/29/2021   History of present illness [name] is a 75 y/o male with a PMHx of A. 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 [name] has dementia with short-term memory deficits at baseline.  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   OT comments  Pt making incremental progress with OT goals this session. He required multimodal cueing  and +2 for safety for all OOB activities and transfers/ADL's. Pt ataxic with attempts to take steps forward. He presents with increased confusion and impulsivity this session. OT will continue to follow acutely.    Recommendations for follow up therapy are one component of a multi-disciplinary discharge planning process, led by the attending physician.  Recommendations may be updated based on patient status, additional functional criteria and insurance authorization.    Follow Up Recommendations  SNF;Supervision/Assistance - 24 hour    Equipment Recommendations  None recommended by OT    Recommendations for Other Services      Precautions / Restrictions Precautions Precautions: Fall Precaution Comments: decreased L awareness, likely Lewy Body dementia Restrictions Weight Bearing Restrictions: No       Mobility Bed Mobility Overal bed mobility: Needs Assistance Bed Mobility: Supine to Sit;Sit to Supine     Supine to sit: Min assist;+2 for safety/equipment;HOB elevated Sit to supine: Min assist;+2 for safety/equipment   General bed mobility comments: pt impulsive to initiate but needs steadying assist and heavy safety cues; noted posey belt in bed, needed to be reapplied at end of session for pt safety    Transfers Overall transfer level: Needs assistance Equipment used: Rolling walker (2  wheeled) Transfers: Sit to/from Stand;Lateral/Scoot Transfers Sit to Stand: Mod assist;+2 safety/equipment;+2 physical assistance;From elevated surface        Lateral/Scoot Transfers: Mod assist;+2 safety/equipment General transfer comment: initated on his own, but needed assist to complete getting up and anterior weight shift, pt with poor hand placement and L awareness needing hand over hand assist for steadying grip toward RW and tcs for upright posture; x4 trials; also performed seated scoots toward HOB with max multimodal cues, increased time and min/modA    Balance Overall balance assessment: Needs assistance Sitting-balance support: Feet supported Sitting balance-Leahy Scale: Fair Sitting balance - Comments: pt has righting reactions but impulsive and needing min guard to minA for safety at all times Postural control: Posterior lean;Left lateral lean Standing balance support: Bilateral upper extremity supported;During functional activity Standing balance-Leahy Scale: Poor Standing balance comment: min to modA with UE support for static balance, dynamic balance with RW and mod to maxA +2, increased difficulty with backward steps                           ADL either performed or assessed with clinical judgement   ADL Overall ADL's : Needs assistance/impaired     Grooming: Set up;Wash/dry hands;Wash/dry face;Sitting Grooming Details (indicate cue type and reason): sitting EOB                 Toilet Transfer: Moderate assistance;+2 for physical assistance;+2 for safety/equipment;Stand-pivot Toilet Transfer Details (indicate cue type and reason): simulated to chair and back         Functional mobility during ADLs: Moderate assistance;+2 for physical assistance;+2 for safety/equipment General ADL Comments: Pt working on OOB  mobility this session, as well as leaning out of BoS     Vision       Perception     Praxis      Cognition Arousal/Alertness:  Awake/alert Behavior During Therapy: Restless;Impulsive Overall Cognitive Status: Impaired/Different from baseline Area of Impairment: Orientation;Memory;Safety/judgement;Problem solving;Following commands;Attention;Awareness                 Orientation Level: Disoriented to;Time;Situation;Place Current Attention Level: Focused Memory: Decreased short-term memory Following Commands: Follows one step commands with increased time Safety/Judgement: Decreased awareness of safety;Decreased awareness of deficits Awareness: Intellectual Problem Solving: Slow processing;Difficulty sequencing;Requires verbal cues;Requires tactile cues General Comments: Pt difficult to understand and initially lethargic but more alert once seated, pt following ~50% of 1-step mobility commands, impulsive to initiate movement but with ataxia/motor deficits so unsafe to progress gait to bathroom, RN notified to limit mobility to pivotal transfers for BSC/chair with +2 staff for safety.        Exercises Exercises: Other exercises Other Exercises Other Exercises: Punches forward Other Exercises: Shoulder shrugs Other Exercises: Reaching to the sides   Shoulder Instructions       General Comments      Pertinent Vitals/ Pain       Pain Assessment: No/denies pain  Home Living                                          Prior Functioning/Environment              Frequency  Min 2X/week        Progress Toward Goals  OT Goals(current goals can now be found in the care plan section)  Progress towards OT goals: Progressing toward goals  Acute Rehab OT Goals Patient Stated Goal: to sit up OT Goal Formulation: With patient Potential to Achieve Goals: Fair ADL Goals Pt Will Perform Grooming: with min guard assist;standing Pt Will Perform Lower Body Bathing: with min guard assist;sit to/from stand Pt Will Perform Lower Body Dressing: with min guard assist;sit to/from stand Pt  Will Transfer to Toilet: with min guard assist;ambulating Pt Will Perform Toileting - Clothing Manipulation and hygiene: with min guard assist;sitting/lateral leans Pt Will Perform Tub/Shower Transfer: with min guard assist;ambulating;tub bench  Plan Discharge plan remains appropriate;Frequency remains appropriate    Co-evaluation    PT/OT/SLP Co-Evaluation/Treatment: Yes Reason for Co-Treatment: Complexity of the patient's impairments (multi-system involvement);Necessary to address cognition/behavior during functional activity;For patient/therapist safety;To address functional/ADL transfers PT goals addressed during session: Mobility/safety with mobility;Balance;Proper use of DME;Strengthening/ROM OT goals addressed during session: ADL's and self-care;Strengthening/ROM      AM-PAC OT "6 Clicks" Daily Activity     Outcome Measure   Help from another person eating meals?: A Little Help from another person taking care of personal grooming?: A Little Help from another person toileting, which includes using toliet, bedpan, or urinal?: A Lot Help from another person bathing (including washing, rinsing, drying)?: A Lot Help from another person to put on and taking off regular upper body clothing?: A Little Help from another person to put on and taking off regular lower body clothing?: A Lot 6 Click Score: 15    End of Session Equipment Utilized During Treatment: Gait belt  OT Visit Diagnosis: Unsteadiness on feet (R26.81);Other abnormalities of gait and mobility (R26.89);Muscle weakness (generalized) (M62.81);Repeated falls (R29.6);History of falling (Z91.81);Pain   Activity Tolerance Patient tolerated treatment well  Patient Left in chair;with call bell/phone within reach;with chair alarm set   Nurse Communication Mobility status        Time: 6553-7482 OT Time Calculation (min): 34 min  Charges: OT General Charges $OT Visit: 1 Visit OT Treatments $Therapeutic Activity: 8-22  mins  [name] [name] H., OTR/L Acute Rehabilitation  [name] [name] 05/29/2021, 8:30 PM

## 2021-05-29 NOTE — Plan of Care (Signed)

## 2021-05-29 NOTE — Progress Notes (Signed)
HD#2 SUBJECTIVE:  Patient Summary: [name] [name] is a 75 y.o. with a pertinent PMH of A. Fib on [name], [name] (EF 25% 05/26/21)  2/2 dilated [name], [name] [name], [name] [name], [name] dementia who presents to the ED with c/o left leg weakness [name] admitted for acute lacunar CVA on hospital day 2.   Overnight Events:   Patient continues to require Ativan due to restlessness  Interim History:   [name] [name] is sleepy on examination. He did eat nearly all his breakfast this AM in addition to his juice [name] milk.   OBJECTIVE:  Vital Signs: Vitals:   05/28/21 1639 05/28/21 2016 05/29/21 0030 05/29/21 0353  BP: (!) 133/105 (!) 126/96 (!) 144/91 (!) 132/97  Pulse: 100 62 63 (!) 113  Resp: 17 (!) 24 20 19   Temp: (!) 97.5 F (36.4 C) 98.7 F (37.1 C) 97.9 F (36.6 C) 97.7 F (36.5 C)  TempSrc: Oral Axillary Axillary Axillary  SpO2: 96% 96% 99% 100%   Supplemental O2: Room Air SpO2: 100 %  There were no vitals filed for this visit.   Intake/Output Summary (Last 24 hours) at 05/29/2021 0551 Last data filed at 05/28/2021 2023 Gross per 24 hour  Intake --  Output 450 ml  Net -450 ml    Net IO Since Admission: -302 mL [05/29/21 0551]  Physical Exam: General: Elderly man, in no acute distress, restless, somnolent HENT: NCAT Eyes: no scleral icterus, conjunctiva clear CV: irregularly irregular with both brady [name] tachycardia, 1+ radial [name] DP pulses Pulm: CTAB, normal pulmonary effort GI: Abdomen is soft, non distended MSK: No edema in lower extremities bilaterally, normal muscle tone Skin: Cool [name] clammy extremities with poor skin turgor, no rashes noted Neuro: Sedated with belt restraint  Patient Lines/Drains/Airways Status     Active Line/Drains/Airways     Name Placement date Placement time Site Days   Peripheral IV 03/01/20 Anterior;Left;Proximal Forearm 03/01/20  1430  Forearm  452   Peripheral IV 05/25/21 20 G Left Antecubital 05/25/21  1226  Antecubital  2   External Urinary  Catheter 05/26/21  1342  --  1   Incision (Closed) 08/04/17 Knee Right 08/04/17  1354  -- 1392            Pertinent Labs: CBC Latest Ref Rng & Units 05/29/2021 05/28/2021 05/25/2021  WBC 4.0 - 10.5 K/uL 13.9(H) 13.1(H) -  Hemoglobin 13.0 - 17.0 g/dL 13.5 12.7(L) 12.6(L)  Hematocrit 39.0 - 52.0 % 38.4(L) 35.7(L) 37.0(L)  Platelets 150 - 400 K/uL 211 217 -    CMP Latest Ref Rng & Units 05/29/2021 05/28/2021 05/27/2021  Glucose 70 - 99 mg/dL 119(H) 122(H) 159(H)  BUN 8 - 23 mg/dL 13 12 11   Creatinine 0.61 - 1.24 mg/dL 1.75(H) 1.62(H) 1.51(H)  Sodium 135 - 145 mmol/L 138 137 137  Potassium 3.5 - 5.1 mmol/L 3.8 3.4(L) 3.2(L)  Chloride 98 - 111 mmol/L 106 103 103  CO2 22 - 32 mmol/L 24 22 23   Calcium 8.9 - 10.3 mg/dL 9.7 9.6 9.3  Total Protein 6.5 - 8.1 g/dL - - -  Total Bilirubin 0.3 - 1.2 mg/dL - - -  Alkaline Phos 38 - 126 U/L - - -  AST 15 - 41 U/L - - -  ALT 0 - 44 U/L - - -    Recent Labs    05/28/21 1236 05/28/21 1642 05/28/21 2209  GLUCAP 130* 149* 118*      Pertinent Imaging: No results found.  ASSESSMENT/PLAN:  Assessment: Principal Problem:  Acute CVA (cerebrovascular accident) [name]:   TIA (transient ischemic attack)  [name] is a 75 y.o. with a pertinent PMH of A. Fib on [name], [name] (EF 25% 05/26/21)  2/2 dilated [name], [name] [name], [name] [name], [name] dementia who presents to the ED with c/o left leg weakness [name] admitted for acute lacunar CVA on hospital day 2.   Plan: #Acute lacunar infarct secondary to small vessel disease MRA head [name] neck with no evidence of large vessel occlusion.  Echo negative for thrombus.  Given location [name] size, likely secondary to small vessel disease. PT/OT recommending SNF; patient's wife is in agreement.  Patient has underlying dementia with superimposed delirium at this time.  He was evaluated by inpatient rehab [name] deemed not to be able to participate in physical therapy.  We will follow-up with  transition of care for SNF placement.   -SNF placement -Home medications include metoprolol, entresto, [name] spironolactone.  Restarted Metoprolol 25 mg , hr have been in 60s. -High intensity statin, atorvastatin 80 mg daily -Continue working on risk management ([name] [name], [name] [name])  #Dementia#Delirium due to hospitalization Given patient's development of visual hallucinations over the last 6 months in addition to fluctuating cognition with impaired short [name] long-term memory, high suspicion for Lewy body dementia. No cogwheel rigidity on exam.  Patient has become increasingly agitated throughout hospital course.  No sitter available today to help with reorientation.  Resident paged due to patient being aggressive towards staff members.  He was given Seroquel 25 mg [name] Ativan 0.5 mg.   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  LV was mildly dilated.  Patient did have some evidence of mitral regurgitation, however only moderate.  We started metoprolol 25 mg [name] heart rates have been in the 60s.  He appears hypovolemic with decreased skin turgor.    - Monitor volume status - restarted metoprolol at 25 mg  # Hypokalemia  K of 3.8 this morning. Etiology undetermined at this time, especially given patient is only on potassium sparing diuretic [name] Entresto.   - Monitor daily [name] replete as needed  Best Practice: Diet: Cardiac diet IVF: Fluids: none VTE:  Code: Full AB: none Therapy Recs: SNF Family Contact: updated wife via phone, will give her another update on SNF placement status tomorrow DISPO: Anticipated discharge to Skilled nursing facility pending  skilled nursing facility placement .  Signature: Christiana Fuchs, D.O. Internal Medicine Resident, PGY-1 [facility]  Pager: [phone] 5:51 AM, 05/29/2021   Please contact the on call pager  after 5 pm [name] on weekends at [phone].

## 2021-05-29 NOTE — Progress Notes (Signed)
Physical Therapy Treatment Patient Details Name: [name] [name] MRN: [id number] DOB: 06-08-1946 Today's Date: 05/29/2021   History of Present Illness [name] is a 75 y/o male with a PMHx of A. 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 [name] [name] has dementia with short-term memory deficits at baseline.  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    PT Comments    Pt received in supine, drowsy but participatory as able, pt more confused this date and impulsive, needing heavy multimodal cues and +2 for safety with all seated/standing tasks. Pt with significant ataxia when attempting standing activity and unsafe use of RW, limited to short gait trial at bedside. Recommend nursing staff perform stand pivot to chair/BSC with RW or Stedy for pt/staff safety. Pt falling asleep due to fatigue at end of session once back in bed. Pt continues to benefit from PT services to progress toward functional mobility goals.   Recommendations for follow up therapy are one component of a multi-disciplinary discharge planning process, led by the attending physician.  Recommendations may be updated based on patient status, additional functional criteria and insurance authorization.  Follow Up Recommendations  SNF     Equipment Recommendations  None recommended by PT    Recommendations for Other Services       Precautions / Restrictions Precautions Precautions: Fall Precaution Comments: decreased L awareness, likely Lewy Body dementia Restrictions Weight Bearing Restrictions: No     Mobility  Bed Mobility Overal bed mobility: Needs Assistance Bed Mobility: Supine to Sit;Sit to Supine     Supine to sit: Min assist;+2 for safety/equipment;HOB elevated Sit to supine: Min assist;+2 for safety/equipment   General bed mobility comments: pt impulsive to initiate but needs steadying  assist and heavy safety cues; noted posey belt in bed, needed to be reapplied at end of session for pt safety    Transfers Overall transfer level: Needs assistance Equipment used: Rolling walker (2 wheeled) Transfers: Sit to/from Stand;Lateral/Scoot Transfers Sit to Stand: Mod assist;+2 safety/equipment;+2 physical assistance;From elevated surface        Lateral/Scoot Transfers: Mod assist;+2 safety/equipment General transfer comment: initated on his own, but needed assist to complete getting up and anterior weight shift, pt with poor hand placement and L awareness needing hand over hand assist for steadying grip toward RW and tcs for upright posture; x4 trials; also performed seated scoots toward HOB with max multimodal cues, increased time and min/modA  Ambulation/Gait Ambulation/Gait assistance: Max assist;Mod assist;+2 physical assistance;+2 safety/equipment Gait Distance (Feet): 8 Feet Assistive device: Rolling walker (2 wheeled) Gait Pattern/deviations: Step-through pattern;Decreased stride length;Narrow base of support;Ataxic     General Gait Details: L foot ataxia, needing heavy assist to prevent L lateral LOB and to keep walker in good position for assist for balance, pt with increased difficulty during backward steps needs verbal and tactile cues and ultimately moved bed closer to pt due to multiple posterior LOB and impulsivity to sit.   Modified Rankin (Stroke Patients Only) Modified Rankin (Stroke Patients Only) Pre-Morbid Rankin Score: No significant disability Modified Rankin: Moderately severe disability     Balance Overall balance assessment: Needs assistance Sitting-balance support: Feet supported Sitting balance-Leahy Scale: Fair Sitting balance - Comments: pt has righting reactions but impulsive and needing min guard to minA for safety at all times Postural control: Posterior lean;Left lateral lean Standing balance support: Bilateral upper extremity  supported;During functional activity Standing  balance-Leahy Scale: Poor Standing balance comment: min to modA with UE support for static balance, dynamic balance with RW and mod to maxA +2, increased difficulty with backward steps         Cognition Arousal/Alertness: Awake/alert Behavior During Therapy: Restless;Impulsive Overall Cognitive Status: Impaired/Different from baseline Area of Impairment: Orientation;Memory;Safety/judgement;Problem solving;Following commands;Attention;Awareness                 Orientation Level: Disoriented to;Time;Situation;Place Current Attention Level: Focused Memory: Decreased short-term memory Following Commands: Follows one step commands with increased time Safety/Judgement: Decreased awareness of safety;Decreased awareness of deficits Awareness: Intellectual Problem Solving: Slow processing;Difficulty sequencing;Requires verbal cues;Requires tactile cues General Comments: Pt difficult to understand and initially lethargic but more alert once seated, pt following ~50% of 1-step mobility commands, impulsive to initiate movement but with ataxia/motor deficits so unsafe to progress gait to bathroom, RN notified to limit mobility to pivotal transfers for BSC/chair with +2 staff for safety.      Exercises Other Exercises Other Exercises: seated BLE AAROM: hip/knee/ankle flex/ext x 10 reps ea Other Exercises: seated BUE reaching and pushing/pulling with gentle graded resistance within tolerance x10 reps ea Other Exercises: STS x 3 reps for strengthening      PT Goals (current goals can now be found in the care plan section) Acute Rehab PT Goals Patient Stated Goal: to sit up PT Goal Formulation: Patient unable to participate in goal setting Time For Goal Achievement: 06/09/21 Potential to Achieve Goals: Fair Progress towards PT goals: Progressing toward goals    Frequency    Min 3X/week      PT Plan Current plan remains appropriate     Co-evaluation PT/OT/SLP Co-Evaluation/Treatment: Yes Reason for Co-Treatment: Complexity of the patient's impairments (multi-system involvement);Necessary to address cognition/behavior during functional activity;For patient/therapist safety;To address functional/ADL transfers PT goals addressed during session: Mobility/safety with mobility;Balance;Proper use of DME;Strengthening/ROM        AM-PAC PT "6 Clicks" Mobility   Outcome Measure  Help needed turning from your back to your side while in a flat bed without using bedrails?: A Little Help needed moving from lying on your back to sitting on the side of a flat bed without using bedrails?: A Little Help needed moving to and from a bed to a chair (including a wheelchair)?: A Lot Help needed standing up from a chair using your arms (e.g., wheelchair or bedside chair)?: A Lot Help needed to walk in hospital room?: Total Help needed climbing 3-5 steps with a railing? : Total 6 Click Score: 12    End of Session Equipment Utilized During Treatment: Gait belt Activity Tolerance: Patient tolerated treatment well Patient left: with call bell/phone within reach;with bed alarm set;in bed;Other (comment) (bed in chair position per pt request) Nurse Communication: Mobility status;Other (comment) (unsafe for gait to bathroom, try stand pivot to [facility] for toileting) PT Visit Diagnosis: Other abnormalities of gait and mobility (R26.89);Hemiplegia and hemiparesis;Muscle weakness (generalized) (M62.81) Hemiplegia - Right/Left: Left Hemiplegia - dominant/non-dominant: Non-dominant Hemiplegia - caused by: Cerebral infarction     Time: 3382-5053 PT Time Calculation (min) (ACUTE ONLY): 32 min  Charges:  $Therapeutic Exercise: 8-22 mins                     [name] [name] P., PTA Acute Rehabilitation Services Pager: [phone] Office: [place] 05/29/2021, 6:43 PM

## 2021-05-30 DIAGNOSIS — I639 Cerebral infarction, unspecified: Secondary | ICD-10-CM | POA: Diagnosis not present

## 2021-05-30 LAB — CBC
HCT: 40.9 % (ref 39.0–52.0)
Hemoglobin: 14.1 g/dL (ref 13.0–17.0)
MCH: 33.8 pg (ref 26.0–34.0)
MCHC: 34.5 g/dL (ref 30.0–36.0)
MCV: 98.1 fL (ref 80.0–100.0)
Platelets: 195 10*3/uL (ref 150–400)
RBC: 4.17 MIL/uL — ABNORMAL LOW (ref 4.22–5.81)
RDW: 13.1 % (ref 11.5–15.5)
WBC: 13.5 10*3/uL — ABNORMAL HIGH (ref 4.0–10.5)
nRBC: 0 % (ref 0.0–0.2)

## 2021-05-30 LAB — BASIC METABOLIC PANEL
Anion gap: 11 (ref 5–15)
BUN: 18 mg/dL (ref 8–23)
CO2: 21 mmol/L — ABNORMAL LOW (ref 22–32)
Calcium: 9.6 mg/dL (ref 8.9–10.3)
Chloride: 105 mmol/L (ref 98–111)
Creatinine, Ser: 1.73 mg/dL — ABNORMAL HIGH (ref 0.61–1.24)
GFR, Estimated: 40 mL/min — ABNORMAL LOW (ref >60)
Glucose, Bld: 122 mg/dL — ABNORMAL HIGH (ref 70–99)
Potassium: 3.5 mmol/L (ref 3.5–5.1)
Sodium: 137 mmol/L (ref 135–145)

## 2021-05-30 LAB — GLUCOSE, CAPILLARY
Glucose-Capillary: 118 mg/dL — ABNORMAL HIGH (ref 70–99)
Glucose-Capillary: 142 mg/dL — ABNORMAL HIGH (ref 70–99)
Glucose-Capillary: 156 mg/dL — ABNORMAL HIGH (ref 70–99)
Glucose-Capillary: 52 mg/dL — ABNORMAL LOW (ref 70–99)
Glucose-Capillary: 53 mg/dL — ABNORMAL LOW (ref 70–99)
Glucose-Capillary: 78 mg/dL (ref 70–99)

## 2021-05-30 NOTE — TOC Progression Note (Addendum)
Transition of Care [facility]) - Progression Note    Patient Details  Name: [name] [name] MRN: [id number] Date of Birth: 09/27/1945  Transition of Care [facility]) CM/SW [place], [place] Phone Number: 05/30/2021, 3:40 PM  Clinical Narrative:    CSW provided bed offers to pt's spouse. She requested some time to make a decision and will follow back up with choice. TOC will continue to follow.  Spouse chose [facility], [facility] will need to follow up with Facility on Monday for bed availability. Expected Discharge Plan: [place] Barriers to Discharge: Continued Medical Work up, Requiring sitter/restraints  Expected Discharge Plan and Services Expected Discharge Plan: [place] Choice: [place] arrangements for the past 2 months: Single Family Home                                       Social Determinants of Health (SDOH) Interventions    Readmission Risk Interventions No flowsheet data found.

## 2021-05-30 NOTE — Progress Notes (Signed)
HD#2 SUBJECTIVE:  Patient Summary: [name] [name] is a 75 y.o. with a pertinent PMH of A. Fib on Eliquis, HFrEF (EF 25% 05/26/21)  2/2 dilated cardiomyopathy, HTN, T2DM, and dementia who presents to the ED with c/o left leg weakness and admitted for acute lacunar CVA on hospital day 2.   Overnight Events:  No overnight events, patient did not require Ativan.  Interim History:  Patient seen at bedside this AM.  He was somnolent and resting with eyes closed with mittens in place in Posey belt in place.  His wife and brother-in-law were at bedside.  OBJECTIVE:  Vital Signs: Vitals:   05/29/21 1630 05/29/21 2016 05/30/21 0001 05/30/21 0545  BP: 121/85 (!) 123/98 107/73 (!) 142/97  Pulse: 65 65 60 93  Resp: 17 20 (!) 23 (!) 24  Temp: 97.8 F (36.6 C) 98.8 F (37.1 C) 98.4 F (36.9 C) 97.9 F (36.6 C)  TempSrc: Axillary Oral Oral Axillary  SpO2: 100% 99%     Supplemental O2: Room Air SpO2: 99 %  There were no vitals filed for this visit.   Intake/Output Summary (Last 24 hours) at 05/30/2021 0602 Last data filed at 05/29/2021 1747 Gross per 24 hour  Intake 520 ml  Output 200 ml  Net 320 ml    Net IO Since Admission: 18 mL [05/30/21 0602]  Physical Exam: General: Elderly man, in no acute distress, resting with eyes closed with mittens and Posey belt in place HENT: NCAT Eyes: no scleral icterus, conjunctiva clear CV: irregularly irregular with both brady and tachycardia, 1+ radial and DP pulses Pulm: CTAB, normal pulmonary effort GI: Abdomen is soft, non distended MSK: No edema in lower extremities bilaterally, normal muscle tone Skin: Skin turgor normal  Patient Lines/Drains/Airways Status     Active Line/Drains/Airways     Name Placement date Placement time Site Days   Peripheral IV 03/01/20 Anterior;Left;Proximal Forearm 03/01/20  1430  Forearm  452   Peripheral IV 05/25/21 20 G Left Antecubital 05/25/21  1226  Antecubital  2   External Urinary Catheter 05/26/21   1342  --  1   Incision (Closed) 08/04/17 Knee Right 08/04/17  1354  -- 1392            Pertinent Labs: CBC Latest Ref Rng & Units 05/30/2021 05/29/2021 05/28/2021  WBC 4.0 - 10.5 K/uL 13.5(H) 13.9(H) 13.1(H)  Hemoglobin 13.0 - 17.0 g/dL 14.1 13.5 12.7(L)  Hematocrit 39.0 - 52.0 % 40.9 38.4(L) 35.7(L)  Platelets 150 - 400 K/uL 195 211 217    CMP Latest Ref Rng & Units 05/29/2021 05/28/2021 05/27/2021  Glucose 70 - 99 mg/dL 119(H) 122(H) 159(H)  BUN 8 - 23 mg/dL 13 12 11   Creatinine 0.61 - 1.24 mg/dL 1.75(H) 1.62(H) 1.51(H)  Sodium 135 - 145 mmol/L 138 137 137  Potassium 3.5 - 5.1 mmol/L 3.8 3.4(L) 3.2(L)  Chloride 98 - 111 mmol/L 106 103 103  CO2 22 - 32 mmol/L 24 22 23   Calcium 8.9 - 10.3 mg/dL 9.7 9.6 9.3  Total Protein 6.5 - 8.1 g/dL - - -  Total Bilirubin 0.3 - 1.2 mg/dL - - -  Alkaline Phos 38 - 126 U/L - - -  AST 15 - 41 U/L - - -  ALT 0 - 44 U/L - - -    Recent Labs    05/29/21 0623 05/29/21 1149 05/29/21 1715  GLUCAP 104* 196* 76      Pertinent Imaging: No results found.  ASSESSMENT/PLAN:  Assessment: Principal Problem:  Acute CVA (cerebrovascular accident) [facility]) Active Problems:   TIA (transient ischemic attack)  [name] [name] is a 75 y.o. with a pertinent PMH of A. Fib on Eliquis, HFrEF (EF 25% 05/26/21)  2/2 dilated cardiomyopathy, HTN, T2DM, and dementia who presents to the ED with c/o left leg weakness and admitted for acute lacunar CVA on hospital day 2.   Plan: #Acute lacunar infarct secondary to small vessel disease Left lower leg weakness from stroke has largely resolved. Restarted metoprolol 25 mg and HR have been in 70-90s.   -SNF placement -Home medications include metoprolol, entresto, and spironolactone.  Restarted Metoprolol 25 mg -High intensity statin, atorvastatin 80 mg daily -Continue working on risk management (HTN, T2DM)  #Dementia#Delirium due to hospitalization No ativan given overnight.  Patient resting with mittens and  posey belt in place.  - Seroquel 25 mg qhs -- Remeron and rivastigmine qHS   #Leukocytosis Blood cell count from 13.9-13.5 today.  No urinalysis or urine culture completed on admission.  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 Started metoprolol 25 mg and heart rates have been in the 60s.  Home medications include metoprolol, entresto, and spironolactone.  Holding Entresto and spironolactone in the setting of decreased p.o. intake.  Patient appears hypovolemic on exam.  - Monitor volume status - Started metoprolol at 25 mg  # Hypokalemia  K of 3.8 this morning. Etiology undetermined at this time, especially given patient is only on potassium sparing diuretic and Entresto.   - Monitor daily and replete as needed  Best Practice: Diet: Cardiac diet IVF: Fluids: none VTE:  Code: Full AB: none Therapy Recs: SNF Family Contact: updated wife via phone, will give her another update on SNF placement status tomorrow DISPO: Anticipated discharge to Skilled nursing facility pending  skilled nursing facility placement .  Signature: Christiana Fuchs, D.O. Internal Medicine Resident, PGY-1 [facility]  Pager: [phone] 6:02 AM, 05/30/2021   Please contact the on call pager after 5 pm and on weekends at [phone].

## 2021-05-31 DIAGNOSIS — I639 Cerebral infarction, unspecified: Secondary | ICD-10-CM | POA: Diagnosis not present

## 2021-05-31 LAB — GLUCOSE, CAPILLARY
Glucose-Capillary: 98 mg/dL (ref 70–99)
Glucose-Capillary: 176 mg/dL — ABNORMAL HIGH (ref 70–99)
Glucose-Capillary: 117 mg/dL — ABNORMAL HIGH (ref 70–99)
Glucose-Capillary: 153 mg/dL — ABNORMAL HIGH (ref 70–99)

## 2021-05-31 MED ORDER — POTASSIUM CHLORIDE 10 MEQ/100ML IV SOLN: 10.0000 meq | INTRAVENOUS | Status: DC

## 2021-05-31 MED ORDER — POTASSIUM CHLORIDE 20 MEQ PO PACK
40.0000 meq | PACK | Freq: Once | ORAL | Status: AC
  Administered 2021-05-31: 40 meq via ORAL
  Filled 2021-05-31: qty 2

## 2021-05-31 NOTE — Progress Notes (Signed)
HD#4 Subjective:  Overnight Events: Hypoglycemic   [name] is resting in bed comfortably with mittens and posey belt in place. He denies any new concerns at this time. Denies shortness of breath or cough. Denies abd pain. Foley in place with darker colored urine.   Also per nursing staff, patient's wife noticed patient had shaven face. She was not asked about this, staff uncertain of when this occurred.   Objective:  Vital signs in last 24 hours: Vitals:   05/30/21 2015 05/30/21 2302 05/31/21 0333 05/31/21 0840  BP: 139/78 105/86 126/81 115/71  Pulse: 66 89 64 (!) 55  Resp: 19 18 17 19   Temp: 97.8 F (36.6 C) 98.2 F (36.8 C) 98.9 F (37.2 C) (!) 97.4 F (36.3 C)  TempSrc: Oral Axillary Axillary Oral  SpO2: 97% 99% 100% 98%   Supplemental O2: Room Air SpO2: 98 %   Physical Exam:  Constitutional: Resting comfortably, no acute distress HENT: normocephalic atraumatic, mucous membranes moist Eyes: conjunctiva non-erythematous. No scleral icterus.  Neck: supple Cardiovascular: irregular irregular rhythm. No murmurs, gallops, rubs.  Pulmonary/Chest: normal work of breathing on room air, lungs clear to auscultation bilaterally Abdominal: soft, non-tender, non-distended MSK: normal bulk and tone Neurological: alert & oriented x to place and person, difficult to assess upper extremity strength with mittens in place. 5/5 strenght in RLE. Unable to assess LLE secondary to pain in left hip Skin: warm and dry Psych: drowsy, in/out of sleep.   There were no vitals filed for this visit.   Intake/Output Summary (Last 24 hours) at 05/31/2021 1016 Last data filed at 05/31/2021 0350 Gross per 24 hour  Intake 480 ml  Output 550 ml  Net -70 ml   Net IO Since Admission: 68 mL [05/31/21 1016]  Pertinent Labs: CBC Latest Ref Rng & Units 05/30/2021 05/29/2021 05/28/2021  WBC 4.0 - 10.5 K/uL 13.5(H) 13.9(H) 13.1(H)  Hemoglobin 13.0 - 17.0 g/dL 14.1 13.5 12.7(L)  Hematocrit 39.0  - 52.0 % 40.9 38.4(L) 35.7(L)  Platelets 150 - 400 K/uL 195 211 217    CMP Latest Ref Rng & Units 05/30/2021 05/29/2021 05/28/2021  Glucose 70 - 99 mg/dL 122(H) 119(H) 122(H)  BUN 8 - 23 mg/dL 18 13 12   Creatinine 0.61 - 1.24 mg/dL 1.73(H) 1.75(H) 1.62(H)  Sodium 135 - 145 mmol/L 137 138 137  Potassium 3.5 - 5.1 mmol/L 3.5 3.8 3.4(L)  Chloride 98 - 111 mmol/L 105 106 103  CO2 22 - 32 mmol/L 21(L) 24 22  Calcium 8.9 - 10.3 mg/dL 9.6 9.7 9.6  Total Protein 6.5 - 8.1 g/dL - - -  Total Bilirubin 0.3 - 1.2 mg/dL - - -  Alkaline Phos 38 - 126 U/L - - -  AST 15 - 41 U/L - - -  ALT 0 - 44 U/L - - -    Imaging: No results found.  Assessment/Plan:   Principal Problem:   Acute CVA (cerebrovascular accident) [facility]) Active Problems:   TIA (transient ischemic attack)   Patient Summary: Sasha Rueth is a 75 y.o. with a pertinent PMH of A. Fib on Eliquis, HFrEF (EF 25% 05/26/21)  2/2 dilated cardiomyopathy, HTN, T2DM, and dementia who presents to the ED with c/o left leg weakness and admitted for acute lacunar CVA on hospital day 5.   Acute lacunar infarct secondary to small vessel disease No new neurological deficits on examination today.  Continue medication optimization as per below. -SNF placement -Home medications include metoprolol, entresto, and spironolactone.  -High intensity  statin, atorvastatin 80 mg daily -Continue working on risk management (HTN, T2DM)   Dementia Delirium due to hospitalization Patient has not required Ativan over the last 24 hours.  Doing well with mittens and Posey belt.  We will attempt to slowly remove strands as it is safe per patient. - Seroquel 25 mg qhs -- Remeron and rivastigmine qHS    Leukocytosis Patient with leukocytosis of 13 over the past few days.  He has remained afebrile and without other signs or symptoms of acute infection.  We will continue to monitor as this may explain his worsening encephalopathy. 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  Variable pulses but overall stable, last 96 bpm.  We will continue to monitor and continue metoprolol 25 mg.  Continue to hold spironolactone and Entresto. - Monitor volume status - Continue metoprolol at 25 mg   Hypokalemia, resolved K of 3.5 yesterday morning.  We will give run of IV potassium. -PO K 40 meq -repeat BMP and Mg tomorrow  Diet:  dysphagia 3 IVF: None,None VTE: Eliquis Code: Full PT/OT recs: SNF for Subacute PT,. TOC recs: Pending Placement Family Update: Updated yesterday, family at bedside.   Dispo: Anticipated discharge to Skilled nursing facility pending discontinuation of restraints and need for sedating medications.  Sanjuana Letters DO Internal Medicine Resident PGY-2 Pager [phone] Please contact the on call pager after 5 pm and on weekends at [phone].

## 2021-06-01 ENCOUNTER — Inpatient Hospital Stay (HOSPITAL_COMMUNITY): Payer: No Typology Code available for payment source

## 2021-06-01 DIAGNOSIS — I639 Cerebral infarction, unspecified: Secondary | ICD-10-CM | POA: Diagnosis not present

## 2021-06-01 LAB — BASIC METABOLIC PANEL
Anion gap: 10 (ref 5–15)
BUN: 38 mg/dL — ABNORMAL HIGH (ref 8–23)
CO2: 23 mmol/L (ref 22–32)
Calcium: 9.5 mg/dL (ref 8.9–10.3)
Chloride: 105 mmol/L (ref 98–111)
Creatinine, Ser: 2.13 mg/dL — ABNORMAL HIGH (ref 0.61–1.24)
GFR, Estimated: 31 mL/min — ABNORMAL LOW (ref >60)
Glucose, Bld: 146 mg/dL — ABNORMAL HIGH (ref 70–99)
Potassium: 4.1 mmol/L (ref 3.5–5.1)
Sodium: 138 mmol/L (ref 135–145)

## 2021-06-01 LAB — GLUCOSE, CAPILLARY
Glucose-Capillary: 141 mg/dL — ABNORMAL HIGH (ref 70–99)
Glucose-Capillary: 218 mg/dL — ABNORMAL HIGH (ref 70–99)
Glucose-Capillary: 118 mg/dL — ABNORMAL HIGH (ref 70–99)
Glucose-Capillary: 176 mg/dL — ABNORMAL HIGH (ref 70–99)

## 2021-06-01 LAB — MAGNESIUM: Magnesium: 2 mg/dL (ref 1.7–2.4)

## 2021-06-01 LAB — CBC WITH DIFFERENTIAL/PLATELET
Abs Immature Granulocytes: 0.05 10*3/uL (ref 0.00–0.07)
Basophils Absolute: 0 10*3/uL (ref 0.0–0.1)
Basophils Relative: 0 %
Eosinophils Absolute: 0 10*3/uL (ref 0.0–0.5)
Eosinophils Relative: 0 %
HCT: 39.3 % (ref 39.0–52.0)
Hemoglobin: 13.5 g/dL (ref 13.0–17.0)
Immature Granulocytes: 0 %
Lymphocytes Relative: 23 %
Lymphs Abs: 2.9 10*3/uL (ref 0.7–4.0)
MCH: 33.8 pg (ref 26.0–34.0)
MCHC: 34.4 g/dL (ref 30.0–36.0)
MCV: 98.5 fL (ref 80.0–100.0)
Monocytes Absolute: 0.3 10*3/uL (ref 0.1–1.0)
Monocytes Relative: 2 %
Neutro Abs: 9.4 10*3/uL — ABNORMAL HIGH (ref 1.7–7.7)
Neutrophils Relative %: 75 %
Platelets: 190 10*3/uL (ref 150–400)
RBC: 3.99 MIL/uL — ABNORMAL LOW (ref 4.22–5.81)
RDW: 13.2 % (ref 11.5–15.5)
WBC: 12.6 10*3/uL — ABNORMAL HIGH (ref 4.0–10.5)
nRBC: 0 % (ref 0.0–0.2)

## 2021-06-01 MED ORDER — SODIUM CHLORIDE 0.9 % IV BOLUS
500.0000 mL | Freq: Once | INTRAVENOUS | Status: AC
  Administered 2021-06-01: 500 mL via INTRAVENOUS

## 2021-06-01 NOTE — Discharge Instructions (Addendum)
[name] [name] You were recently admitted to [facility] for a stroke. You have recovered well and you should continue to work with the physical therapists.  If you experience any new one sided weakness, difficulty breathing, facial droop, or difficulty speaking you should seek medical care.   Information on my medicine - ELIQUIS (apixaban)  This medication education was reviewed with me or my healthcare representative as part of my discharge preparation.  The pharmacist that spoke with me during my hospital stay was:    Why was Eliquis prescribed for you? Eliquis was prescribed for you to reduce the risk of a blood clot forming that can cause a stroke if you have a medical condition called atrial fibrillation (a type of irregular heartbeat).  What do You need to know about Eliquis ? Take your Eliquis TWICE DAILY - one tablet in the morning and one tablet in the evening with or without food. If you have difficulty swallowing the tablet whole please discuss with your pharmacist how to take the medication safely.  Take Eliquis exactly as prescribed by your doctor and DO NOT stop taking Eliquis without talking to the doctor who prescribed the medication.  Stopping may increase your risk of developing a stroke.  Refill your prescription before you run out.  After discharge, you should have regular check-up appointments with your healthcare provider that is prescribing your Eliquis.  In the future your dose may need to be changed if your kidney function or weight changes by a significant amount or as you get older.  What do you do if you miss a dose? If you miss a dose, take it as soon as you remember on the same day and resume taking twice daily.  Do not take more than one dose of ELIQUIS at the same time to make up a missed dose.  Important Safety Information A possible side effect of Eliquis is bleeding. You should call your healthcare provider right away if you experience any  of the following: Bleeding from an injury or your nose that does not stop. Unusual colored urine (red or dark [place]) or unusual colored stools (red or black). Unusual bruising for unknown reasons. A serious fall or if you hit your head (even if there is no bleeding).  Some medicines may interact with Eliquis and might increase your risk of bleeding or clotting while on Eliquis. To help avoid this, consult your healthcare provider or pharmacist prior to using any new prescription or non-prescription medications, including herbals, vitamins, non-steroidal anti-inflammatory drugs (NSAIDs) and supplements.  This website has more information on Eliquis (apixaban): http://www.eliquis.com/eliquis/home

## 2021-06-01 NOTE — Progress Notes (Addendum)
HD#5 Subjective:  Overnight Events: NAEON  Patient appeared alert but not oriented. His speech appeared changed with less differentiation of his words. This dysarthria was new to the team. Upon further investigation, nurse tech stated he does this from time to time.    Objective:  Vital signs in last 24 hours: Vitals:   06/01/21 0723 06/01/21 0750 06/01/21 1112 06/01/21 1507  BP: 102/80 129/74 129/83 (!) 138/125  Pulse: (!) 102 (!) 57 90 66  Resp: 14  17 18   Temp: 98.8 F (37.1 C)  98.5 F (36.9 C) 98.5 F (36.9 C)  TempSrc: Oral  Oral Oral  SpO2:   100% 99%   Supplemental O2: Room Air SpO2: 99 %   Physical Exam:  Physical Exam General: NAD Head: Normocephalic without scalp lesions.  Eyes: Conjunctivae pink, sclerae white, without jaundice.  Mouth: Dry mucosa, white patches on tongue.  Neck: Neck supple with full range of motion (ROM).  Lungs: CTAB, limited to anterior auscultation.  Cardiovascular: Normal heart sounds, no r/m/g, 2+ pulses in all extremities Abdomen: No TTP, normal bowel sounds MSK: No asymmetry or muscle atrophy.  Skin: warm, decreased skin turgor, no lesions Neuro: Alert. CN grossly intact Psych: Normal mood and normal affect   There were no vitals filed for this visit.   Intake/Output Summary (Last 24 hours) at 06/01/2021 1937 Last data filed at 06/01/2021 1820 Gross per 24 hour  Intake 580 ml  Output 750 ml  Net -170 ml   Net IO Since Admission: 8 mL [06/01/21 1937]  Pertinent Labs: CBC Latest Ref Rng & Units 06/01/2021 05/30/2021 05/29/2021  WBC 4.0 - 10.5 K/uL 12.6(H) 13.5(H) 13.9(H)  Hemoglobin 13.0 - 17.0 g/dL 13.5 14.1 13.5  Hematocrit 39.0 - 52.0 % 39.3 40.9 38.4(L)  Platelets 150 - 400 K/uL 190 195 211    CMP Latest Ref Rng & Units 06/01/2021 05/30/2021 05/29/2021  Glucose 70 - 99 mg/dL 146(H) 122(H) 119(H)  BUN 8 - 23 mg/dL 38(H) 18 13  Creatinine 0.61 - 1.24 mg/dL 2.13(H) 1.73(H) 1.75(H)  Sodium 135 - 145 mmol/L 138  137 138  Potassium 3.5 - 5.1 mmol/L 4.1 3.5 3.8  Chloride 98 - 111 mmol/L 105 105 106  CO2 22 - 32 mmol/L 23 21(L) 24  Calcium 8.9 - 10.3 mg/dL 9.5 9.6 9.7  Total Protein 6.5 - 8.1 g/dL - - -  Total Bilirubin 0.3 - 1.2 mg/dL - - -  Alkaline Phos 38 - 126 U/L - - -  AST 15 - 41 U/L - - -  ALT 0 - 44 U/L - - -    Imaging: CT HEAD WO CONTRAST (5MM)  Result Date: 06/01/2021 CLINICAL DATA:  Neuro deficit, acute, stroke suspected.  Dysarthria. EXAM: CT HEAD WITHOUT CONTRAST TECHNIQUE: Contiguous axial images were obtained from the base of the skull through the vertex without intravenous contrast. COMPARISON:  CT and MRI studies 05/25/2021 FINDINGS: Brain: Age related atrophy. Moderate chronic small-vessel ischemic changes of the hemispheric white matter. No identifiable acute or subacute infarction, mass lesion, hemorrhage, hydrocephalus or extra-axial collection. Punctate acute infarction seen within the white matter tracts on the right at previous MRI are not specifically discernible by CT. Sellar and suprasellar mass as seen previously. Vascular: There is atherosclerotic calcification of the major vessels at the base of the brain. Skull: Negative Sinuses/Orbits: Clear/normal Other: None IMPRESSION: No acute finding by CT. Atrophy and chronic small-vessel ischemic changes. Small acute right hemispheric white matter infarctions shown by MRI 1  week ago are not discernible by CT. Sellar and suprasellar mass as chronically seen. Electronically Signed   By: Nelson Chimes M.D.   On: 06/01/2021 17:22    Assessment/Plan:   Principal Problem:   Acute CVA (cerebrovascular accident) [facility]) Active Problems:   TIA (transient ischemic attack)   Patient Summary: Neo Yepiz is a 75 y.o. with a pertinent PMH of A. Fib on Eliquis, HFrEF (EF 25% 05/26/21)  2/2 dilated cardiomyopathy, HTN, T2DM, and dementia who presents to the ED with c/o left leg weakness and admitted for acute lacunar CVA on hospital day 5.    Acute lacunar infarct secondary to small vessel disease No new neurological deficits on examination today.  Continue medication optimization as per below. -SNF placement -Home medications include metoprolol, entresto, and spironolactone.   -Entresto and spironolactone held due to soft pressures and low po intake.  -High intensity statin, atorvastatin 80 mg daily -Continue working on risk management (HTN, T2DM) 06/01/21   Dementia Delirium due to hospitalization Patient has not required Ativan over the last 24 hours.  Doing well with mittens and Posey belt.  We will attempt to slowly remove strands as it is safe per patient. His dysarthria is new.  - Seroquel 25 mg qhs -- Remeron and rivastigmine qHS  --CT head wo contrast   Leukocytosis (improving) Patient with leukocytosis of 12.6 which is improved from previous days.   He has remained afebrile and without other signs or symptoms of acute infection.  We will continue to monitor as this may explain his worsening encephalopathy. 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  We will continue to monitor and continue metoprolol 25 mg.  Continue to hold spironolactone and Entresto. - Monitor volume status - Continue metoprolol at 25 mg  AKI on CKD Patient creatinine went up around .4 in less than 48 hours. There was an increase in BUN as well suggesting dehydration as the cause. Patient is not taking in as much due to delirium. Urine appeared dark and patient physical exam was consistent with dehydration.  -Cautious fluid repletion with 500 ml bolus (100 ml/hr)  -Strict ins/outs to ensure adequate fluid intake.   Hypokalemia, resolved K of 4.1 on 10/17 am.   -Continue to monitor  Diet:  dysphagia 3 IVF: None,None VTE: Eliquis Code: Full PT/OT recs: SNF for Subacute PT,. TOC recs: Pending Placement Family Update: Updated  yesterday, family at bedside.   Dispo: Anticipated discharge to Skilled nursing facility pending discontinuation of restraints and need for sedating medications.  [name], MD Tillie Rung. [facility] Internal Medicine Residency, PGY-1  [phone]

## 2021-06-01 NOTE — Progress Notes (Signed)
PT Cancellation Note  Patient Details Name: [name] [name] MRN: [id number] DOB: 05/11/1946   Cancelled Treatment:    Reason Eval/Treat Not Completed: (P) Patient at procedure or test/unavailable (pt off floor for imaging.) Will continue efforts per PT plan of care as schedule permits.   [name] [name] [name] [name] 06/01/2021, 5:04 PM

## 2021-06-02 DIAGNOSIS — I639 Cerebral infarction, unspecified: Secondary | ICD-10-CM | POA: Diagnosis not present

## 2021-06-02 DIAGNOSIS — E440 Moderate protein-calorie malnutrition: Secondary | ICD-10-CM | POA: Insufficient documentation

## 2021-06-02 LAB — CBC WITH DIFFERENTIAL/PLATELET
Abs Immature Granulocytes: 0.07 10*3/uL (ref 0.00–0.07)
Basophils Absolute: 0 10*3/uL (ref 0.0–0.1)
Basophils Relative: 0 %
Eosinophils Absolute: 0 10*3/uL (ref 0.0–0.5)
Eosinophils Relative: 0 %
HCT: 35.8 % — ABNORMAL LOW (ref 39.0–52.0)
Hemoglobin: 12.3 g/dL — ABNORMAL LOW (ref 13.0–17.0)
Immature Granulocytes: 1 %
Lymphocytes Relative: 24 %
Lymphs Abs: 2.8 10*3/uL (ref 0.7–4.0)
MCH: 33.9 pg (ref 26.0–34.0)
MCHC: 34.4 g/dL (ref 30.0–36.0)
MCV: 98.6 fL (ref 80.0–100.0)
Monocytes Absolute: 0.2 10*3/uL (ref 0.1–1.0)
Monocytes Relative: 2 %
Neutro Abs: 8.7 10*3/uL — ABNORMAL HIGH (ref 1.7–7.7)
Neutrophils Relative %: 73 %
Platelets: 197 10*3/uL (ref 150–400)
RBC: 3.63 MIL/uL — ABNORMAL LOW (ref 4.22–5.81)
RDW: 13.4 % (ref 11.5–15.5)
WBC: 11.8 10*3/uL — ABNORMAL HIGH (ref 4.0–10.5)
nRBC: 0 % (ref 0.0–0.2)

## 2021-06-02 LAB — URINALYSIS, COMPLETE (UACMP) WITH MICROSCOPIC: RBC / HPF: >50 RBC/hpf (ref 0–5)

## 2021-06-02 LAB — BASIC METABOLIC PANEL
Anion gap: 10 (ref 5–15)
BUN: 50 mg/dL — ABNORMAL HIGH (ref 8–23)
CO2: 20 mmol/L — ABNORMAL LOW (ref 22–32)
Calcium: 9.2 mg/dL (ref 8.9–10.3)
Chloride: 109 mmol/L (ref 98–111)
Creatinine, Ser: 2.16 mg/dL — ABNORMAL HIGH (ref 0.61–1.24)
GFR, Estimated: 31 mL/min — ABNORMAL LOW (ref >60)
Glucose, Bld: 180 mg/dL — ABNORMAL HIGH (ref 70–99)
Potassium: 3.9 mmol/L (ref 3.5–5.1)
Sodium: 139 mmol/L (ref 135–145)

## 2021-06-02 LAB — GLUCOSE, CAPILLARY
Glucose-Capillary: 180 mg/dL — ABNORMAL HIGH (ref 70–99)
Glucose-Capillary: 175 mg/dL — ABNORMAL HIGH (ref 70–99)
Glucose-Capillary: 197 mg/dL — ABNORMAL HIGH (ref 70–99)
Glucose-Capillary: 154 mg/dL — ABNORMAL HIGH (ref 70–99)

## 2021-06-02 LAB — URINALYSIS, ROUTINE W REFLEX MICROSCOPIC
Bacteria, UA: NONE SEEN
Bilirubin Urine: NEGATIVE
Glucose, UA: NEGATIVE mg/dL
Ketones, ur: NEGATIVE mg/dL
Nitrite: NEGATIVE
Protein, ur: 100 mg/dL — AB
Specific Gravity, Urine: 1.016 (ref 1.005–1.030)
pH: 7 (ref 5.0–8.0)

## 2021-06-02 LAB — PROTEIN / CREATININE RATIO, URINE
Creatinine, Urine: 84.93 mg/dL
Protein Creatinine Ratio: 4.99 mg/mg{Cre} — ABNORMAL HIGH (ref 0.00–0.15)
Total Protein, Urine: 424 mg/dL

## 2021-06-02 LAB — URINALYSIS, MICROSCOPIC (REFLEX)
RBC / HPF: >50 RBC/hpf (ref 0–5)
WBC, UA: >50 WBC/hpf (ref 0–5)

## 2021-06-02 MED ORDER — ADULT MULTIVITAMIN W/MINERALS CH
1.0000 | ORAL_TABLET | Freq: Every day | ORAL | Status: DC
  Administered 2021-06-02 – 2021-07-03 (×32): 1 via ORAL
  Filled 2021-06-02 (×32): qty 1

## 2021-06-02 MED ORDER — CIPROFLOXACIN HCL 500 MG PO TABS
500.0000 mg | ORAL_TABLET | Freq: Two times a day (BID) | ORAL | Status: DC
  Administered 2021-06-02 – 2021-06-05 (×5): 500 mg via ORAL
  Filled 2021-06-02 (×6): qty 1

## 2021-06-02 MED ORDER — POLYETHYLENE GLYCOL 3350 17 G PO PACK
17.0000 g | PACK | Freq: Every day | ORAL | Status: DC
  Administered 2021-06-02 – 2021-06-04 (×3): 17 g via ORAL
  Filled 2021-06-02 (×2): qty 1

## 2021-06-02 MED ORDER — CHLORHEXIDINE GLUCONATE CLOTH 2 % EX PADS
6.0000 | MEDICATED_PAD | Freq: Every day | CUTANEOUS | Status: DC
  Administered 2021-06-02 – 2021-06-23 (×21): 6 via TOPICAL

## 2021-06-02 MED ORDER — ENSURE ENLIVE PO LIQD
237.0000 mL | Freq: Three times a day (TID) | ORAL | Status: DC
  Administered 2021-06-02 – 2021-06-04 (×6): 237 mL via ORAL

## 2021-06-02 MED ORDER — SENNA 8.6 MG PO TABS
1.0000 | ORAL_TABLET | Freq: Every day | ORAL | Status: DC
  Administered 2021-06-02 – 2021-06-03 (×2): 8.6 mg via ORAL
  Filled 2021-06-02 (×2): qty 1

## 2021-06-02 NOTE — Consult Note (Signed)
[name] [name] Admit Date: 05/25/2021 06/02/2021 [name] [name] Requesting Physician:  [name] Danker MD  Reason for Consult:  AoCKD HPI:  4M admitted 10/10 after presenting with left leg weakness and found to have new acute ischemic CVA.  Past history includes A. fib, chronic systolic heart failure, hypertension, DM 2, dementia.  Patient received medical management of acute CVA.  Patient has evidence of CKD with baseline creatinine around 1.5-1.6.  Over the past 3 days creatinine has increased to 2.16.  Yesterday the patient had a renal ultrasound with evidence of bilateral mild hydronephrosis, layering debris in the bladder.  Patient had an INO cath earlier today where a small amount of urine which was colored [place] to purple with clots where removed.  He had a Foley catheter placed just before my arrival.  A large amount of discolored urine with clots were removed.  He now has amber urine in the bag with small clots.  No further urine is draining.  We were not able to flush and retrieve the saline instilled.  Urine analysis earlier today with gross hematuria.    Potassium is 3.9, bicarbonate is 20.  No identified exposure to NSAIDs, IV contrast.  No obvious nephrotoxic exposures.    Creatinine, Ser (mg/dL)  Date Value  06/02/2021 2.16 (H)  06/01/2021 2.13 (H)  05/30/2021 1.73 (H)  05/29/2021 1.75 (H)  05/28/2021 1.62 (H)  05/27/2021 1.51 (H)  05/26/2021 1.46 (H)  05/25/2021 1.58 (H)  05/25/2021 1.60 (H)  05/25/2021 1.71 (H)  ] I/Os: I/O last 3 completed shifts: In: 24 [P.O.:820] Out: 750 [Urine:750]   ROS NSAIDS: no exposure IV Contrast no exposure TMP/SMX no exposure Hypotension not present Balance of 12 systems is negative w/ exceptions as above  PMH  Past Medical History:  Diagnosis Date   Arthritis    both knees   Diabetes mellitus without complication (HCC)    type II   Headache    Hypertension    PSH  Past Surgical History:  Procedure Laterality Date    COLONOSCOPY     DG BARIUM ENEMA ([facility] HX)     HIP ARTHROPLASTY Right    KNEE ARTHROPLASTY Right 08/04/2017   Procedure: RIGHT TOTAL KNEE ARTHROPLASTY WITH COMPUTER NAVIGATION;  Surgeon: Rod Can, MD;  Location: [facility] ORS;  Service: Orthopedics;  Laterality: Right;  Adductor Block   KNEE ARTHROSCOPY Right 2004   FH History reviewed. No pertinent family history. SH  reports that he has never smoked. He has never used smokeless tobacco. He reports current alcohol use. He reports that he does not use drugs. Allergies No Known Allergies Home medications Prior to Admission medications   Medication Sig Start Date End Date Taking? Authorizing Provider  acetaminophen (TYLENOL) 500 MG tablet Take 1,000 mg by mouth every 6 (six) hours as needed for mild pain.   Yes [provider]  apixaban (ELIQUIS) 5 MG TABS tablet Take 5 mg by mouth daily. 01/21/21  Yes [provider]  Brinzolamide-Brimonidine 1-0.2 % SUSP Place 1 drop into both eyes in the morning, at noon, and at bedtime. 12/09/20  Yes [provider]  Cholecalciferol 50 MCG (2000 UT) TABS Take 50 mcg by mouth daily. 04/27/21  Yes [provider]  finasteride (PROSCAR) 5 MG tablet Take 5 mg by mouth daily. 11/10/20  Yes [provider]  latanoprost (XALATAN) 0.005 % ophthalmic solution Place 1 drop into both eyes at bedtime. 08/24/18  Yes [provider]  metoprolol succinate (TOPROL-XL) 100 MG 24 hr tablet  Take 50 mg by mouth daily. 05/14/21  Yes [provider]  sacubitril-valsartan (ENTRESTO) 24-26 MG Take 1 tablet by mouth 2 (two) times daily. 01/09/21  Yes [provider]  sildenafil (VIAGRA) 100 MG tablet Take 100 mg by mouth daily as needed for erectile dysfunction.   Yes [provider]  spironolactone (ALDACTONE) 25 MG tablet Take 12.5 mg by mouth daily. 05/14/21  Yes [provider]  tamsulosin (FLOMAX) 0.4 MG CAPS capsule Take 0.4 mg by mouth daily.   Yes  [provider]  vitamin B-12 (CYANOCOBALAMIN) 500 MCG tablet Take 1,000 mcg by mouth daily. 04/27/21  Yes [provider]    Current Medications Scheduled Meds:  apixaban  5 mg Oral BID   atorvastatin  80 mg Oral Daily   brinzolamide  1 drop Both Eyes TID   And   brimonidine  1 drop Both Eyes TID   feeding supplement  237 mL Oral TID BM   finasteride  5 mg Oral Daily   insulin aspart  0-9 Units Subcutaneous TID WC   latanoprost  1 drop Both Eyes QHS   metoprolol succinate  25 mg Oral Daily   mirtazapine  7.5 mg Oral QHS   multivitamin with minerals  1 tablet Oral Daily   polyethylene glycol  17 g Oral Daily   QUEtiapine  25 mg Oral QHS   rivastigmine  4.6 mg Transdermal Daily   senna  1 tablet Oral QHS   sodium chloride flush  3 mL Intravenous Q12H   tamsulosin  0.4 mg Oral Daily   Continuous Infusions: PRN Meds:.acetaminophen **OR** acetaminophen  CBC Recent Labs  Lab 05/30/21 0517 06/01/21 0355 06/02/21 0357  WBC 13.5* 12.6* 11.8*  NEUTROABS  --  9.4* 8.7*  HGB 14.1 13.5 12.3*  HCT 40.9 39.3 35.8*  MCV 98.1 98.5 98.6  PLT 195 190 588   Basic Metabolic Panel Recent Labs  Lab 05/27/21 0409 05/28/21 0844 05/29/21 0327 05/30/21 0517 06/01/21 0355 06/02/21 0357  NA 137 137 138 137 138 139  K 3.2* 3.4* 3.8 3.5 4.1 3.9  CL 103 103 106 105 105 109  CO2 23 22 24  21* 23 20*  GLUCOSE 159* 122* 119* 122* 146* 180*  BUN 11 12 13 18  38* 50*  CREATININE 1.51* 1.62* 1.75* 1.73* 2.13* 2.16*  CALCIUM 9.3 9.6 9.7 9.6 9.5 9.2    Physical Exam   Blood pressure 123/78, pulse 86, temperature 98.4 F (36.9 C), temperature source Oral, resp. rate 14, SpO2 98 %. GEN: NAD, elderly mal resting calmly ENT: NCAT EYES: Eyes closed CV: RRR nl s1s2 PULM: CTAB ABD: s/nt/nd SKIN: no rashes/lesions EXT:No LEE   Assessment 44M with acute ischemic CVA 10/10 and now with AoCKD3, renal US with b/l HN, and passing some clots in urine, suggestive of obstruction at  the level of the bladder.  AoCKD3, BL SCr 1.5; Renal US 10/17 with mild b/l HN and debris in bladder.  UA with gross hematuria.  Probably obstructive Acute CVA 10/10 HTN, BPs ok DM2 Presumed BPH on finateride AFib on [place] foley, keep in Will d/w Urology if needs irrigation Repeat bladder scan after foley placed Daily weights, Daily Renal Panel, Strict I/Os, Avoid nephrotoxins (NSAIDs, judicious IV Contrast)    [name] [name]  [phone] pgr 06/02/2021, 4:07 PM

## 2021-06-02 NOTE — Progress Notes (Signed)
Physical Therapy Treatment Patient Details Name: [name] [name] MRN: [id number] DOB: 12-05-1945 Today's Date: 06/02/2021   History of Present Illness [name] is a 75 y/o male admitted with c/o left leg weakness. [name] has dementia with short-term memory deficits at baseline. 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 PMH: A. 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    PT Comments    Pt received in supine, lethargic but able to be awoken to voice with increased time and participatory with encouragement. Pt needing increased assist for bed mobility and transfers/stepping this date due to lethargy and cognitive deficit, needing up to +2 modA for functional mobility tasks. Emphasis on upright midline posture, seated balance/strengthening UE/LE activities and safety with bed mobility and transfers. Pt continues to benefit from PT services to progress toward functional mobility goals.    Recommendations for follow up therapy are one component of a multi-disciplinary discharge planning process, led by the attending physician.  Recommendations may be updated based on patient status, additional functional criteria and insurance authorization.  Follow Up Recommendations  SNF     Equipment Recommendations  None recommended by PT    Recommendations for Other Services       Precautions / Restrictions Precautions Precautions: Fall Precaution Comments: decreased L awareness, likely Lewy Body dementia Restrictions Weight Bearing Restrictions: No     Mobility  Bed Mobility Overal bed mobility: Needs Assistance Bed Mobility: Supine to Sit;Sit to Supine     Supine to sit: HOB elevated;Mod assist;+2 for physical assistance;+2 for safety/equipment Sit to supine: +2 for safety/equipment;Mod assist;+2 for physical assistance   General bed mobility comments: pt slow to initiate, needs multimodal cues and  increased time as well as trunk/BLE assist    Transfers Overall transfer level: Needs assistance Equipment used: Rolling walker (2 wheeled) Transfers: Sit to/from Stand Sit to Stand: Mod assist;+2 safety/equipment;+2 physical assistance;From elevated surface;Min assist         General transfer comment: initated on his own with minA to rise; pt with poor hand placement and L awareness needing hand over hand assist for steadying grip toward RW and tcs for upright posture; bed height significantly elevated prior to attempts due to pt height; pt needing modA for stand>sit safety and for balance once upright due to L lean  Ambulation/Gait Ambulation/Gait assistance: Mod assist;+2 physical assistance;+2 safety/equipment Gait Distance (Feet): 3 Feet Assistive device: Rolling walker (2 wheeled) Gait Pattern/deviations: Shuffle;Decreased stride length;Step-to pattern     General Gait Details: dense vcs/tcs cues for step sequencing toward HOB, pt needs manual assist to manage RW; pt impulsive to sit when fatigued   Stairs             Wheelchair Mobility    Modified Rankin (Stroke Patients Only) Modified Rankin (Stroke Patients Only) Pre-Morbid Rankin Score: No significant disability Modified Rankin: Moderately severe disability     Balance Overall balance assessment: Needs assistance Sitting-balance support: Feet supported Sitting balance-Leahy Scale: Fair Sitting balance - Comments: pt has righting reactions but impulsive and needing min guard to minA for safety at all times Postural control: Posterior lean;Left lateral lean Standing balance support: Bilateral upper extremity supported;During functional activity Standing balance-Leahy Scale: Poor Standing balance comment: min to modA with UE support for static balance +2 for external support and safety  Cognition Arousal/Alertness: Lethargic Behavior During Therapy: Flat  affect;Impulsive Overall Cognitive Status: Impaired/Different from baseline Area of Impairment: Orientation;Memory;Safety/judgement;Problem solving;Following commands;Attention;Awareness                 Orientation Level: Disoriented to;Time;Situation;Place Current Attention Level: Focused Memory: Decreased short-term memory Following Commands: Follows one step commands with increased time;Follows one step commands inconsistently Safety/Judgement: Decreased awareness of safety;Decreased awareness of deficits Awareness: Intellectual Problem Solving: Slow processing;Difficulty sequencing;Requires verbal cues;Requires tactile cues;Decreased initiation General Comments: Pt difficult to understand and more lethargic this date, pt following ~50% of 1-step mobility commands with increased time/encouragement to initiate today.      Exercises Other Exercises Other Exercises: seated BLE AAROM: hip flexion, LAQ x5-10 reps ea Other Exercises: anterior/lateral reaching (hand over hand assist) and cues for midline balance/weight shifting at EOB Other Exercises: STS x2 trials for BLE strengthening    General Comments General comments (skin integrity, edema, etc.): noted dark color (purple hue) to urine, RN/NT aware      Pertinent Vitals/Pain Pain Assessment: No/denies pain    Home Living                      Prior Function            PT Goals (current goals can now be found in the care plan section) Acute Rehab PT Goals Patient Stated Goal: to sit up PT Goal Formulation: Patient unable to participate in goal setting Time For Goal Achievement: 06/09/21 Potential to Achieve Goals: Fair Progress towards PT goals: Progressing toward goals    Frequency    Min 3X/week      PT Plan Current plan remains appropriate    Co-evaluation PT/OT/SLP Co-Evaluation/Treatment: Yes Reason for Co-Treatment: Complexity of the patient's impairments (multi-system involvement);Necessary  to address cognition/behavior during functional activity;For patient/therapist safety;To address functional/ADL transfers PT goals addressed during session: Balance;Mobility/safety with mobility;Proper use of DME;Strengthening/ROM        AM-PAC PT "6 Clicks" Mobility   Outcome Measure  Help needed turning from your back to your side while in a flat bed without using bedrails?: A Little Help needed moving from lying on your back to sitting on the side of a flat bed without using bedrails?: A Lot Help needed moving to and from a bed to a chair (including a wheelchair)?: A Lot Help needed standing up from a chair using your arms (e.g., wheelchair or bedside chair)?: A Lot Help needed to walk in hospital room?: Total Help needed climbing 3-5 steps with a railing? : Total 6 Click Score: 11    End of Session Equipment Utilized During Treatment: Gait belt Activity Tolerance: Patient tolerated treatment well;Patient limited by lethargy Patient left: in bed;with call bell/phone within reach;Other (comment);with restraints reapplied (posey waist belt donned and reapplied to bed, HOB elevated as food had arrived, but mitts remain on per RN request until she has time to assist him to take bites of food, per pt he isn't hungry yet) Nurse Communication: Mobility status;Need for lift equipment (hoyer or Stedy for OOB transfers for safety) PT Visit Diagnosis: Other abnormalities of gait and mobility (R26.89);Hemiplegia and hemiparesis;Muscle weakness (generalized) (M62.81) Hemiplegia - Right/Left: Left Hemiplegia - dominant/non-dominant: Non-dominant Hemiplegia - caused by: Cerebral infarction     Time: 2330-0762 PT Time Calculation (min) (ACUTE ONLY): 26 min  Charges:  $Therapeutic Exercise: 8-22 mins                     Darreld Hoffer P.,  PTA Acute Rehabilitation Services Pager: [phone] Office: [place] 06/02/2021, 1:31 PM

## 2021-06-02 NOTE — Progress Notes (Signed)
Occupational Therapy Treatment Patient Details Name: [name] [name] MRN: [id number] DOB: 01-09-1946 Today's Date: 06/02/2021   History of present illness [name] is a 75 y/o male admitted with c/o left leg weakness. [name] has dementia with short-term memory deficits at baseline. 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 PMH: A. 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   OT comments  [name] [name] was resting upon arrival, easy to rouse but lethargic throughout session. Pt seemingly more confused and required more cues for functional tasks this session compared to previous OT sessions. He required min-mod +2 for bed mobility and sit<>stands with RW. He complete side stepping for min A +2 and required verbal cues for sequencing. Di not progress mobility further due to lethargy, confusion, command following and safety. Pt 's speech was jumbled this session, and somewhat difficult to re-direct to tasks. Pt continues to benefit from OT acutely. D/c plan remains appropriate.    Recommendations for follow up therapy are one component of a multi-disciplinary discharge planning process, led by the attending physician.  Recommendations may be updated based on patient status, additional functional criteria and insurance authorization.    Follow Up Recommendations  SNF;Supervision/Assistance - 24 hour    Equipment Recommendations  None recommended by OT    Recommendations for Other Services      Precautions / Restrictions Precautions Precautions: Fall Precaution Comments: decreased L awareness, likely Lewy Body dementia Restrictions Weight Bearing Restrictions: No       Mobility Bed Mobility Overal bed mobility: Needs Assistance Bed Mobility: Supine to Sit;Sit to Supine Rolling: Min assist   Supine to sit: HOB elevated;Mod assist;+2 for physical assistance;+2 for safety/equipment Sit to supine: +2 for  safety/equipment;Mod assist;+2 for physical assistance   General bed mobility comments: pt slow to initiate, needs multimodal cues and increased time as well as trunk/BLE assist    Transfers Overall transfer level: Needs assistance Equipment used: Rolling walker (2 wheeled) Transfers: Sit to/from Stand Sit to Stand: Mod assist;+2 safety/equipment;+2 physical assistance;From elevated surface;Min assist         General transfer comment: initated on his own with minA to rise; pt with poor hand placement and L awareness needing hand over hand assist for steadying grip toward RW and tcs for upright posture; bed height significantly elevated prior to attempts due to pt height; pt needing modA for stand>sit safety and for balance once upright due to L lean    Balance Overall balance assessment: Needs assistance Sitting-balance support: Feet supported Sitting balance-[name] [name] Scale: Fair Sitting balance - Comments: pt has righting reactions but impulsive and needing min guard to minA for safety at all times Postural control: Posterior lean;Left lateral lean Standing balance support: Bilateral upper extremity supported;During functional activity Standing balance-[name] [name] Scale: Poor Standing balance comment: min to modA with UE support for static balance +2 for external support and safety                           ADL either performed or assessed with clinical judgement   ADL Overall ADL's : Needs assistance/impaired Eating/Feeding: Supervision/ safety;Set up;Minimal assistance;Sitting Eating/Feeding Details (indicate cue type and reason): attempted to set pt up for self feeding at the end of the session, pt inappropriately pulling at line once mitts were takens off - had to don mitts, RN aware pt will require observer or assist with feeding  Functional mobility during ADLs: Moderate assistance;+2 for physical assistance;+2 for  safety/equipment General ADL Comments: Pt follow ~50% of verbal commands this session, requried incrased cueing and encouragement for sitting balance and OOB transfer. pt more lethargic and confused this session     Vision       Perception     Praxis      Cognition Arousal/Alertness: Lethargic Behavior During Therapy: Flat affect;Impulsive Overall Cognitive Status: Impaired/Different from baseline Area of Impairment: Orientation;Memory;Safety/judgement;Problem solving;Following commands;Attention;Awareness                 Orientation Level: Disoriented to;Time;Situation;Place Current Attention Level: Focused Memory: Decreased short-term memory Following Commands: Follows one step commands with increased time;Follows one step commands inconsistently Safety/Judgement: Decreased awareness of safety;Decreased awareness of deficits Awareness: Intellectual Problem Solving: Slow processing;Difficulty sequencing;Requires verbal cues;Requires tactile cues;Decreased initiation General Comments: Pt difficult to understand and more lethargic this date, pt following ~50% of 1-step mobility commands with increased time/encouragement to initiate today.        Exercises Exercises: Other exercises Other Exercises Other Exercises: seated BLE AAROM: hip flexion, LAQ x5-10 reps ea Other Exercises: anterior/lateral reaching (hand over hand assist) and cues for midline balance/weight shifting at EOB Other Exercises: STS x2 trials for BLE strengthening   Shoulder Instructions       General Comments dark urine - RN and NT aware    Pertinent Vitals/ Pain       Pain Assessment: Faces Faces Pain Scale: Hurts a little bit Pain Location: generalized with movement, incrsaed once sitting EOB. Complained of back pain 1x Pain Intervention(s): Limited activity within patient's tolerance;Monitored during session  Home Living                                          Prior  Functioning/Environment              Frequency  Min 2X/week        Progress Toward Goals  OT Goals(current goals can now be found in the care plan section)  Progress towards OT goals: Progressing toward goals  Acute Rehab OT Goals Patient Stated Goal: to sit up OT Goal Formulation: With patient Potential to Achieve Goals: Fair ADL Goals Pt Will Perform Grooming: with min guard assist;standing Pt Will Perform Lower Body Bathing: with min guard assist;sit to/from stand Pt Will Perform Lower Body Dressing: with min guard assist;sit to/from stand Pt Will Transfer to Toilet: with min guard assist;ambulating Pt Will Perform Toileting - Clothing Manipulation and hygiene: with min guard assist;sitting/lateral leans Pt Will Perform Tub/Shower Transfer: with min guard assist;ambulating;tub bench  Plan Discharge plan remains appropriate;Frequency remains appropriate    Co-evaluation    PT/OT/SLP Co-Evaluation/Treatment: Yes Reason for Co-Treatment: Complexity of the patient's impairments (multi-system involvement);For patient/therapist safety;Necessary to address cognition/behavior during functional activity;To address functional/ADL transfers PT goals addressed during session: Balance;Mobility/safety with mobility;Proper use of DME;Strengthening/ROM OT goals addressed during session: ADL's and self-care;Proper use of Adaptive equipment and DME      AM-PAC OT "6 Clicks" Daily Activity     Outcome Measure   Help from another person eating meals?: A Little Help from another person taking care of personal grooming?: A Little Help from another person toileting, which includes using toliet, bedpan, or urinal?: A Lot Help from another person bathing (including washing, rinsing, drying)?: A Lot Help from another person to put on and taking off  regular upper body clothing?: A Little Help from another person to put on and taking off regular lower body clothing?: A Lot 6 Click Score:  15    End of Session Equipment Utilized During Treatment: Gait belt;Rolling walker  OT Visit Diagnosis: Unsteadiness on feet (R26.81);Other abnormalities of gait and mobility (R26.89);Muscle weakness (generalized) (M62.81);Repeated falls (R29.6);History of falling (Z91.81);Pain   Activity Tolerance Patient tolerated treatment well   Patient Left in bed;with bed alarm set   Nurse Communication Mobility status        Time: 4982-6415 OT Time Calculation (min): 25 min  Charges: OT General Charges $OT Visit: 1 Visit OT Treatments $Self Care/Home Management : 8-22 mins    Gates Jividen A Tatum Corl 06/02/2021, 1:44 PM

## 2021-06-02 NOTE — Progress Notes (Signed)
Initial Nutrition Assessment  DOCUMENTATION CODES:  Non-severe (moderate) malnutrition in context of chronic illness  INTERVENTION:  Obtain updated weight.  Consider starting a bowel regimen.  Add Ensure Plus High Protein po TID, each supplement provides 350 kcal and 20 grams of protein.   Add Magic cup TID with meals, each supplement provides 290 kcal and 9 grams of protein.  Add MVI with minerals daily.  Recommend feeding assistance with meals.  NUTRITION DIAGNOSIS:  Moderate Malnutrition related to chronic illness (dementia) as evidenced by moderate fat depletion, moderate muscle depletion.  GOAL:  Patient will meet greater than or equal to 90% of their needs  MONITOR:  PO intake, Supplement acceptance, Labs, Weight trends, I & O's  REASON FOR ASSESSMENT:  Consult Assessment of nutrition requirement/status  ASSESSMENT:  75 yo male with a PMH of  A. 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  Anticipated discharge to SNF pending discontinuation of restraints and need for sedating medications.  Attempted to speak with pt at bedside. Pt mumbling, confused - likely from his dementia.  Per Epic, pt has an intake average of 31% over the past 8 meals. Documentation is consistent.  Pt not weighed during this admission. RD to order measured weight to determine weight status.  Of note, pt has not had a bowel movement during admission. RD to alert MD to begin an oral bowel regimen.  Recommend adding Ensure TID, Magic Cup TID, and MVI with minerals. Also pt may benefit from feeding assistance daily.  Medications: reviewed; SSI, Remeron  Labs: reviewed; CBG 118- 218 (H), BUN 50 (H - trending up), Crt 2.16 (H - trending up) HbA1c: 5.8% (05/25/2021)  NUTRITION - FOCUSED PHYSICAL EXAM: Flowsheet Row Most Recent Value  Orbital Region Moderate depletion  Upper Arm Region Moderate depletion  Thoracic  and Lumbar Region No depletion  Buccal Region Moderate depletion  Temple Region Severe depletion  Clavicle Bone Region Moderate depletion  Clavicle and Acromion Bone Region Moderate depletion  Scapular Bone Region Mild depletion  Dorsal Hand Unable to assess  [Mitts]  Patellar Region Moderate depletion  Anterior Thigh Region Moderate depletion  Posterior Calf Region Moderate depletion  Edema (RD Assessment) None  Hair Reviewed  Eyes Reviewed  Mouth Reviewed  Skin Reviewed  Nails Unable to assess  [Mitts]   Diet Order:   Diet Order             DIET DYS 3 Room service appropriate? No; Fluid consistency: Thin  Diet effective 1000                  EDUCATION NEEDS:  Not appropriate for education at this time  Skin:  Skin Assessment: Reviewed RN Assessment  Last BM:  PTA  Height:  Ht Readings from Last 1 Encounters:  03/18/20 6\' 3"  (1.905 m)   Weight:  Wt Readings from Last 1 Encounters:  03/18/20 102.8 kg   BMI:  There is no height or weight on file to calculate BMI.  Estimated Nutritional Needs:  Kcal:  2000-2200 Protein:  115-130 grams Fluid:  >2 L  Derrel Nip, RD, LDN (she/her/hers) Registered Dietitian I After-Hours/Weekend Pager # in [place]

## 2021-06-02 NOTE — Progress Notes (Addendum)
HD#6 Subjective:  Overnight Events: NAEON  Patient continued to have dysarthric speech. He appeared to be confused. He denied any pain. Patient did not endorse a recent bowel movement.   Objective:  Vital signs in last 24 hours: Vitals:   06/01/21 1507 06/01/21 2001 06/01/21 2334 06/02/21 0406  BP: (!) 138/125 119/70 127/67 114/76  Pulse: 66 (!) 51 (!) 56 (!) 55  Resp: 18 20 16 18   Temp: 98.5 F (36.9 C) 98.8 F (37.1 C) 98 F (36.7 C) 98.3 F (36.8 C)  TempSrc: Oral Oral Oral Oral  SpO2: 99%  97% 97%   Supplemental O2: Room Air SpO2: 97 %   Physical Exam:   Physical Exam General: NAD Head: Normocephalic without scalp lesions.  Eyes: Conjunctivae pink, sclerae white, without jaundice.  Mouth: Lips normal color, without lesions. Dry mucus membrane. Neck: Neck supple with full range of motion (ROM).  Lungs: CTAB, no wheeze, rhonchi or rales. Anterior ausculation with poor effort by patient.  Cardiovascular: Normal heart sounds, no r/m/g. Abdomen: No TTP, normal bowel sounds MSK: No asymmetry or muscle atrophy. Full range of motion (ROM) of all joints. No injuries noted.  Skin: warm, dry good skin turgor, no lesions Neuro: Alert and oriented. CN grossly intact, dysarthria Psych: Normal mood and normal affect    Intake/Output Summary (Last 24 hours) at 06/02/2021 0507 Last data filed at 06/01/2021 1820 Gross per 24 hour  Intake 480 ml  Output 550 ml  Net -70 ml    Net IO Since Admission: 8 mL [06/02/21 0507]  Pertinent Labs: CBC Latest Ref Rng & Units 06/02/2021 06/01/2021 05/30/2021  WBC 4.0 - 10.5 K/uL 11.8(H) 12.6(H) 13.5(H)  Hemoglobin 13.0 - 17.0 g/dL 12.3(L) 13.5 14.1  Hematocrit 39.0 - 52.0 % 35.8(L) 39.3 40.9  Platelets 150 - 400 K/uL 197 190 195   CMP Latest Ref Rng & Units 06/01/2021 05/30/2021 05/29/2021  Glucose 70 - 99 mg/dL 146(H) 122(H) 119(H)  BUN 8 - 23 mg/dL 38(H) 18 13  Creatinine 0.61 - 1.24 mg/dL 2.13(H) 1.73(H) 1.75(H)  Sodium  135 - 145 mmol/L 138 137 138  Potassium 3.5 - 5.1 mmol/L 4.1 3.5 3.8  Chloride 98 - 111 mmol/L 105 105 106  CO2 22 - 32 mmol/L 23 21(L) 24  Calcium 8.9 - 10.3 mg/dL 9.5 9.6 9.7  Total Protein 6.5 - 8.1 g/dL - - -  Total Bilirubin 0.3 - 1.2 mg/dL - - -  Alkaline Phos 38 - 126 U/L - - -  AST 15 - 41 U/L - - -  ALT 0 - 44 U/L - - -    Imaging: CT HEAD WO CONTRAST (5MM)  Result Date: 06/01/2021 CLINICAL DATA:  Neuro deficit, acute, stroke suspected.  Dysarthria. EXAM: CT HEAD WITHOUT CONTRAST TECHNIQUE: Contiguous axial images were obtained from the base of the skull through the vertex without intravenous contrast. COMPARISON:  CT and MRI studies 05/25/2021 FINDINGS: Brain: Age related atrophy. Moderate chronic small-vessel ischemic changes of the hemispheric white matter. No identifiable acute or subacute infarction, mass lesion, hemorrhage, hydrocephalus or extra-axial collection. Punctate acute infarction seen within the white matter tracts on the right at previous MRI are not specifically discernible by CT. Sellar and suprasellar mass as seen previously. Vascular: There is atherosclerotic calcification of the major vessels at the base of the brain. Skull: Negative Sinuses/Orbits: Clear/normal Other: None IMPRESSION: No acute finding by CT. Atrophy and chronic small-vessel ischemic changes. Small acute right hemispheric white matter infarctions shown by MRI  1 week ago are not discernible by CT. Sellar and suprasellar mass as chronically seen. Electronically Signed   By: Nelson Chimes M.D.   On: 06/01/2021 17:22   US RENAL  Result Date: 06/01/2021 CLINICAL DATA:  Acute kidney injury EXAM: RENAL / URINARY TRACT ULTRASOUND COMPLETE COMPARISON:  None. FINDINGS: Right Kidney: Renal measurements: 10.0 x 5.7 x 5.4 cm = volume: 161 mL. Mild right hydronephrosis. Normal echotexture. No mass. Left Kidney: Renal measurements: 10.4 x 5.2 x 5.0 cm = volume: 141 mL. Mild left hydronephrosis. Normal echotexture.  Probable upper pole cyst measuring 3.6 cm. Bladder: Layering debris posteriorly. Other: None. IMPRESSION: Mild bilateral hydronephrosis. Layering debris within the bladder. Electronically Signed   By: Rolm Baptise M.D.   On: 06/01/2021 20:42    Assessment/Plan:   Principal Problem:   Acute CVA (cerebrovascular accident) [facility]) Active Problems:   TIA (transient ischemic attack)  Patient Summary: [name] [name] is a 75 y.o. with a pertinent PMH of A. 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   -Entresto and spironolactone held due to soft pressures. -High intensity statin, atorvastatin 80 mg daily -Continue working on risk management (HTN, T2DM)   Acute delirium due to hospitalization with underlying dementia Patient has not required Ativan recently. Doing well with mittens and Posey belt.  We will attempt to slowly remove these as it is safe per patient. Fluctuating dysarthria. CT did not show any acute findings. - Seroquel 25 mg qhs --Remeron and rivastigmine qHS   AKI on CKD3a Patient creatinine increasing >0.3 in less than 48 hours. Patient has decreased PO intake due to delirium, no improvement with small fluid bolus. UA 10/17 showing large Hgb and protein, no reflex microscopy. Discussed with lab and repeat on same sample with microscopy showed leukocytes, many bacteria, triple phosphate crystals and RBCs. Culture pending. Discussed consult with nephrology given AKI, hematuria, and proteinuria. Renal ultrasound also showing mild bilateral hydronephrosis. In/out resulted in 350 ml out with bladder scan showing residual volume of 313 ml. Elevated protein creatinine ratio at  4.99.  -Nephrology consulted   -They recommend foley, daily weights, daily renal panel, strict I/Os, and avoidance of nephrotoxins. Urology consult if clots persist.   Leukocytosis (improving) Patient with leukocytosis of 11.8 which is improved from previous days. He has remained afebrile and without other signs or symptoms of acute infection.  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  We will continue to monitor and continue metoprolol 25 mg.  Continue to hold spironolactone and Entresto. - Monitor volume status, euvolemic to mildly hypovolemic on exam. Continue to encourage PO intake as above. - Continue metoprolol at 25 mg  Hypokalemia, resolved K of 3.9 on 10/18 am.   -Continue to monitor  Diet:  dysphagia 3 IVF: None,None VTE: Eliquis Code: Full PT/OT recs: SNF for Subacute PT,. TOC recs: Pending Placement/needs further medical workup Family Update: Updated wife on 06/02/21, family at bedside.   Dispo: Anticipated discharge to Skilled nursing facility pending discontinuation of restraints and need for sedating medications.  [name], MD [facility]. [facility],  PGY-1  [phone]

## 2021-06-02 NOTE — Consult Note (Signed)
I have been asked to see the patient by Dr. Charise Killian, for evaluation and management of difficulty with Foley catheter.  History of present illness: 75 year old male who was admitted 1 week prior concern for stroke.  He was found to have left-sided weakness and progressive dementia.  He has had minimal urine output over the course of the last week.  It appears that he had a condom catheter on for most of his hospitalization.  Today, the nurse reports perform clean intermittent catheterization and getting turbid appearing urine.  She subsequently placed a Foley catheter and initially 200 cc drained, and then the Foley stopped draining.  The patient was noted to be full suprapubically and was complaining of some pain with palpation in that area.  Further, the patient has noted to have worsening renal function over the past several days.  An ultrasound was performed demonstrating a distended bladder and bilateral hydro nephrosis.  I was consulted for further evaluation and to troubleshoot the Foley catheter.  Review of systems: A 12 point comprehensive review of systems was obtained and is negative unless otherwise stated in the history of present illness.  Patient Active Problem List   Diagnosis Date Noted   Malnutrition of moderate degree 06/02/2021   TIA (transient ischemic attack) 05/27/2021   Acute CVA (cerebrovascular accident) ([place]) 05/26/2021   Osteoarthritis of right knee 08/04/2017    No current facility-administered medications on file prior to encounter.   Current Outpatient Medications on File Prior to Encounter  Medication Sig Dispense Refill   acetaminophen (TYLENOL) 500 MG tablet Take 1,000 mg by mouth every 6 (six) hours as needed for mild pain.     apixaban (ELIQUIS) 5 MG TABS tablet Take 5 mg by mouth daily.     Brinzolamide-Brimonidine 1-0.2 % SUSP Place 1 drop into both eyes in the morning, at noon, and at bedtime.     Cholecalciferol 50 MCG (2000 UT) TABS Take 50 mcg by  mouth daily.     finasteride (PROSCAR) 5 MG tablet Take 5 mg by mouth daily.     latanoprost (XALATAN) 0.005 % ophthalmic solution Place 1 drop into both eyes at bedtime.     metoprolol succinate (TOPROL-XL) 100 MG 24 hr tablet Take 50 mg by mouth daily.     sacubitril-valsartan (ENTRESTO) 24-26 MG Take 1 tablet by mouth 2 (two) times daily.     sildenafil (VIAGRA) 100 MG tablet Take 100 mg by mouth daily as needed for erectile dysfunction.     spironolactone (ALDACTONE) 25 MG tablet Take 12.5 mg by mouth daily.     tamsulosin (FLOMAX) 0.4 MG CAPS capsule Take 0.4 mg by mouth daily.     vitamin B-12 (CYANOCOBALAMIN) 500 MCG tablet Take 1,000 mcg by mouth daily.      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 No pertinent family history.  PE: Vitals:   06/02/21 0406 06/02/21 0723 06/02/21 1121 06/02/21 1537  BP: 114/76 113/81 (!) 113/94 123/78  Pulse: (!) 55 74 94 86  Resp: 18 14 16 14   Temp: 98.3 F (36.8 C) 97.8 F (36.6 C) 98.4 F (36.9 C) 98.4 F (36.9 C)  TempSrc: Oral Oral Oral Oral  SpO2: 97% 100% 96% 98%   Patient appears to be in no acute distress  patient is alert, but completely disoriented Atraumatic normocephalic head No cervical or  supraclavicular lymphadenopathy appreciated No increased work of breathing, no audible wheezes/rhonchi Regular sinus rhythm/rate Abdomen is soft, nontender, nondistended, no CVA, definite wincing and tenderness to the suprapubic area with palpation Lower extremities are symmetric without appreciable edema Grossly neurologically intact No identifiable skin lesions  Recent Labs    06/01/21 0355 06/02/21 0357  WBC 12.6* 11.8*  HGB 13.5 12.3*  HCT 39.3 35.8*   Recent Labs    06/01/21 0355 06/02/21 0357  NA 138 139  K 4.1 3.9  CL 105 109  CO2 23 20*  GLUCOSE 146* 180*  BUN 38* 50*  CREATININE 2.13* 2.16*  CALCIUM 9.5 9.2   No results for input(s): LABPT, INR in the last 72 hours. No results for input(s): LABURIN in the last 72 hours. Results for orders placed or performed during the hospital encounter of 05/25/21  Resp Panel by RT-PCR (Flu A&B, Covid) Nasopharyngeal Swab     Status: None   Collection Time: 05/25/21  1:08 PM   Specimen: Nasopharyngeal Swab; Nasopharyngeal(NP) swabs in vial transport medium  Result Value Ref Range Status   SARS Coronavirus 2 by RT PCR NEGATIVE NEGATIVE Final    Comment: (NOTE) SARS-CoV-2 target nucleic acids are NOT DETECTED.  The SARS-CoV-2 RNA is generally detectable in upper respiratory specimens during the acute phase of infection. The lowest concentration of SARS-CoV-2 viral copies this assay can detect is 138 copies/mL. A negative result does not preclude SARS-Cov-2 infection and should not be used as the sole basis for treatment or other patient management decisions. A negative result may occur with  improper specimen collection/handling, submission of specimen other than nasopharyngeal swab, presence of viral mutation(s) within the areas targeted by this assay, and inadequate number of viral copies(<138 copies/mL). A negative result must be combined with clinical observations, patient history, and epidemiological information. The  expected result is Negative.  Fact Sheet for Patients:  EntrepreneurPulse.com.au  Fact Sheet for Healthcare Providers:  IncredibleEmployment.be  This test is no t yet approved or cleared by the [place] FDA and  has been authorized for detection and/or diagnosis of SARS-CoV-2 by FDA under an Emergency Use Authorization (EUA). This EUA will remain  in effect (meaning this test can be used) for the duration of the COVID-19 declaration under Section 564(b)(1) of the Act, 21 U.S.C.section 360bbb-3(b)(1), unless the authorization is terminated  or revoked sooner.       Influenza A by PCR NEGATIVE NEGATIVE Final   Influenza B by PCR NEGATIVE NEGATIVE Final    Comment: (NOTE) The Xpert Xpress SARS-CoV-2/FLU/RSV plus assay is intended as an aid in the diagnosis of influenza from Nasopharyngeal swab specimens and should not be used as a sole basis for treatment. Nasal washings and aspirates are unacceptable  for Xpert Xpress SARS-CoV-2/FLU/RSV testing.  Fact Sheet for Patients: EntrepreneurPulse.com.au  Fact Sheet for Healthcare Providers: IncredibleEmployment.be  This test is not yet approved or cleared by the [place] FDA and has been authorized for detection and/or diagnosis of SARS-CoV-2 by FDA under an Emergency Use Authorization (EUA). This EUA will remain in effect (meaning this test can be used) for the duration of the COVID-19 declaration under Section 564(b)(1) of the Act, 21 U.S.C. section 360bbb-3(b)(1), unless the authorization is terminated or revoked.  Performed at [facility], [place] [address], [place], [place] [address]     Imaging: I reviewed the patient's renal ultrasound which demonstrated mild to moderate bilateral hydronephrosis and a distended bladder with layering debris.  Procedure: I aspirated the 14 French Foley catheter initially and was able to get it to flow.  It  was flowing quite slowly given the thick turbid foul-smelling urine.  Ultimately, 1900 cc of turbid foul-smelling crankcase oil -like urine was drained.  I then placed a 20 [place] two-way Foley catheter and continue to hand irrigate using a liter of normal saline.  I did aspirate some clots, and ultimately the urine turned pink to slight red.  At this time I opted to let the catheter drained of gravity.  Imp: Suspect the patient was slowly developing urinary retention over the last week following a stroke, likely with some bladder neglect.  The result is that he likely has a likely developed postobstructive azotemia and a bladder infection given the smell of the urine.  I suspect given the degree of distention he is also developed some hematuria.  With decompression this should resolve.  I did not leave the patient on continuous bladder irrigation, I thought that this may be problematic for the floor nursing staff as well as the patient himself.    Recommendations: The patient had severe urinary retention, likely the etiology is worsening renal failure.  Suspect the bleeding is a result of severe bladder distention and/or cystitis.  I would recommend that the patient keep his Foley catheter until he is more lucid to participate in voiding.  At this time, I do not think that a condom catheter is appropriate, the patient is unable to void at all on his own.  We recommend that the catheter be left for a minimum of 7 days.  I am starting the patient on antibiotics to cover his presumed urinary tract infection.  A urine culture was sent today.  Narrow the antibiotic as able or if necessary.   Thank you for involving me in this patient's care, I will continue to follow along. Marland Kitchen Ardis Hughs

## 2021-06-03 DIAGNOSIS — I639 Cerebral infarction, unspecified: Secondary | ICD-10-CM | POA: Diagnosis not present

## 2021-06-03 LAB — CBC WITH DIFFERENTIAL/PLATELET
Abs Immature Granulocytes: 0.04 10*3/uL (ref 0.00–0.07)
Basophils Absolute: 0 10*3/uL (ref 0.0–0.1)
Basophils Relative: 0 %
Eosinophils Absolute: 0 10*3/uL (ref 0.0–0.5)
Eosinophils Relative: 0 %
HCT: 35 % — ABNORMAL LOW (ref 39.0–52.0)
Hemoglobin: 11.5 g/dL — ABNORMAL LOW (ref 13.0–17.0)
Immature Granulocytes: 0 %
Lymphocytes Relative: 30 %
Lymphs Abs: 3 10*3/uL (ref 0.7–4.0)
MCH: 33.5 pg (ref 26.0–34.0)
MCHC: 32.9 g/dL (ref 30.0–36.0)
MCV: 102 fL — ABNORMAL HIGH (ref 80.0–100.0)
Monocytes Absolute: 0.2 10*3/uL (ref 0.1–1.0)
Monocytes Relative: 2 %
Neutro Abs: 6.9 10*3/uL (ref 1.7–7.7)
Neutrophils Relative %: 68 %
Platelets: 191 10*3/uL (ref 150–400)
RBC: 3.43 MIL/uL — ABNORMAL LOW (ref 4.22–5.81)
RDW: 13.5 % (ref 11.5–15.5)
WBC: 10.2 10*3/uL (ref 4.0–10.5)
nRBC: 0 % (ref 0.0–0.2)

## 2021-06-03 LAB — URINE CULTURE

## 2021-06-03 LAB — GLUCOSE, CAPILLARY
Glucose-Capillary: 148 mg/dL — ABNORMAL HIGH (ref 70–99)
Glucose-Capillary: 135 mg/dL — ABNORMAL HIGH (ref 70–99)
Glucose-Capillary: 126 mg/dL — ABNORMAL HIGH (ref 70–99)
Glucose-Capillary: 132 mg/dL — ABNORMAL HIGH (ref 70–99)

## 2021-06-03 LAB — RENAL FUNCTION PANEL
Albumin: 2.5 g/dL — ABNORMAL LOW (ref 3.5–5.0)
Anion gap: 8 (ref 5–15)
BUN: 50 mg/dL — ABNORMAL HIGH (ref 8–23)
CO2: 22 mmol/L (ref 22–32)
Calcium: 8.8 mg/dL — ABNORMAL LOW (ref 8.9–10.3)
Chloride: 106 mmol/L (ref 98–111)
Creatinine, Ser: 2.05 mg/dL — ABNORMAL HIGH (ref 0.61–1.24)
GFR, Estimated: 33 mL/min — ABNORMAL LOW (ref >60)
Glucose, Bld: 170 mg/dL — ABNORMAL HIGH (ref 70–99)
Phosphorus: 4.2 mg/dL (ref 2.5–4.6)
Potassium: 4.2 mmol/L (ref 3.5–5.1)
Sodium: 136 mmol/L (ref 135–145)

## 2021-06-03 LAB — VITAMIN B12: Vitamin B-12: 1360 pg/mL — ABNORMAL HIGH (ref 180–914)

## 2021-06-03 LAB — FOLATE: Folate: 5.5 ng/mL — ABNORMAL LOW (ref >5.9)

## 2021-06-03 MED ORDER — FOLIC ACID 1 MG PO TABS
1.0000 mg | ORAL_TABLET | Freq: Every day | ORAL | Status: DC
  Administered 2021-06-03 – 2021-07-03 (×31): 1 mg via ORAL
  Filled 2021-06-03 (×32): qty 1

## 2021-06-03 NOTE — Plan of Care (Signed)
  Problem: Clinical Measurements: Goal: Ability to maintain clinical measurements within normal limits will improve Outcome: Progressing Goal: Diagnostic test results will improve Outcome: Progressing Goal: Respiratory complications will improve Outcome: Progressing Goal: Cardiovascular complication will be avoided Outcome: Progressing   Problem: Activity: Goal: Risk for activity intolerance will decrease Outcome: Progressing   Problem: Elimination: Goal: Will not experience complications related to urinary retention Outcome: Progressing   Problem: Safety: Goal: Ability to remain free from injury will improve Outcome: Progressing   Problem: Skin Integrity: Goal: Risk for impaired skin integrity will decrease Outcome: Progressing

## 2021-06-03 NOTE — Progress Notes (Signed)
Admit: 05/25/2021 LOS: 7  53M with acute ischemic CVA 10/10 and now with AoCKD3, renal US with b/l HN, and passing some clots in urine, suggestive of obstruction at the level of the bladder.  Subjective:  Urology note reviewed, appreciate the help Wife in room, updated  SCr 2.0, improved Urine dark amber at this time 1.8L UOP yesterday  10/18 0701 - 10/19 0700 In: 320 [P.O.:320] Out: 1863 [Urine:1863]  There were no vitals filed for this visit.  Scheduled Meds:  apixaban  5 mg Oral BID   atorvastatin  80 mg Oral Daily   brinzolamide  1 drop Both Eyes TID   And   brimonidine  1 drop Both Eyes TID   Chlorhexidine Gluconate Cloth  6 each Topical Daily   ciprofloxacin  500 mg Oral BID   feeding supplement  237 mL Oral TID BM   finasteride  5 mg Oral Daily   insulin aspart  0-9 Units Subcutaneous TID WC   latanoprost  1 drop Both Eyes QHS   metoprolol succinate  25 mg Oral Daily   mirtazapine  7.5 mg Oral QHS   multivitamin with minerals  1 tablet Oral Daily   polyethylene glycol  17 g Oral Daily   QUEtiapine  25 mg Oral QHS   rivastigmine  4.6 mg Transdermal Daily   senna  1 tablet Oral QHS   sodium chloride flush  3 mL Intravenous Q12H   tamsulosin  0.4 mg Oral Daily   Continuous Infusions: PRN Meds:.acetaminophen **OR** acetaminophen  Current Labs: reviewed    Physical Exam:  Blood pressure 104/75, pulse 93, temperature 97.6 F (36.4 C), temperature source Oral, resp. rate 14, SpO2 100 %. GEN: NAD, elderly mal resting calmly ENT: NCAT EYES: Eyes closed CV: RRR nl s1s2 PULM: CTAB ABD: s/nt/nd SKIN: no rashes/lesions EXT:No LEE  A AoCKD3, BL SCr 1.5; Renal US 10/17 with mild b/l HN and debris in bladder.  UA with gross hematuria.  Obstructive Severe bladder distension and likley UTI with obstructive features 06/02/21, per primary and Urology  Acute CVA 10/10 HTN, BPs ok DM2 Presumed BPH on finateride AFib on DOAC Dementia  P ABX per primary Urology  assisting with  bladder dysfunction No further suggestions GFR will stablize in time Will sign off for now.  Please call with any questions or concerns.  Pt does not need follow up with nephrology   Pearson Grippe MD 06/03/2021, 9:24 AM  Recent Labs  Lab 06/01/21 0355 06/02/21 0357 06/03/21 0359  NA 138 139 136  K 4.1 3.9 4.2  CL 105 109 106  CO2 23 20* 22  GLUCOSE 146* 180* 170*  BUN 38* 50* 50*  CREATININE 2.13* 2.16* 2.05*  CALCIUM 9.5 9.2 8.8*  PHOS  --   --  4.2   Recent Labs  Lab 06/01/21 0355 06/02/21 0357 06/03/21 0359  WBC 12.6* 11.8* 10.2  NEUTROABS 9.4* 8.7* 6.9  HGB 13.5 12.3* 11.5*  HCT 39.3 35.8* 35.0*  MCV 98.5 98.6 102.0*  PLT 190 197 191

## 2021-06-03 NOTE — Progress Notes (Addendum)
HD#7 Subjective:  Overnight Events: NAEON  No acute concerns voiced. Patient more alert today and speech more clear as well. Has wife at bedside and patient able to recognize her.   When asked patient about a bowel movement he stated he has not had one.    Objective:  Vital signs in last 24 hours: Vitals:   06/02/21 1945 06/02/21 1947 06/02/21 2338 06/03/21 0428  BP: (!) 146/91 (!) 146/91 129/84 112/79  Pulse: 92 92 97 86  Resp:  16 20 18   Temp: 98.8 F (37.1 C) 98.8 F (37.1 C) 98 F (36.7 C) 98.4 F (36.9 C)  TempSrc: Oral Oral    SpO2: 92% 92% 99% 97%   Supplemental O2: Room Air SpO2: 97 %   Physical Exam:   General: NAD Head: Normocephalic without scalp lesions.  Eyes: Conjunctivae pink, sclerae white, without jaundice.  Mouth: Lips normal color, without lesions. Moist mucus membrane. Neck: Neck supple with full range of motion (ROM). NLungs: CTAB, no wheeze, rhonchi or rales. Limited to anterior auscultation. Cardiovascular: Normal rate, irregular rhythm.  Abdomen: No TTP, patient did say palpation was uncomfortable but not painful.  MSK: No asymmetry or muscle atrophy. Grip strength tested and intact. Skin: warm, dry, good skin turgor, no lesions Neuro: Alert but not oriented. CN grossly intact Psych: Normal mood and normal affect    Intake/Output Summary (Last 24 hours) at 06/03/2021 0515 Last data filed at 06/03/2021 0429 Gross per 24 hour  Intake 560 ml  Output 1863 ml  Net -1303 ml    Net IO Since Admission: -1,295 mL [06/03/21 0515]  Pertinent Labs: CBC Latest Ref Rng & Units 06/03/2021 06/02/2021 06/01/2021  WBC 4.0 - 10.5 K/uL 10.2 11.8(H) 12.6(H)  Hemoglobin 13.0 - 17.0 g/dL 11.5(L) 12.3(L) 13.5  Hematocrit 39.0 - 52.0 % 35.0(L) 35.8(L) 39.3  Platelets 150 - 400 K/uL 191 197 190   CMP Latest Ref Rng & Units 06/02/2021 06/01/2021 05/30/2021  Glucose 70 - 99 mg/dL 180(H) 146(H) 122(H)  BUN 8 - 23 mg/dL 50(H) 38(H) 18  Creatinine 0.61 -  1.24 mg/dL 2.16(H) 2.13(H) 1.73(H)  Sodium 135 - 145 mmol/L 139 138 137  Potassium 3.5 - 5.1 mmol/L 3.9 4.1 3.5  Chloride 98 - 111 mmol/L 109 105 105  CO2 22 - 32 mmol/L 20(L) 23 21(L)  Calcium 8.9 - 10.3 mg/dL 9.2 9.5 9.6  Total Protein 6.5 - 8.1 g/dL - - -  Total Bilirubin 0.3 - 1.2 mg/dL - - -  Alkaline Phos 38 - 126 U/L - - -  AST 15 - 41 U/L - - -  ALT 0 - 44 U/L - - -    Imaging: No results found.  Assessment/Plan:   Principal Problem:   Acute CVA (cerebrovascular accident) ([place]) Active Problems:   TIA (transient ischemic attack)   Malnutrition of moderate degree  Patient Summary: [name] [name] is a 75 y.o. with a pertinent PMH of A. 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   -Entresto and spironolactone held due to soft pressures. -High intensity statin, atorvastatin 80 mg daily -Continue working on risk management (HTN, T2DM)   Acute delirium due to hospitalization with underlying dementia Patient showed significant improvement  in his mentation. He did not have mittens on when evaluated at bedside.  We will attempt to slowly remove these as it is safe per patient. Improved dysarthria.  - Seroquel 25 mg qhs --Remeron and rivastigmine qHS   AKI on [facility] Nephrology and urology consulted. AKI improving with Cr 2.05 from 2.16 with foley placement. Cipro 500 mg BID started for complicated UTI. Continue Cipro until cultures and sensitivities come back. Continue to follow recs by nephrology and urology.   Complicated UTI  UTI suggested by UA. Culture pending. Cipro 500 mg BID started for complicated UTI. Continue Cipro until cultures and sensitivities come back.   -Continue to follow recs by nephrology and urology.   Macrocytic anemia Patient has hgb that down-trended is currently 11.5. MCV was elevated and folic acid resulted low at 5.5. VB12 in normal range.  -Replete folic acid.  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  We will continue to monitor and continue metoprolol 25 mg.  Continue to hold spironolactone and Entresto. - Monitor volume status, euvolemic to mildly hypovolemic on exam 06/03/21. Continue to encourage PO intake as above. - Continue metoprolol at 25 mg  Hypokalemia, resolved K of 3.9 on 10/18 am.   -Continue to monitor  Leukocytosis (Resolved) Patient with leukocytosis of 11.8 which is improved from previous days. He has remained afebrile and without other signs or symptoms of acute infection.  -Daily CBCs to monitor leukocytosis  Diet:  dysphagia 3 IVF: None,None VTE: Eliquis Code: Full PT/OT recs: SNF for Subacute PT,. TOC recs: Pending Placement/needs further medical workup Family Update: Updated wife on 06/02/21, family at bedside.   Dispo: Anticipated discharge to Skilled nursing facility pending discontinuation of restraints and need for sedating medications.  [name], MD Tillie Rung. [facility] Internal Medicine Residency, PGY-1  [phone]

## 2021-06-04 DIAGNOSIS — I639 Cerebral infarction, unspecified: Secondary | ICD-10-CM | POA: Diagnosis not present

## 2021-06-04 LAB — RENAL FUNCTION PANEL
Albumin: 2.5 g/dL — ABNORMAL LOW (ref 3.5–5.0)
Anion gap: 12 (ref 5–15)
BUN: 52 mg/dL — ABNORMAL HIGH (ref 8–23)
CO2: 21 mmol/L — ABNORMAL LOW (ref 22–32)
Calcium: 9.1 mg/dL (ref 8.9–10.3)
Chloride: 108 mmol/L (ref 98–111)
Creatinine, Ser: 1.93 mg/dL — ABNORMAL HIGH (ref 0.61–1.24)
GFR, Estimated: 35 mL/min — ABNORMAL LOW (ref >60)
Glucose, Bld: 131 mg/dL — ABNORMAL HIGH (ref 70–99)
Phosphorus: 3.7 mg/dL (ref 2.5–4.6)
Potassium: 3.7 mmol/L (ref 3.5–5.1)
Sodium: 141 mmol/L (ref 135–145)

## 2021-06-04 LAB — CBC WITH DIFFERENTIAL/PLATELET
Abs Immature Granulocytes: 0.03 10*3/uL (ref 0.00–0.07)
Basophils Absolute: 0 10*3/uL (ref 0.0–0.1)
Basophils Relative: 0 %
Eosinophils Absolute: 0 10*3/uL (ref 0.0–0.5)
Eosinophils Relative: 0 %
HCT: 32.9 % — ABNORMAL LOW (ref 39.0–52.0)
Hemoglobin: 11 g/dL — ABNORMAL LOW (ref 13.0–17.0)
Immature Granulocytes: 0 %
Lymphocytes Relative: 30 %
Lymphs Abs: 2.8 10*3/uL (ref 0.7–4.0)
MCH: 33.7 pg (ref 26.0–34.0)
MCHC: 33.4 g/dL (ref 30.0–36.0)
MCV: 100.9 fL — ABNORMAL HIGH (ref 80.0–100.0)
Monocytes Absolute: 0.2 10*3/uL (ref 0.1–1.0)
Monocytes Relative: 2 %
Neutro Abs: 6.2 10*3/uL (ref 1.7–7.7)
Neutrophils Relative %: 68 %
Platelets: 203 10*3/uL (ref 150–400)
RBC: 3.26 MIL/uL — ABNORMAL LOW (ref 4.22–5.81)
RDW: 13.3 % (ref 11.5–15.5)
WBC: 9.3 10*3/uL (ref 4.0–10.5)
nRBC: 0 % (ref 0.0–0.2)

## 2021-06-04 LAB — GLUCOSE, CAPILLARY
Glucose-Capillary: 140 mg/dL — ABNORMAL HIGH (ref 70–99)
Glucose-Capillary: 228 mg/dL — ABNORMAL HIGH (ref 70–99)
Glucose-Capillary: 225 mg/dL — ABNORMAL HIGH (ref 70–99)
Glucose-Capillary: 130 mg/dL — ABNORMAL HIGH (ref 70–99)

## 2021-06-04 MED ORDER — SENNA 8.6 MG PO TABS
2.0000 | ORAL_TABLET | Freq: Every day | ORAL | Status: DC
  Filled 2021-06-04: qty 2

## 2021-06-04 MED ORDER — POTASSIUM CHLORIDE CRYS ER 20 MEQ PO TBCR
40.0000 meq | EXTENDED_RELEASE_TABLET | Freq: Once | ORAL | Status: AC
  Administered 2021-06-04: 40 meq via ORAL
  Filled 2021-06-04: qty 2

## 2021-06-04 MED ORDER — POLYETHYLENE GLYCOL 3350 17 G PO PACK
17.0000 g | PACK | Freq: Two times a day (BID) | ORAL | Status: DC
  Administered 2021-06-05: 17 g via ORAL
  Filled 2021-06-04 (×2): qty 1

## 2021-06-04 MED ORDER — BISACODYL 10 MG RE SUPP
10.0000 mg | Freq: Once | RECTAL | Status: AC
  Administered 2021-06-04: 10 mg via RECTAL
  Filled 2021-06-04: qty 1

## 2021-06-04 MED ORDER — ENSURE ENLIVE PO LIQD
1.0000 | Freq: Three times a day (TID) | ORAL | Status: DC
  Administered 2021-06-05 – 2021-06-30 (×58): 237 mL via ORAL

## 2021-06-04 NOTE — Plan of Care (Signed)
  Problem: Education: Goal: Knowledge of General Education information will improve Description: Including pain rating scale, medication(s)/side effects and non-pharmacologic comfort measures Outcome: Progressing   Problem: Health Behavior/Discharge Planning: Goal: Ability to manage health-related needs will improve Outcome: Progressing   Problem: Clinical Measurements: Goal: Ability to maintain clinical measurements within normal limits will improve Outcome: Progressing Goal: Will remain free from infection Outcome: Progressing Goal: Diagnostic test results will improve Outcome: Progressing Goal: Respiratory complications will improve Outcome: Progressing Goal: Cardiovascular complication will be avoided Outcome: Progressing   Problem: Nutrition: Goal: Adequate nutrition will be maintained Outcome: Progressing   Problem: Coping: Goal: Level of anxiety will decrease Outcome: Progressing   Problem: Skin Integrity: Goal: Risk for impaired skin integrity will decrease Outcome: Progressing   Problem: Safety: Goal: Non-violent Restraint(s) Outcome: Progressing
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
## 2021-06-04 NOTE — Progress Notes (Signed)
Physical Therapy Treatment Patient Details Name: [name] [name] MRN: [id number] DOB: 23-Dec-1945 Today's Date: 06/04/2021   History of Present Illness [name] is a 75 y/o male admitted with c/o left leg weakness. [name] has dementia with short-term memory deficits at baseline. 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 PMH: A. 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    PT Comments    Pt received in supine, alert and confused, participatory with encouragement and following ~25% of commands this date. Pt needs multimodal cues and increased time to initiate/perform tasks. Pt performed bed mobility and transfers with mod to maxA but unsafe standing and unable to follow instructions for gait progression this date. Emphasis on seated balance, reorientation to task/situation, benefits of mobility, safety with transfers. Pt continues to benefit from PT services to progress toward functional mobility goals.    Recommendations for follow up therapy are one component of a multi-disciplinary discharge planning process, led by the attending physician.  Recommendations may be updated based on patient status, additional functional criteria and insurance authorization.  Follow Up Recommendations  SNF;Supervision/Assistance - 24 hour     Equipment Recommendations  None recommended by PT    Recommendations for Other Services       Precautions / Restrictions Precautions Precautions: Fall Precaution Comments: decreased L awareness, likely Lewy Body dementia Restrictions Weight Bearing Restrictions: No     Mobility  Bed Mobility Overal bed mobility: Needs Assistance Bed Mobility: Supine to Sit;Sit to Supine     Supine to sit: HOB elevated;Mod assist;+2 for safety/equipment Sit to supine: +2 for safety/equipment;Mod assist   General bed mobility comments: increased assist today 2/2 confusion, pt needing  hand over hand assist to initiate but able to pull up on bed rail with time/cues    Transfers Overall transfer level: Needs assistance Equipment used: Rolling walker (2 wheeled) Transfers: Sit to/from Stand Sit to Stand: +2 safety/equipment;Max assist         General transfer comment: initated on his own with maxA to rise; pt with poor hand placement and L awareness needing increased time/vcs to place LUE on RW. Able to stand from lowest bed height but needs heavy lift assist.  Ambulation/Gait             General Gait Details: UTA; pt not following commands well enough to safely attempt, also c/o bowel urgency   Stairs             Wheelchair Mobility    Modified Rankin (Stroke Patients Only) Modified Rankin (Stroke Patients Only) Pre-Morbid Rankin Score: No significant disability Modified Rankin: Severe disability     Balance Overall balance assessment: Needs assistance Sitting-balance support: Feet supported Sitting balance-Leahy Scale: Fair Sitting balance - Comments: pt has righting reactions but impulsive and needing min guard to minA for safety at all times Postural control: Posterior lean;Left lateral lean Standing balance support: Bilateral upper extremity supported;During functional activity Standing balance-Leahy Scale: Poor Standing balance comment: min to modA with UE support for static balance, did not assess dynamic today 2/2 confusion                            Cognition Arousal/Alertness: Awake/alert Behavior During Therapy: Impulsive;Restless Overall Cognitive Status: Impaired/Different from baseline Area of Impairment: Orientation;Memory;Safety/judgement;Problem solving;Following commands;Attention;Awareness                 Orientation Level:  Disoriented to;Time;Situation;Place (states name/ DOB correctly then states "I'm 38[birth year]4") Current Attention Level: Focused Memory: Decreased short-term memory;Decreased recall of  precautions (pt pulling on catheter) Following Commands: Follows one step commands with increased time;Follows one step commands inconsistently Safety/Judgement: Decreased awareness of safety;Decreased awareness of deficits Awareness: Intellectual Problem Solving: Slow processing;Difficulty sequencing;Requires verbal cues;Requires tactile cues;Decreased initiation General Comments: Pt following ~25% of 1-step mobility commands with increased time/encouragement to initiate today tangential/distracted, thinking he is going to liquor store.      Exercises Other Exercises Other Exercises: supine BLE AAROM: heel slides, hip abduction x5-10 reps ea    General Comments General comments (skin integrity, edema, etc.): catheter bag appearing to contain dark red urine; RN aware      Pertinent Vitals/Pain Pain Assessment: Faces Faces Pain Scale: Hurts a little bit Pain Location: some grimacing with movement, no specific c/o pain, noted red color to urine Pain Intervention(s): Limited activity within patient's tolerance;Monitored during session;Repositioned    Home Living                      Prior Function            PT Goals (current goals can now be found in the care plan section) Acute Rehab PT Goals Patient Stated Goal: to sit up PT Goal Formulation: Patient unable to participate in goal setting Time For Goal Achievement: 06/09/21 Progress towards PT goals: Progressing toward goals    Frequency    Min 3X/week      PT Plan Current plan remains appropriate    Co-evaluation              AM-PAC PT "6 Clicks" Mobility   Outcome Measure  Help needed turning from your back to your side while in a flat bed without using bedrails?: A Little Help needed moving from lying on your back to sitting on the side of a flat bed without using bedrails?: A Lot Help needed moving to and from a bed to a chair (including a wheelchair)?: A Lot Help needed standing up from a chair  using your arms (e.g., wheelchair or bedside chair)?: A Lot Help needed to walk in hospital room?: Total Help needed climbing 3-5 steps with a railing? : Total 6 Click Score: 11    End of Session Equipment Utilized During Treatment: Gait belt Activity Tolerance: Patient tolerated treatment well;Other (comment) (cognition limiting progression) Patient left: in bed;with call bell/phone within reach;with bed alarm set;with restraints reapplied Nurse Communication: Mobility status;Need for lift equipment (may need mitts? intermittently pulling on catheter tubing) PT Visit Diagnosis: Other abnormalities of gait and mobility (R26.89);Hemiplegia and hemiparesis;Muscle weakness (generalized) (M62.81) Hemiplegia - Right/Left: Left Hemiplegia - dominant/non-dominant: Non-dominant Hemiplegia - caused by: Cerebral infarction     Time: 4431-5400 PT Time Calculation (min) (ACUTE ONLY): 21 min  Charges:  $Therapeutic Activity: 8-22 mins                     Jahking Lesser P., PTA Acute Rehabilitation Services Pager: [phone] Office: [place] 06/04/2021, 1:11 PM

## 2021-06-04 NOTE — Progress Notes (Addendum)
HD#8 Subjective:  Overnight Events: NAEON  Patient denying any acute concerns. He denies having any bowel movements and the nursing staff confirm this. Denies any abdominal pain. He appeared slightly more confused compared to yesterday but still improved from before. His wife was not present at bedside this morning.    Objective:  Vital signs in last 24 hours: Vitals:   06/03/21 1152 06/03/21 1618 06/03/21 2043 06/04/21 0347  BP: 99/68 101/71 127/78 119/81  Pulse: 68 93 (!) 52 99  Resp: 16 18 20 17   Temp: 97.7 F (36.5 C) 97.6 F (36.4 C) 98.1 F (36.7 C) 98.3 F (36.8 C)  TempSrc: Oral Oral Oral Oral  SpO2: 100% 99%     Supplemental O2: Room Air SpO2: 99 %   Physical Exam:   Physical Exam General: NAD Head: Normocephalic without scalp lesions.  Mouth: Moist mucus membrane. Neck: Neck supple with full range of motion (ROM).  Lungs: CTAB, no wheeze, rhonchi or rales. Limited to anterior ausculation. Cardiovascular: regular, irregular rhythm.  Abdomen: Soft, non-distended, not TTP; no flank tenderness to palpation MSK: No asymmetry or muscle atrophy. No LE edema Skin: warm, dry, no lesions Neuro: Alert but oriented to person but not place, time or situation. CN grossly intact. Moving all extremities. Dysarthria slightly improved. Psych: Normal mood and normal affect    Intake/Output Summary (Last 24 hours) at 06/04/2021 0520 Last data filed at 06/03/2021 2115 Gross per 24 hour  Intake 726 ml  Output 700 ml  Net 26 ml    Net IO Since Admission: -1,269 mL [06/04/21 0520]  Pertinent Labs: CBC Latest Ref Rng & Units 06/04/2021 06/03/2021 06/02/2021  WBC 4.0 - 10.5 K/uL 9.3 10.2 11.8(H)  Hemoglobin 13.0 - 17.0 g/dL 11.0(L) 11.5(L) 12.3(L)  Hematocrit 39.0 - 52.0 % 32.9(L) 35.0(L) 35.8(L)  Platelets 150 - 400 K/uL 203 191 197   CMP Latest Ref Rng & Units 06/04/2021 06/03/2021 06/02/2021  Glucose 70 - 99 mg/dL 131(H) 170(H) 180(H)  BUN 8 - 23 mg/dL 52(H)  50(H) 50(H)  Creatinine 0.61 - 1.24 mg/dL 1.93(H) 2.05(H) 2.16(H)  Sodium 135 - 145 mmol/L 141 136 139  Potassium 3.5 - 5.1 mmol/L 3.7 4.2 3.9  Chloride 98 - 111 mmol/L 108 106 109  CO2 22 - 32 mmol/L 21(L) 22 20(L)  Calcium 8.9 - 10.3 mg/dL 9.1 8.8(L) 9.2  Total Protein 6.5 - 8.1 g/dL - - -  Total Bilirubin 0.3 - 1.2 mg/dL - - -  Alkaline Phos 38 - 126 U/L - - -  AST 15 - 41 U/L - - -  ALT 0 - 44 U/L - - -    Imaging: No results found.  Assessment/Plan:   Principal Problem:   Acute CVA (cerebrovascular accident) ([place]) Active Problems:   TIA (transient ischemic attack)   Malnutrition of moderate degree  Patient Summary: [name] [name] is a 75 y.o. with a pertinent PMH of A. Fib on Eliquis, HFrEF (EF 25% 05/26/21)  2/2 dilated cardiomyopathy, HTN, T2DM, and dementia who presents to the ED with c/o left leg weakness and admitted for acute lacunar CVA on hospital day 10.   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   -Entresto and spironolactone held due to soft pressures and low PO intake. -High intensity statin, atorvastatin 80 mg daily -Continue working on risk management (HTN, T2DM)   Acute delirium due to hospitalization with underlying dementia, UTI, and urinary obstruction  Improvement in mentation following relief of urinary obstruction and treatment for UTI. Continues to be disoriented and waxing/waning delirium. He did not have mittens on when evaluated at bedside.  We will attempt to slowly remove these as it is safe per patient. Improved dysarthria. More improvement noted when wife was at bedside.  - Seroquel 25 mg qhs --Remeron and rivastigmine qHS   AKI on [facility] Nephrology and urology consulted. AKI improving with Cr 1.93 from 2.16 with foley placement. Cipro 500 mg BID started for complicated UTI. Day 3/7. Continue Cipro. Continue to follow recs by  urology.   Complicated UTI  UTI suggested by UA. Culture pending. Cipro 500 mg BID started for complicated UTI. Cultures show mixed growth. Urine still blood tinged.  -Continue to follow recs by urology.   Constipation Patient is constipated and does not have a recorded bowel movement in the chart. He is on both Senna 1 tablet nightly and on Miralax 17 g daily since 06/02/21. He did not produce a bowel movement but has been passing gas. Bowel sounds are present. DRE was performed that showed soft stool in the rectum with no impaction noted.  -Dulcolax suppository ordered -Continue Miralax and Senna -Continue to monitor closely  Macrocytic anemia Patient has hgb that down-trended is currently 11.0. MCV was elevated and folic acid resulted low at 5.5. VB12 in normal range.  -Continue  folic acid.  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  We will continue to monitor and continue metoprolol 25 mg.  Continue to hold spironolactone and Entresto. - Monitor volume status, euvolemic to mildly hypovolemic on exam 06/04/21. Continue to encourage PO intake as above. - Continue metoprolol at 25 mg  Hypokalemia, resolved K of 3.7 on 10/20 am.   -Continue to monitor  Leukocytosis (Resolved) Patient leukocyte count is improving with most recent one being 9.3. He has remained afebrile and without other signs or symptoms of acute infection.  -Daily CBCs to monitor leukocytosis  Diet:  dysphagia 3 IVF: None,None VTE: Eliquis Code: Full PT/OT recs: SNF for Subacute PT,. TOC recs: Pending Placement/needs further medical workup Family Update: Updated wife on 06/02/21, family at bedside.   Dispo: Anticipated discharge to Skilled nursing facility pending discontinuation of restraints and need for sedating medications.  [name], MD Tillie Rung. [facility] Internal Medicine Residency, PGY-1  [phone]

## 2021-06-05 DIAGNOSIS — I639 Cerebral infarction, unspecified: Secondary | ICD-10-CM | POA: Diagnosis not present

## 2021-06-05 LAB — RENAL FUNCTION PANEL
Albumin: 2.9 g/dL — ABNORMAL LOW (ref 3.5–5.0)
Anion gap: 10 (ref 5–15)
BUN: 58 mg/dL — ABNORMAL HIGH (ref 8–23)
CO2: 20 mmol/L — ABNORMAL LOW (ref 22–32)
Calcium: 9.3 mg/dL (ref 8.9–10.3)
Chloride: 111 mmol/L (ref 98–111)
Creatinine, Ser: 1.69 mg/dL — ABNORMAL HIGH (ref 0.61–1.24)
GFR, Estimated: 42 mL/min — ABNORMAL LOW (ref >60)
Glucose, Bld: 202 mg/dL — ABNORMAL HIGH (ref 70–99)
Phosphorus: 3 mg/dL (ref 2.5–4.6)
Potassium: 4.5 mmol/L (ref 3.5–5.1)
Sodium: 141 mmol/L (ref 135–145)

## 2021-06-05 LAB — CBC WITH DIFFERENTIAL/PLATELET
Abs Immature Granulocytes: 0.04 10*3/uL (ref 0.00–0.07)
Basophils Absolute: 0 10*3/uL (ref 0.0–0.1)
Basophils Relative: 0 %
Eosinophils Absolute: 0 10*3/uL (ref 0.0–0.5)
Eosinophils Relative: 0 %
HCT: 34.9 % — ABNORMAL LOW (ref 39.0–52.0)
Hemoglobin: 11.6 g/dL — ABNORMAL LOW (ref 13.0–17.0)
Immature Granulocytes: 0 %
Lymphocytes Relative: 29 %
Lymphs Abs: 3 10*3/uL (ref 0.7–4.0)
MCH: 33.8 pg (ref 26.0–34.0)
MCHC: 33.2 g/dL (ref 30.0–36.0)
MCV: 101.7 fL — ABNORMAL HIGH (ref 80.0–100.0)
Monocytes Absolute: 0.2 10*3/uL (ref 0.1–1.0)
Monocytes Relative: 2 %
Neutro Abs: 7.3 10*3/uL (ref 1.7–7.7)
Neutrophils Relative %: 69 %
Platelets: 210 10*3/uL (ref 150–400)
RBC: 3.43 MIL/uL — ABNORMAL LOW (ref 4.22–5.81)
RDW: 13.6 % (ref 11.5–15.5)
WBC: 10.5 10*3/uL (ref 4.0–10.5)
nRBC: 0 % (ref 0.0–0.2)

## 2021-06-05 LAB — GLUCOSE, CAPILLARY
Glucose-Capillary: 176 mg/dL — ABNORMAL HIGH (ref 70–99)
Glucose-Capillary: 187 mg/dL — ABNORMAL HIGH (ref 70–99)
Glucose-Capillary: 234 mg/dL — ABNORMAL HIGH (ref 70–99)
Glucose-Capillary: 154 mg/dL — ABNORMAL HIGH (ref 70–99)

## 2021-06-05 MED ORDER — SENNA 8.6 MG PO TABS: 1.0000 | ORAL_TABLET | Freq: Every day | ORAL | Status: DC

## 2021-06-05 MED ORDER — POLYETHYLENE GLYCOL 3350 17 G PO PACK: 17.0000 g | PACK | Freq: Once | ORAL | Status: DC

## 2021-06-05 NOTE — Progress Notes (Signed)
HD#9 Subjective:  Overnight Events: NAEON; Had 5 BM recorded overnight.   Denied any acute concerns. Was able to recall his name but not oriented to place, time or situation. He endorsed leg pain but then denied it on repeat questioning.     Objective:  Vital signs in last 24 hours: Vitals:   06/04/21 1525 06/04/21 2022 06/04/21 2335 06/05/21 0400  BP: 128/71 119/90 128/85   Pulse: 73 (!) 112 (!) 45   Resp: 17 20 19    Temp: 98.1 F (36.7 C) 97.8 F (36.6 C) 98.3 F (36.8 C)   TempSrc: Oral Oral    SpO2: 100% 96% 100%   Weight:    82.2 kg   Supplemental O2: Room Air SpO2: 100 %   Physical Exam:   Physical Exam General: NAD Head: Normocephalic without scalp lesions.   Mouth: Moist mucus membrane. Neck: Neck supple with full range of motion (ROM).   Lungs: CTAB, no wheeze, rhonchi or rales.  Cardiovascular: Regular rate, irregular rhythm Abdomen: No TTP, normal bowel sounds MSK: No asymmetry or muscle atrophy. Full range of motion (ROM) of all joints.   Skin: warm, dry, good skin turgor, no lesions Neuro: Alert and oriented. CN grossly intact Psych: Normal mood and normal affect    Intake/Output Summary (Last 24 hours) at 06/05/2021 0628 Last data filed at 06/04/2021 2130 Gross per 24 hour  Intake 237 ml  Output 1400 ml  Net -1163 ml    Net IO Since Admission: -2,432 mL [06/05/21 0628]  Pertinent Labs: CBC Latest Ref Rng & Units 06/05/2021 06/04/2021 06/03/2021  WBC 4.0 - 10.5 K/uL 10.5 9.3 10.2  Hemoglobin 13.0 - 17.0 g/dL 11.6(L) 11.0(L) 11.5(L)  Hematocrit 39.0 - 52.0 % 34.9(L) 32.9(L) 35.0(L)  Platelets 150 - 400 K/uL 210 203 191   CMP Latest Ref Rng & Units 06/05/2021 06/04/2021 06/03/2021  Glucose 70 - 99 mg/dL 202(H) 131(H) 170(H)  BUN 8 - 23 mg/dL 58(H) 52(H) 50(H)  Creatinine 0.61 - 1.24 mg/dL 1.69(H) 1.93(H) 2.05(H)  Sodium 135 - 145 mmol/L 141 141 136  Potassium 3.5 - 5.1 mmol/L 4.5 3.7 4.2  Chloride 98 - 111 mmol/L 111 108 106  CO2 22  - 32 mmol/L 20(L) 21(L) 22  Calcium 8.9 - 10.3 mg/dL 9.3 9.1 8.8(L)  Total Protein 6.5 - 8.1 g/dL - - -  Total Bilirubin 0.3 - 1.2 mg/dL - - -  Alkaline Phos 38 - 126 U/L - - -  AST 15 - 41 U/L - - -  ALT 0 - 44 U/L - - -    Imaging: No results found.  Assessment/Plan:   Principal Problem:   Acute CVA (cerebrovascular accident) ([place]) Active Problems:   TIA (transient ischemic attack)   Malnutrition of moderate degree  Patient Summary: [name] [name] is a 75 y.o. with a pertinent PMH of A. 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   -Entresto and spironolactone held due to soft pressures and low PO intake. -High intensity statin, atorvastatin 80 mg daily -Continue working on risk management (HTN, T2DM)   Acute delirium due to hospitalization with underlying dementia, UTI, and urinary obstruction Improvement in mentation following relief of urinary obstruction and treatment for UTI. Continues to be  disoriented and waxing/waning delirium. He did not have mittens on when evaluated at bedside.  We will attempt to slowly remove these as it is safe per patient. Improved dysarthria. More improvement noted when wife was at bedside.  - Seroquel 25 mg qhs --Remeron and rivastigmine qHS   AKI on [facility] Nephrology and urology consulted. AKI improving with Cr 1.69 from 1.93 with foley placement. Cipro 500 mg BID started for complicated UTI. Day 4/7. Continue Cipro. Continue to follow recs by urology.   Complicated UTI  UTI suggested by UA. Cipro 500 mg BID started for complicated UTI. Cultures show mixed growth. Urine still blood tinged. Day 4/7. -Continue to follow recs by urology.   -Will reach out to  urology for any additional recs   Atrial Fibrillation Chronic -Continue Eliquis  Constipation (resolving) Patient is constipated and does not have a recorded bowel movement in the chart. He is on both Senna 1 tablet nightly and on Miralax 17 g daily since 06/02/21. He did not produce a bowel movement but has been passing gas. Bowel sounds are present. DRE was performed that showed soft stool in the rectum with no impaction noted. Multiples bowel movements recorded.  -Continue Miralax and Senna but reduced dosing.  -Continue to monitor  Macrocytic anemia Patient has hgb that down-trended is currently 11.6. MCV was elevated and folic acid resulted low at 5.5. VB12 in normal range.  -Continue folic acid.  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  We will continue to monitor and continue metoprolol 25 mg.  Continue to hold spironolactone and Entresto. - Monitor volume status, euvolemic to mildly hypovolemic on exam 06/05/21. Continue to encourage PO intake as above. - Continue metoprolol at 25 mg  Hypokalemia, resolved K of 4.5 on 10/21 am.   -Continue to monitor  Leukocytosis (Resolved) Patient leukocytosis resolved with most recent one being 10.5. He has remained afebrile and without other signs or symptoms of acute infection.  -Daily CBCs to monitor leukocytosis  Diet:  dysphagia 3 IVF: None,None VTE: Eliquis Code: Full PT/OT recs: SNF for Subacute PT,. TOC recs: Pending Placement/needs further medical workup Family Update: Updated wife on 06/02/21, family at bedside.   Dispo: Anticipated discharge to Skilled nursing facility pending discontinuation of restraints and need for sedating medications.  [name], MD [facility] Internal Medicine Residency, PGY-1  [phone] On weekends and after 5 pm call the on-call pager [phone].

## 2021-06-05 NOTE — Progress Notes (Signed)
Urology Inpatient Progress Report  Hypokalemia [E87.6] Hypomagnesemia [E83.42] Transient ischemic attack (TIA) [G45.9] Stroke, acute, embolic ([place]) [H60.7] TIA (transient ischemic attack) [G45.9]  Intv/Subj: No acute events overnight. Patient is without complaint. Seems to be improving, mentally Urine remains dark [place].  Principal Problem:   Acute CVA (cerebrovascular accident) ([place]) Active Problems:   TIA (transient ischemic attack)   Malnutrition of moderate degree  Current Facility-Administered Medications  Medication Dose Route Frequency Provider Last Rate Last Admin   acetaminophen (TYLENOL) tablet 650 mg  650 mg Oral Q6H PRN Jose Persia, MD   650 mg at 05/31/21 1017   Or   acetaminophen (TYLENOL) suppository 650 mg  650 mg Rectal Q6H PRN Jose Persia, MD       apixaban (ELIQUIS) tablet 5 mg  5 mg Oral BID Jose Persia, MD   5 mg at 06/05/21 0827   atorvastatin (LIPITOR) tablet 80 mg  80 mg Oral Daily Jose Persia, MD   80 mg at 06/05/21 0827   brinzolamide (AZOPT) 1 % ophthalmic suspension 1 drop  1 drop Both Eyes TID Jose Persia, MD   1 drop at 06/05/21 3710   And   brimonidine (ALPHAGAN) 0.2 % ophthalmic solution 1 drop  1 drop Both Eyes TID Jose Persia, MD   1 drop at 06/05/21 6269   Chlorhexidine Gluconate Cloth 2 % PADS 6 each  6 each Topical Daily Charise Killian, MD   6 each at 06/05/21 4854   ciprofloxacin (CIPRO) tablet 500 mg  500 mg Oral BID Ardis Hughs, MD   500 mg at 06/05/21 0827   feeding supplement (ENSURE ENLIVE / ENSURE PLUS) liquid 237 mL  1 Bottle Oral TID BM [name], MD   237 mL at 06/05/21 0825   finasteride (PROSCAR) tablet 5 mg  5 mg Oral Daily Jose Persia, MD   5 mg at 62/70/35 0093   folic acid (FOLVITE) tablet 1 mg  1 mg Oral Daily [name], MD   1 mg at 06/05/21 0827   insulin aspart (novoLOG) injection 0-9 Units  0-9 Units Subcutaneous TID WC Jose Persia, MD   2 Units at 06/05/21 0647   latanoprost  (XALATAN) 0.005 % ophthalmic solution 1 drop  1 drop Both Eyes QHS Jose Persia, MD   1 drop at 06/03/21 2121   metoprolol succinate (TOPROL-XL) 24 hr tablet 25 mg  25 mg Oral Daily Jose Persia, MD   25 mg at 06/05/21 0827   mirtazapine (REMERON) tablet 7.5 mg  7.5 mg Oral QHS Christian, Rylee, MD   7.5 mg at 06/03/21 2120   multivitamin with minerals tablet 1 tablet  1 tablet Oral Daily Charise Killian, MD   1 tablet at 06/05/21 0826   QUEtiapine (SEROQUEL) tablet 25 mg  25 mg Oral QHS Jose Persia, MD   25 mg at 06/03/21 2121   rivastigmine (EXELON) 4.6 mg/24hr 4.6 mg  4.6 mg Transdermal Daily Christian, Rylee, MD   4.6 mg at 06/05/21 0826   tamsulosin (FLOMAX) capsule 0.4 mg  0.4 mg Oral Daily Jose Persia, MD   0.4 mg at 06/05/21 0826     Objective: Vital: Vitals:   06/04/21 2022 06/04/21 2335 06/05/21 0400 06/05/21 0801  BP: 119/90 128/85  113/86  Pulse: (!) 112 (!) 45  (!) 51  Resp: 20 19  20   Temp: 97.8 F (36.6 C) 98.3 F (36.8 C)    TempSrc: Oral     SpO2: 96% 100%  100%  Weight:  82.2 kg    I/Os: I/O last 3 completed shifts: In: 252 [P.O.:252] Out: 1400 [Urine:1400]  Physical Exam:  General: Patient is in no apparent distress Lungs: Normal respiratory effort, chest expands symmetrically. GI:  The abdomen is soft and nontender without mass. Foley: dark [place]  Ext: lower extremities symmetric  Lab Results: Recent Labs    06/03/21 0359 06/04/21 0234 06/05/21 0500  WBC 10.2 9.3 10.5  HGB 11.5* 11.0* 11.6*  HCT 35.0* 32.9* 34.9*   Recent Labs    06/03/21 0359 06/04/21 0234 06/05/21 0500  NA 136 141 141  K 4.2 3.7 4.5  CL 106 108 111  CO2 22 21* 20*  GLUCOSE 170* 131* 202*  BUN 50* 52* 58*  CREATININE 2.05* 1.93* 1.69*  CALCIUM 8.8* 9.1 9.3   No results for input(s): LABPT, INR in the last 72 hours. No results for input(s): LABURIN in the last 72 hours. Results for orders placed or performed during the hospital encounter of 05/25/21  Resp  Panel by RT-PCR (Flu A&B, Covid) Nasopharyngeal Swab     Status: None   Collection Time: 05/25/21  1:08 PM   Specimen: Nasopharyngeal Swab; Nasopharyngeal(NP) swabs in vial transport medium  Result Value Ref Range Status   SARS Coronavirus 2 by RT PCR NEGATIVE NEGATIVE Final    Comment: (NOTE) SARS-CoV-2 target nucleic acids are NOT DETECTED.  The SARS-CoV-2 RNA is generally detectable in upper respiratory specimens during the acute phase of infection. The lowest concentration of SARS-CoV-2 viral copies this assay can detect is 138 copies/mL. A negative result does not preclude SARS-Cov-2 infection and should not be used as the sole basis for treatment or other patient management decisions. A negative result may occur with  improper specimen collection/handling, submission of specimen other than nasopharyngeal swab, presence of viral mutation(s) within the areas targeted by this assay, and inadequate number of viral copies(<138 copies/mL). A negative result must be combined with clinical observations, patient history, and epidemiological information. The expected result is Negative.  Fact Sheet for Patients:  EntrepreneurPulse.com.au  Fact Sheet for Healthcare Providers:  IncredibleEmployment.be  This test is no t yet approved or cleared by the [place] FDA and  has been authorized for detection and/or diagnosis of SARS-CoV-2 by FDA under an Emergency Use Authorization (EUA). This EUA will remain  in effect (meaning this test can be used) for the duration of the COVID-19 declaration under Section 564(b)(1) of the Act, 21 U.S.C.section 360bbb-3(b)(1), unless the authorization is terminated  or revoked sooner.       Influenza A by PCR NEGATIVE NEGATIVE Final   Influenza B by PCR NEGATIVE NEGATIVE Final    Comment: (NOTE) The Xpert Xpress SARS-CoV-2/FLU/RSV plus assay is intended as an aid in the diagnosis of influenza from Nasopharyngeal  swab specimens and should not be used as a sole basis for treatment. Nasal washings and aspirates are unacceptable for Xpert Xpress SARS-CoV-2/FLU/RSV testing.  Fact Sheet for Patients: EntrepreneurPulse.com.au  Fact Sheet for Healthcare Providers: IncredibleEmployment.be  This test is not yet approved or cleared by the [place] FDA and has been authorized for detection and/or diagnosis of SARS-CoV-2 by FDA under an Emergency Use Authorization (EUA). This EUA will remain in effect (meaning this test can be used) for the duration of the COVID-19 declaration under Section 564(b)(1) of the Act, 21 U.S.C. section 360bbb-3(b)(1), unless the authorization is terminated or revoked.  Performed at [facility], [place] [address] [place], [place] [address]   Urine Culture  Status: Abnormal   Collection Time: 06/02/21  4:03 PM   Specimen: Urine, Catheterized  Result Value Ref Range Status   Specimen Description URINE, CATHETERIZED  Final   Special Requests   Final    NONE Performed at [facility], [address] [place], [place] [address]    Culture MULTIPLE SPECIES PRESENT, SUGGEST RECOLLECTION (A)  Final   Report Status 06/03/2021 FINAL  Final    Studies/Results: No results found.  Assessment: 87M with stroke and urinary retention.  Doing better.  Seems to be mentally close to his baseline.  Urine consistent with old blood.  Urine culture results as multi-flora.  Plan: Would recommend a voiding trial after 7 days with a catheter. Would keep him on abx until after he passes his voiding trial and we are sure that he's not going to need the catheter replaced.  He's at risk for recurrent infection if the catheter comes out and he's not emptying completely.  Would be good to keep abx for a few days past catheter removal.  Will f/u with the patient in clinic, once he's out of the SNF and can make it to clinic.  Please page urology  for any additional questions.   Louis Meckel, MD Urology 06/05/2021, 1:28 PM

## 2021-06-05 NOTE — Progress Notes (Signed)
Occupational Therapy Treatment Patient Details Name: [name] [name] MRN: [id number] DOB: [birth year] Today's Date: 06/05/2021   History of present illness [name] is a 75 y/o male admitted with c/o left leg weakness. [name] has dementia with short-term memory deficits at baseline. 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 PMH: A. 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   OT comments  Pt. Seen with PT for skilled therapy session.  Pt. Able to complete bed mobility, lb dressing task, and several sit/stand in preparation for increasing mobility and adls.  Pt. Requires max continuous cues for attention and completion of task.  Noted to have most distractions when talking.  Noted to do well with familiar tasks ie: donning socks and self feeding with limited distractions.  Current recommendations remain appropriate.     Recommendations for follow up therapy are one component of a multi-disciplinary discharge planning process, led by the attending physician.  Recommendations may be updated based on patient status, additional functional criteria and insurance authorization.    Follow Up Recommendations  SNF;Supervision/Assistance - 24 hour    Equipment Recommendations  None recommended by OT    Recommendations for Other Services      Precautions / Restrictions Precautions Precautions: Fall Precaution Comments: decreased L awareness, likely Lewy Body dementia       Mobility Bed Mobility Overal bed mobility: Needs Assistance Bed Mobility: Supine to Sit;Sit to Supine Rolling: Min assist   Supine to sit: Min assist;+2 for safety/equipment Sit to supine: Min assist;+2 for safety/equipment        Transfers                      Balance                                           ADL either performed or assessed with clinical judgement   ADL Overall ADL's : Needs  assistance/impaired Eating/Feeding: Supervision/ safety;Set up;Minimal assistance;Sitting;Bed level Eating/Feeding Details (indicate cue type and reason): set up, pt. limited by conversation. once therapists were leaving the room pt. able to engage in self feeding without cues                 Lower Body Dressing: Minimal assistance;Sitting/lateral leans Lower Body Dressing Details (indicate cue type and reason): pt. demonstrated habits likely of home wtih bending forward to reach r foot to don, able to cross l oer right to don but unable to determine if physcial limitations or distracted with conversation.  talking was a great inhibitor to task completion throughout session Toilet Transfer: Moderate assistance;+2 for physical assistance;+2 for safety/equipment Toilet Transfer Details (indicate cue type and reason): simulated from eob, pt. able to stand and sit x3 some forward steps. would sit without warning or reason provided           General ADL Comments: pt. able to physically perform bed mobility, some lb dressing, sit/stand.  max cues for task completion and redirection. when not talking was able to achieve desired task but unable to sustain not talking and then task would stop     Vision       Perception     Praxis      Cognition Arousal/Alertness: Awake/alert Behavior During Therapy: WFL for tasks assessed/performed Overall Cognitive Status: Impaired/Different from baseline  Area of Impairment: Orientation;Memory;Safety/judgement;Problem solving;Following commands;Attention;Awareness                 Orientation Level: Disoriented to;Time;Situation;Place Current Attention Level: Focused Memory: Decreased short-term memory;Decreased recall of precautions Following Commands: Follows one step commands with increased time;Follows one step commands inconsistently Safety/Judgement: Decreased awareness of safety;Decreased awareness of deficits Awareness:  Intellectual Problem Solving: Slow processing;Difficulty sequencing;Requires verbal cues;Requires tactile cues;Decreased initiation          Exercises     Shoulder Instructions       General Comments  Enjoyed [place] and [place].  Likes to say "kackalacky" :)     Pertinent Vitals/ Pain          Home Living                                          Prior Functioning/Environment              Frequency  Min 2X/week        Progress Toward Goals  OT Goals(current goals can now be found in the care plan section)  Progress towards OT goals: Progressing toward goals     Plan Discharge plan remains appropriate;Frequency remains appropriate    Co-evaluation    PT/OT/SLP Co-Evaluation/Treatment: Yes Reason for Co-Treatment: For patient/therapist safety;To address functional/ADL transfers   OT goals addressed during session: ADL's and self-care      AM-PAC OT "6 Clicks" Daily Activity     Outcome Measure   Help from another person eating meals?: A Little Help from another person taking care of personal grooming?: A Little Help from another person toileting, which includes using toliet, bedpan, or urinal?: A Lot Help from another person bathing (including washing, rinsing, drying)?: A Lot Help from another person to put on and taking off regular upper body clothing?: A Little Help from another person to put on and taking off regular lower body clothing?: A Lot 6 Click Score: 15    End of Session Equipment Utilized During Treatment: Gait belt;Rolling walker  OT Visit Diagnosis: Unsteadiness on feet (R26.81);Other abnormalities of gait and mobility (R26.89);Muscle weakness (generalized) (M62.81);Repeated falls (R29.6);History of falling (Z91.81);Pain   Activity Tolerance Patient tolerated treatment well   Patient Left in bed;with bed alarm set;with restraints reapplied   Nurse Communication Other (comment) (alerted nursing  staff, mitton restraints off as pt. was eating)        Time: 1610-9604 OT Time Calculation (min): 23 min  Charges: OT General Charges $OT Visit: 1 Visit OT Treatments $Self Care/Home Management : 8-22 mins  Sonia Baller, COTA/L Acute Rehabilitation [phone]   Tanya Nones 06/05/2021, 1:36 PM

## 2021-06-05 NOTE — TOC Progression Note (Signed)
Transition of Care [facility]) - Progression Note    Patient Details  Name: [name] [name] MRN: [id number] Date of Birth: Jul 04, 1946  [facility] CM/SW [place], [place] Phone Number: 06/05/2021, 12:00 PM  Clinical Narrative:   CSW continuing to follow for discharge to SNF once medically stable.    Expected Discharge Plan: [place] Barriers to Discharge: Continued Medical Work up, Requiring sitter/restraints  Expected Discharge Plan and Services Expected Discharge Plan: [place] Choice: [place] arrangements for the past 2 months: Single Family Home                                       Social Determinants of Health (SDOH) Interventions    Readmission Risk Interventions No flowsheet data found.

## 2021-06-05 NOTE — Progress Notes (Addendum)
Bed alarm going off. Found pt over the siderail with feet on the floor. Posey belt and foley catheter taught as they both were connected on the other side of the bed. Pt placed back in bed and cleaned up from a BM.  Pt is also hallucinating. He's talking to his brother who he says is by the door. He's telling him to get over here so they can leave. Speech is clearing up some.

## 2021-06-05 NOTE — Plan of Care (Signed)

## 2021-06-05 NOTE — Progress Notes (Signed)
Physical Therapy Treatment Patient Details Name: [name] [name] MRN: [id number] DOB: Dec 31, 1945 Today's Date: 06/05/2021   History of Present Illness [name] is a 75 y/o male admitted 05/25/21 with c/o left leg weakness. [name] has dementia with short-term memory deficits at baseline. 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 PMH: A. 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    PT Comments    Co-session with OTA focusing on mobility and standing EOB.  Did much better with RW, however, remains impulsive, confabulatory and significant safety/fall risk.  PT will continue to follow acutely for safe mobility progression.   Recommendations for follow up therapy are one component of a multi-disciplinary discharge planning process, led by the attending physician.  Recommendations may be updated based on patient status, additional functional criteria and insurance authorization.  Follow Up Recommendations  SNF     Equipment Recommendations  None recommended by PT    Recommendations for Other Services       Precautions / Restrictions Precautions Precautions: Fall Precaution Comments: decreased L awareness, likely Lewy Body dementia     Mobility  Bed Mobility Overal bed mobility: Needs Assistance Bed Mobility: Supine to Sit;Sit to Supine Rolling: Min assist   Supine to sit: Min assist;+2 for safety/equipment Sit to supine: Min assist;+2 for safety/equipment   General bed mobility comments: Pt impulsive, posey belt and mittens removed for treatment session.    Transfers Overall transfer level: Needs assistance Equipment used: 2 person hand held assist;Rolling walker (2 wheeled) Transfers: Sit to/from Stand Sit to Stand: +2 safety/equipment;Mod assist;Min assist         General transfer comment: Pt able to stand with two person mod assist EOB, better with RW (two person min assis).  Pt  repeated sit to stand for strengthening and training.  Ambulation/Gait Ambulation/Gait assistance: +2 safety/equipment;Min assist Gait Distance (Feet): 3 Feet Assistive device: Rolling walker (2 wheeled) Gait Pattern/deviations: Shuffle;Decreased stride length;Step-to pattern     General Gait Details: side step to [facility], pt sits without warning, so did not feel safe progressing away from the bed today even with two person assist.   Stairs             Wheelchair Mobility    Modified Rankin (Stroke Patients Only) Modified Rankin (Stroke Patients Only) Pre-Morbid Rankin Score: No significant disability Modified Rankin: Severe disability     Balance Overall balance assessment: Needs assistance Sitting-balance support: Feet supported;No upper extremity supported Sitting balance-Leahy Scale: Fair Sitting balance - Comments: close supervision EOB while attempting to donn socks.   Standing balance support: Bilateral upper extremity supported Standing balance-Leahy Scale: Poor Standing balance comment: needs support from therapists and RW                            Cognition Arousal/Alertness: Awake/alert Behavior During Therapy: Restless;Impulsive Overall Cognitive Status: Impaired/Different from baseline Area of Impairment: Orientation;Memory;Attention;Following commands;Safety/judgement;Awareness;Problem solving                 Orientation Level: Disoriented to;Place;Time;Situation Current Attention Level: Sustained (very brief sustained, very easily distracted) Memory: Decreased short-term memory;Decreased recall of precautions Following Commands: Follows one step commands with increased time;Follows one step commands inconsistently Safety/Judgement: Decreased awareness of safety;Decreased awareness of deficits Awareness: Intellectual Problem Solving: Difficulty sequencing;Requires verbal cues;Requires tactile cues General Comments: Pt is very easily  distracted, unable to sustain attention for very long  at all and confabulatory in his speech.  Not sure if he is close to baseline or significantly worse.      Exercises      General Comments        Pertinent Vitals/Pain Pain Assessment: No/denies pain    Home Living                      Prior Function            PT Goals (current goals can now be found in the care plan section) Progress towards PT goals: Progressing toward goals    Frequency    Min 3X/week      PT Plan Current plan remains appropriate    Co-evaluation PT/OT/SLP Co-Evaluation/Treatment: Yes Reason for Co-Treatment: Complexity of the patient's impairments (multi-system involvement);Necessary to address cognition/behavior during functional activity;For patient/therapist safety;To address functional/ADL transfers PT goals addressed during session: Mobility/safety with mobility;Balance;Proper use of DME;Strengthening/ROM OT goals addressed during session: ADL's and self-care      AM-PAC PT "6 Clicks" Mobility   Outcome Measure  Help needed turning from your back to your side while in a flat bed without using bedrails?: A Little Help needed moving from lying on your back to sitting on the side of a flat bed without using bedrails?: A Little Help needed moving to and from a bed to a chair (including a wheelchair)?: A Lot Help needed standing up from a chair using your arms (e.g., wheelchair or bedside chair)?: A Lot Help needed to walk in hospital room?: Total Help needed climbing 3-5 steps with a railing? : Total 6 Click Score: 12    End of Session Equipment Utilized During Treatment: Gait belt Activity Tolerance: Patient tolerated treatment well Patient left: in bed;with call bell/phone within reach;with bed alarm set;with restraints reapplied (just posey, left mittens off so he could eat.) Nurse Communication: Other (comment) (left mittens off for eating to RN tech) PT Visit Diagnosis:  Other abnormalities of gait and mobility (R26.89);Hemiplegia and hemiparesis;Muscle weakness (generalized) (M62.81) Hemiplegia - Right/Left: Left Hemiplegia - dominant/non-dominant: Non-dominant Hemiplegia - caused by: Cerebral infarction     Time: 1220-1242 PT Time Calculation (min) (ACUTE ONLY): 22 min  Charges:  $Therapeutic Activity: 8-22 mins                    Verdene Lennert, PT, DPT  Acute Rehabilitation Ortho Tech Supervisor [phone] pager [phone]) [phone] office

## 2021-06-05 NOTE — Progress Notes (Signed)
Found pt sitting up at the foot of the bed with BLE hanging off the bed. Posey belt intact and secured to the bed frame properly. Bilat hand mitts on but pt picking at them to get them off. Pt pulled up in bed and posey belt tightened.

## 2021-06-06 LAB — GLUCOSE, CAPILLARY
Glucose-Capillary: 159 mg/dL — ABNORMAL HIGH (ref 70–99)
Glucose-Capillary: 168 mg/dL — ABNORMAL HIGH (ref 70–99)
Glucose-Capillary: 199 mg/dL — ABNORMAL HIGH (ref 70–99)
Glucose-Capillary: 150 mg/dL — ABNORMAL HIGH (ref 70–99)

## 2021-06-06 LAB — RENAL FUNCTION PANEL
Albumin: 2.8 g/dL — ABNORMAL LOW (ref 3.5–5.0)
Anion gap: 7 (ref 5–15)
BUN: 56 mg/dL — ABNORMAL HIGH (ref 8–23)
CO2: 25 mmol/L (ref 22–32)
Calcium: 9.2 mg/dL (ref 8.9–10.3)
Chloride: 110 mmol/L (ref 98–111)
Creatinine, Ser: 1.56 mg/dL — ABNORMAL HIGH (ref 0.61–1.24)
GFR, Estimated: 46 mL/min — ABNORMAL LOW (ref >60)
Glucose, Bld: 192 mg/dL — ABNORMAL HIGH (ref 70–99)
Phosphorus: 2.7 mg/dL (ref 2.5–4.6)
Potassium: 3.9 mmol/L (ref 3.5–5.1)
Sodium: 142 mmol/L (ref 135–145)

## 2021-06-06 LAB — CBC WITH DIFFERENTIAL/PLATELET
Abs Immature Granulocytes: 0.07 10*3/uL (ref 0.00–0.07)
Basophils Absolute: 0 10*3/uL (ref 0.0–0.1)
Basophils Relative: 0 %
Eosinophils Absolute: 0.1 10*3/uL (ref 0.0–0.5)
Eosinophils Relative: 1 %
HCT: 34.9 % — ABNORMAL LOW (ref 39.0–52.0)
Hemoglobin: 11.5 g/dL — ABNORMAL LOW (ref 13.0–17.0)
Immature Granulocytes: 1 %
Lymphocytes Relative: 34 %
Lymphs Abs: 3.9 10*3/uL (ref 0.7–4.0)
MCH: 33.7 pg (ref 26.0–34.0)
MCHC: 33 g/dL (ref 30.0–36.0)
MCV: 102.3 fL — ABNORMAL HIGH (ref 80.0–100.0)
Monocytes Absolute: 0.3 10*3/uL (ref 0.1–1.0)
Monocytes Relative: 2 %
Neutro Abs: 7.1 10*3/uL (ref 1.7–7.7)
Neutrophils Relative %: 62 %
Platelets: 247 10*3/uL (ref 150–400)
RBC: 3.41 MIL/uL — ABNORMAL LOW (ref 4.22–5.81)
RDW: 13.4 % (ref 11.5–15.5)
WBC: 11.5 10*3/uL — ABNORMAL HIGH (ref 4.0–10.5)
nRBC: 0 % (ref 0.0–0.2)

## 2021-06-06 MED ORDER — POLYETHYLENE GLYCOL 3350 17 G PO PACK
17.0000 g | PACK | Freq: Every day | ORAL | Status: DC
  Administered 2021-06-06 – 2021-06-19 (×12): 17 g via ORAL
  Filled 2021-06-06 (×14): qty 1

## 2021-06-06 MED ORDER — SENNOSIDES-DOCUSATE SODIUM 8.6-50 MG PO TABS
1.0000 | ORAL_TABLET | Freq: Every day | ORAL | Status: DC
  Administered 2021-06-06 – 2021-07-02 (×24): 1 via ORAL
  Filled 2021-06-06 (×25): qty 1

## 2021-06-06 NOTE — Progress Notes (Signed)
HD#10 Subjective:  Overnight Events: NAEON  Patient denied any acute concerns. He appeared more confused compared to yesterday.     Objective:  Vital signs in last 24 hours: Vitals:   06/05/21 2030 06/06/21 0049 06/06/21 0520 06/06/21 0600  BP: (!) 129/91 (!) 136/91    Pulse: 97 99 (!) 103   Resp: 20 20 20    Temp: 97.6 F (36.4 C) 98.4 F (36.9 C) 97.9 F (36.6 C)   TempSrc: Oral Axillary Axillary   SpO2: 98% 98%    Weight:    82.4 kg   Supplemental O2: Room Air SpO2:  (Patient refused. Fritz Pickerel, NT)   Physical Exam:   General: NAD Head: Normocephalic without scalp lesions.  Neck: Neck supple with full range of motion (ROM).  Lungs: CTAB, no wheeze, rhonchi or rales.  Cardiovascular:Regular rate, irregular rhythm  Abdomen: No TTP, normal bowel sounds MSK: No asymmetry or muscle atrophy. No LE edema.  Skin: warm, dry, good skin turgor, no lesions Neuro: Alert but  disoriented to person, place, or time. CN grossly intact Psych: Normal mood and normal affect   Intake/Output Summary (Last 24 hours) at 06/06/2021 0717 Last data filed at 06/05/2021 1300 Gross per 24 hour  Intake --  Output 400 ml  Net -400 ml    Net IO Since Admission: -2,832 mL [06/06/21 0717]  Pertinent Labs: CBC Latest Ref Rng & Units 06/06/2021 06/05/2021 06/04/2021  WBC 4.0 - 10.5 K/uL 11.5(H) 10.5 9.3  Hemoglobin 13.0 - 17.0 g/dL 11.5(L) 11.6(L) 11.0(L)  Hematocrit 39.0 - 52.0 % 34.9(L) 34.9(L) 32.9(L)  Platelets 150 - 400 K/uL 247 210 203   CMP Latest Ref Rng & Units 06/06/2021 06/05/2021 06/04/2021  Glucose 70 - 99 mg/dL 192(H) 202(H) 131(H)  BUN 8 - 23 mg/dL 56(H) 58(H) 52(H)  Creatinine 0.61 - 1.24 mg/dL 1.56(H) 1.69(H) 1.93(H)  Sodium 135 - 145 mmol/L 142 141 141  Potassium 3.5 - 5.1 mmol/L 3.9 4.5 3.7  Chloride 98 - 111 mmol/L 110 111 108  CO2 22 - 32 mmol/L 25 20(L) 21(L)  Calcium 8.9 - 10.3 mg/dL 9.2 9.3 9.1  Total Protein 6.5 - 8.1 g/dL - - -  Total Bilirubin 0.3  - 1.2 mg/dL - - -  Alkaline Phos 38 - 126 U/L - - -  AST 15 - 41 U/L - - -  ALT 0 - 44 U/L - - -    Imaging: No results found.  Assessment/Plan:   Principal Problem:   Acute CVA (cerebrovascular accident) ([place]) Active Problems:   TIA (transient ischemic attack)   Malnutrition of moderate degree  Patient Summary: Nishan Ovens is a 75 y.o. with a pertinent PMH of A. 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   -Entresto and spironolactone held due to soft pressures and low PO intake. -High intensity statin, atorvastatin 80 mg daily -Continue working on risk management (HTN, T2DM)   Acute delirium due to hospitalization with underlying dementia, UTI, and urinary obstruction Improvement in mentation following relief of urinary obstruction and treatment for UTI. Continues to be disoriented and waxing/waning delirium. He did not have mittens on when evaluated at bedside.  We will attempt to slowly remove these as it is safe per patient.  Improved dysarthria. More improvement noted when wife was at bedside. Plan is to update wife today.  - Seroquel 25 mg qhs --Remeron and rivastigmine qHS   AKI on [facility] Nephrology and urology consulted. AKI improving with Cr 1.56 from 2.13 before foley placement. Cipro 500 mg BID started for complicated UTI. Day 4/7. Cipro discontinued 06/05/21 due to negative cultures.  -Continue to monitor for worsening symptoms or leukocytosis.  -Continue to follow recs by urology.   Complicated UTI  UTI suggested by UA. Cipro 500 mg BID started for complicated UTI. Discontinued on 06/05/21 after 4 days as cultures show mixed growth. Urine still blood tinged. -Continue to  follow recs by urology.   -Will reach out to urology for any additional recs 06/06/21  Leukocytosis Patient leukocytosis resolved but has recurred with WBC at 11.5 on 06/06/21 from 10.5 yesterday. He has remained afebrile and without other signs or symptoms of acute infection.  -Daily CBCs to monitor leukocytosis   Atrial Fibrillation Chronic -Continue Eliquis  Constipation (resolving) Patient is constipated and does not have a recorded bowel movement in the chart. He is on both Senna 1 tablet nightly and on Miralax 17 g daily since 06/02/21. He did not produce a bowel movement but has been passing gas. Bowel sounds are present. DRE was performed that showed soft stool in the rectum with no impaction noted. Multiples bowel movements recorded.  -Continue Miralax and Senna but reduced dosing.  -Continue to monitor  Macrocytic anemia Patient has hgb that down-trended is currently 11.5 on 06/05/21. MCV was elevated and folic acid resulted low at 5.5. VB12 in normal range.  -Continue folic acid.  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  We will continue to monitor and continue metoprolol 25 mg.  Continue to hold spironolactone and Entresto. - Monitor volume status, euvolemic to mildly hypovolemic on exam 06/06/21. Continue to encourage PO intake as above. - Continue metoprolol at 25 mg  Hypokalemia, resolved K of 3.9 on 10/22 am.   -Continue to monitor  Diet:  dysphagia 3 IVF: None,None VTE: Eliquis Code: Full PT/OT recs: SNF for Subacute PT,. TOC recs: Pending Placement/needs further medical workup Family Update: Updated wife on 06/02/21, family at bedside.   Dispo: Anticipated discharge to Skilled nursing facility pending discontinuation of restraints and need for sedating medications.  [name], MD Tillie Rung. [facility] Internal Medicine Residency, PGY-1  [phone] On weekends  and after 5 pm call the on-call pager [phone].

## 2021-06-07 LAB — RENAL FUNCTION PANEL
Albumin: 2.7 g/dL — ABNORMAL LOW (ref 3.5–5.0)
Anion gap: 9 (ref 5–15)
BUN: 45 mg/dL — ABNORMAL HIGH (ref 8–23)
CO2: 23 mmol/L (ref 22–32)
Calcium: 8.9 mg/dL (ref 8.9–10.3)
Chloride: 109 mmol/L (ref 98–111)
Creatinine, Ser: 1.44 mg/dL — ABNORMAL HIGH (ref 0.61–1.24)
GFR, Estimated: 50 mL/min — ABNORMAL LOW (ref >60)
Glucose, Bld: 199 mg/dL — ABNORMAL HIGH (ref 70–99)
Phosphorus: 2.7 mg/dL (ref 2.5–4.6)
Potassium: 3.8 mmol/L (ref 3.5–5.1)
Sodium: 141 mmol/L (ref 135–145)

## 2021-06-07 LAB — CBC WITH DIFFERENTIAL/PLATELET
Abs Immature Granulocytes: 0.09 10*3/uL — ABNORMAL HIGH (ref 0.00–0.07)
Basophils Absolute: 0 10*3/uL (ref 0.0–0.1)
Basophils Relative: 0 %
Eosinophils Absolute: 0.2 10*3/uL (ref 0.0–0.5)
Eosinophils Relative: 1 %
HCT: 33 % — ABNORMAL LOW (ref 39.0–52.0)
Hemoglobin: 10.9 g/dL — ABNORMAL LOW (ref 13.0–17.0)
Immature Granulocytes: 1 %
Lymphocytes Relative: 33 %
Lymphs Abs: 4.2 10*3/uL — ABNORMAL HIGH (ref 0.7–4.0)
MCH: 33.7 pg (ref 26.0–34.0)
MCHC: 33 g/dL (ref 30.0–36.0)
MCV: 102.2 fL — ABNORMAL HIGH (ref 80.0–100.0)
Monocytes Absolute: 0.2 10*3/uL (ref 0.1–1.0)
Monocytes Relative: 2 %
Neutro Abs: 8 10*3/uL — ABNORMAL HIGH (ref 1.7–7.7)
Neutrophils Relative %: 63 %
Platelets: 258 10*3/uL (ref 150–400)
RBC: 3.23 MIL/uL — ABNORMAL LOW (ref 4.22–5.81)
RDW: 13.4 % (ref 11.5–15.5)
WBC: 12.6 10*3/uL — ABNORMAL HIGH (ref 4.0–10.5)
nRBC: 0 % (ref 0.0–0.2)

## 2021-06-07 LAB — URINALYSIS, COMPLETE (UACMP) WITH MICROSCOPIC
Bacteria, UA: NONE SEEN
Bilirubin Urine: NEGATIVE
Glucose, UA: NEGATIVE mg/dL
Ketones, ur: NEGATIVE mg/dL
Nitrite: NEGATIVE
Protein, ur: 100 mg/dL — AB
RBC / HPF: >50 RBC/hpf — ABNORMAL HIGH (ref 0–5)
Specific Gravity, Urine: 1.014 (ref 1.005–1.030)
pH: 6 (ref 5.0–8.0)

## 2021-06-07 LAB — GLUCOSE, CAPILLARY
Glucose-Capillary: 190 mg/dL — ABNORMAL HIGH (ref 70–99)
Glucose-Capillary: 148 mg/dL — ABNORMAL HIGH (ref 70–99)
Glucose-Capillary: 220 mg/dL — ABNORMAL HIGH (ref 70–99)
Glucose-Capillary: 93 mg/dL (ref 70–99)

## 2021-06-07 MED ORDER — ORAL CARE MOUTH RINSE
15.0000 mL | Freq: Two times a day (BID) | OROMUCOSAL | Status: DC
  Administered 2021-06-07 – 2021-07-03 (×44): 15 mL via OROMUCOSAL

## 2021-06-07 MED ORDER — SODIUM CHLORIDE 0.9 % IV SOLN
1.0000 g | INTRAVENOUS | Status: DC
  Administered 2021-06-07 – 2021-06-08 (×2): 1 g via INTRAVENOUS
  Filled 2021-06-07 (×2): qty 10

## 2021-06-07 MED ORDER — SODIUM CHLORIDE 0.9 % IV SOLN: INTRAVENOUS | Status: DC | PRN

## 2021-06-07 NOTE — Progress Notes (Signed)
HD#11 Subjective:  Overnight Events: NAEON   No acute concerns voiced. When asked where he was at, he said either a nursing facility or a hospital but continued talking about him being in [place] war.    Objective:  Vital signs in last 24 hours: Vitals:   06/06/21 1237 06/06/21 1615 06/06/21 2049 06/07/21 0100  BP:  124/68 121/72 (!) 131/93  Pulse:  86 77 82  Resp:  14 18 20   Temp:  98.2 F (36.8 C) 98.4 F (36.9 C) 97.9 F (36.6 C)  TempSrc:  Oral Oral Axillary  SpO2:  97% 98% 98%  Weight:      Height: 6\' 2"  (1.88 m)      Supplemental O2: Room Air SpO2: 98 %   Physical Exam:   Physical Exam General: NAD Head: Normocephalic without scalp lesions.  Mouth: Dry mucus membrane. Tongue surface showing alterations Neck: Neck supple with full range of motion (ROM).  Lungs: CTAB, no wheeze, rhonchi or rales.  Cardiovascular: regular rate, irregular rhythm Abdomen: No TTP, normal bowel sounds MSK: No asymmetry or muscle atrophy.  Neuro: Alert and but not fully oriented. CN grossly intact Psych: Normal mood and normal affect   Intake/Output Summary (Last 24 hours) at 06/07/2021 0434 Last data filed at 06/06/2021 2158 Gross per 24 hour  Intake 237 ml  Output 1250 ml  Net -1013 ml    Net IO Since Admission: -3,845 mL [06/07/21 0434]  Pertinent Labs: CBC Latest Ref Rng & Units 06/07/2021 06/06/2021 06/05/2021  WBC 4.0 - 10.5 K/uL 12.6(H) 11.5(H) 10.5  Hemoglobin 13.0 - 17.0 g/dL 10.9(L) 11.5(L) 11.6(L)  Hematocrit 39.0 - 52.0 % 33.0(L) 34.9(L) 34.9(L)  Platelets 150 - 400 K/uL 258 247 210   CMP Latest Ref Rng & Units 06/07/2021 06/06/2021 06/05/2021  Glucose 70 - 99 mg/dL 199(H) 192(H) 202(H)  BUN 8 - 23 mg/dL 45(H) 56(H) 58(H)  Creatinine 0.61 - 1.24 mg/dL 1.44(H) 1.56(H) 1.69(H)  Sodium 135 - 145 mmol/L 141 142 141  Potassium 3.5 - 5.1 mmol/L 3.8 3.9 4.5  Chloride 98 - 111 mmol/L 109 110 111  CO2 22 - 32 mmol/L 23 25 20(L)  Calcium 8.9 - 10.3 mg/dL 8.9  9.2 9.3  Total Protein 6.5 - 8.1 g/dL - - -  Total Bilirubin 0.3 - 1.2 mg/dL - - -  Alkaline Phos 38 - 126 U/L - - -  AST 15 - 41 U/L - - -  ALT 0 - 44 U/L - - -    Imaging: No results found.  Assessment/Plan:   Principal Problem:   Acute CVA (cerebrovascular accident) ([place]) Active Problems:   TIA (transient ischemic attack)   Malnutrition of moderate degree  Patient Summary: [name] [name] is a 75 y.o. with a pertinent PMH of A. 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   -Entresto and spironolactone held due to soft pressures and low PO intake. -High intensity statin, atorvastatin 80 mg daily -Continue working on risk management (HTN, T2DM)   Acute delirium due to hospitalization with underlying dementia, UTI, and urinary obstruction Improvement in mentation following relief of urinary obstruction and treatment for UTI. Continues to be disoriented and waxing/waning delirium.  We will attempt to slowly remove these as  it is safe per patient. Improved dysarthria. More improvement noted when wife was at bedside. Wife updated 12/23. - Seroquel 25 mg qhs --Remeron and rivastigmine qHS  --Continue to wean his restraints  --Oral care ordered  AKI on [id number] (resolved) Complicated UTI Leukocytosis Nephrology and urology consulted. AKI improving with Cr 1.44 from 2.13 before foley placement. Baseline around 1.4-1.5. Cipro discontinued 06/05/21 due to negative cultures after 4 days as cultures showed mixed growth. Leukocytosis worsened today at 12.6 to 11.5. Urine still blood tinged but improving.  -Repeat UA and urine culture -Continue to follow recs by urology.   Atrial  Fibrillation Chronic -Continue Eliquis  Macrocytic anemia Patient has hgb that down-trended is currently 11.5 on 06/05/21. MCV was elevated and folic acid resulted low at 5.5. VB12 in normal range.  -Continue folic acid.  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  We will continue to monitor and continue metoprolol 25 mg.  Continue to hold spironolactone and Entresto. - Monitor volume status, euvolemic to mildly hypovolemic on exam 06/07/21. Continue to encourage PO intake as above. - Continue metoprolol at 25 mg  Constipation (resolved) Patient is constipated and does not have a recorded bowel movement in the chart. He is on both Senna 1 tablet nightly and on Miralax 17 g daily since 06/02/21. He did not produce a bowel movement but has been passing gas. Bowel sounds are present. DRE was performed that showed soft stool in the rectum with no impaction noted. Multiples bowel movements recorded.  -Continue Miralax and Senna but reduced dosing.  -Continue to monitor  Hypokalemia, resolved K of 3.9 on 10/22 am.   -Continue to monitor  Diet:  dysphagia 3 IVF: None,None VTE: Eliquis Code: Full PT/OT recs: SNF for Subacute PT,. TOC recs: Pending Placement/needs further medical workup Family Update: Updated wife on 06/02/21, family at bedside.   Dispo: Anticipated discharge to Skilled nursing facility pending discontinuation of restraints and need for sedating medications.  [name], MD Tillie Rung. [facility] Internal Medicine Residency, PGY-1  [phone] On weekends and after 5 pm call the on-call pager [phone].

## 2021-06-07 NOTE — Progress Notes (Signed)
Patient trialed off belt restraints. Patient very confused, constantly trying to elope. He is not redirectable and gets agitated when repositioned.

## 2021-06-08 LAB — GLUCOSE, CAPILLARY
Glucose-Capillary: 147 mg/dL — ABNORMAL HIGH (ref 70–99)
Glucose-Capillary: 292 mg/dL — ABNORMAL HIGH (ref 70–99)
Glucose-Capillary: 223 mg/dL — ABNORMAL HIGH (ref 70–99)
Glucose-Capillary: 208 mg/dL — ABNORMAL HIGH (ref 70–99)

## 2021-06-08 LAB — CBC WITH DIFFERENTIAL/PLATELET
Abs Immature Granulocytes: 0.08 10*3/uL — ABNORMAL HIGH (ref 0.00–0.07)
Basophils Absolute: 0 10*3/uL (ref 0.0–0.1)
Basophils Relative: 0 %
Eosinophils Absolute: 0.2 10*3/uL (ref 0.0–0.5)
Eosinophils Relative: 2 %
HCT: 34.9 % — ABNORMAL LOW (ref 39.0–52.0)
Hemoglobin: 10.8 g/dL — ABNORMAL LOW (ref 13.0–17.0)
Immature Granulocytes: 1 %
Lymphocytes Relative: 38 %
Lymphs Abs: 4.5 10*3/uL — ABNORMAL HIGH (ref 0.7–4.0)
MCH: 33.2 pg (ref 26.0–34.0)
MCHC: 30.9 g/dL (ref 30.0–36.0)
MCV: 107.4 fL — ABNORMAL HIGH (ref 80.0–100.0)
Monocytes Absolute: 0.3 10*3/uL (ref 0.1–1.0)
Monocytes Relative: 3 %
Neutro Abs: 6.7 10*3/uL (ref 1.7–7.7)
Neutrophils Relative %: 56 %
Platelets: 276 10*3/uL (ref 150–400)
RBC: 3.25 MIL/uL — ABNORMAL LOW (ref 4.22–5.81)
RDW: 13.3 % (ref 11.5–15.5)
WBC: 11.9 10*3/uL — ABNORMAL HIGH (ref 4.0–10.5)
nRBC: 0 % (ref 0.0–0.2)

## 2021-06-08 LAB — RENAL FUNCTION PANEL
Albumin: 2.7 g/dL — ABNORMAL LOW (ref 3.5–5.0)
Anion gap: 9 (ref 5–15)
BUN: 37 mg/dL — ABNORMAL HIGH (ref 8–23)
CO2: 23 mmol/L (ref 22–32)
Calcium: 8.9 mg/dL (ref 8.9–10.3)
Chloride: 108 mmol/L (ref 98–111)
Creatinine, Ser: 1.34 mg/dL — ABNORMAL HIGH (ref 0.61–1.24)
GFR, Estimated: 55 mL/min — ABNORMAL LOW (ref >60)
Glucose, Bld: 162 mg/dL — ABNORMAL HIGH (ref 70–99)
Phosphorus: 2.8 mg/dL (ref 2.5–4.6)
Potassium: 3.8 mmol/L (ref 3.5–5.1)
Sodium: 140 mmol/L (ref 135–145)

## 2021-06-08 NOTE — Progress Notes (Signed)
HD#12 Subjective:  Overnight Events: NAEON  No acute concerns voiced. When asked where he was at he stated a hospital in [place]. When asked regarding the year he responded saying 2020. Appeared more alert and talkative today with clearer speech.   Objective:  Vital signs in last 24 hours: Vitals:   06/07/21 1935 06/08/21 0005 06/08/21 0408 06/08/21 0414  BP: 111/84 133/76 130/89   Pulse: 71 80 (!) 101   Resp: 18 18 18    Temp: 98.1 F (36.7 C) 97.8 F (36.6 C) 97.7 F (36.5 C)   TempSrc: Oral Oral Oral   SpO2: 99% 98% 100%   Weight:    84.8 kg  Height:       Supplemental O2: Room Air SpO2: 100 %   Physical Exam:   Physical Exam General: NAD Head: Normocephalic without scalp lesions.  Mouth: Moist mucus membrane.  Neck: Neck supple with full range of motion (ROM).  Lungs: CTAB, no wheeze, rhonchi or rales.  Cardiovascular: regular rate, irregular rhythm Abdomen: No TTP, normal bowel sounds MSK: No asymmetry or muscle atrophy.  Neuro: Alert and oriented x2. CN grossly intact Psych: Normal mood and normal affect   Intake/Output Summary (Last 24 hours) at 06/08/2021 0616 Last data filed at 06/07/2021 2143 Gross per 24 hour  Intake 1321.51 ml  Output 400 ml  Net 921.51 ml    Net IO Since Admission: -3,598.49 mL [06/08/21 0616]  Pertinent Labs: CBC Latest Ref Rng & Units 06/08/2021 06/07/2021 06/06/2021  WBC 4.0 - 10.5 K/uL 11.9(H) 12.6(H) 11.5(H)  Hemoglobin 13.0 - 17.0 g/dL 10.8(L) 10.9(L) 11.5(L)  Hematocrit 39.0 - 52.0 % 34.9(L) 33.0(L) 34.9(L)  Platelets 150 - 400 K/uL 276 258 247   CMP Latest Ref Rng & Units 06/08/2021 06/07/2021 06/06/2021  Glucose 70 - 99 mg/dL 162(H) 199(H) 192(H)  BUN 8 - 23 mg/dL 37(H) 45(H) 56(H)  Creatinine 0.61 - 1.24 mg/dL 1.34(H) 1.44(H) 1.56(H)  Sodium 135 - 145 mmol/L 140 141 142  Potassium 3.5 - 5.1 mmol/L 3.8 3.8 3.9  Chloride 98 - 111 mmol/L 108 109 110  CO2 22 - 32 mmol/L 23 23 25   Calcium 8.9 - 10.3 mg/dL 8.9  8.9 9.2  Total Protein 6.5 - 8.1 g/dL - - -  Total Bilirubin 0.3 - 1.2 mg/dL - - -  Alkaline Phos 38 - 126 U/L - - -  AST 15 - 41 U/L - - -  ALT 0 - 44 U/L - - -    Imaging: No results found.  Assessment/Plan:   Principal Problem:   Acute CVA (cerebrovascular accident) ([place]) Active Problems:   TIA (transient ischemic attack)   Malnutrition of moderate degree  Patient Summary: Bronson Bressman is a 75 y.o. with a pertinent PMH of A. 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  -Home medications include metoprolol, entresto, and spironolactone.   -Entresto and spironolactone held due to soft pressures and low PO intake in setting of delirium. -High intensity statin, atorvastatin 80 mg daily -Continue working on risk management (HTN, T2DM)   Acute delirium due to hospitalization with underlying dementia, UTI, and urinary obstruction Improvement in mentation following relief of urinary obstruction and treatment for UTI. Continues to  be disoriented and waxing/waning delirium.  We will attempt to slowly remove these as it is safe per patient. Improved dysarthria. More improvement noted when wife was at bedside. Wife updated 12/23. - Seroquel 25 mg qhs --Remeron and rivastigmine qHS  --Continue to wean his restraints    AKI on [id number] (resolved) Complicated UTI Leukocytosis Nephrology and urology consulted. AKI improving with Cr 1.34 from 2.13 before foley placement. Baseline around 1.4-1.5. Leukocytosis improved today from 12.6 to 11.9. Urine still blood tinged but improving.  -UA showed large leukocytes. Urine culture grew >100,000 staph epidermidis. IV ceftriaxone; will change after sensitives result   -Voiding trial tomorrow 06/09/21 per urology's recommendations.   Atrial Fibrillation Chronic -Continue Eliquis  Macrocytic anemia Patient has hgb that down-trended is currently 10.8 on 06/08/21. MCV was elevated and folic acid resulted low at 5.5. VB12 in normal range.  -Continue folic acid.  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  We will continue to monitor and continue metoprolol 25 mg.  Continue to hold spironolactone and Entresto 06/08/21. - Monitor volume status, euvolemic to mildly hypovolemic on exam 06/08/21. Continue to encourage PO intake as above. - Continue metoprolol at 25 mg  Constipation (resolved) Patient is had no bowel movement recorded but after bowel regimen has been having bowel movements. -Continue Miralax and Senna b-Continue to monitor  Hypokalemia, resolved K of 3.8 on 10/24 am.   -Continue to monitor  Diet:  dysphagia 3 IVF: None,None VTE: Eliquis Code: Full PT/OT recs: SNF for Subacute PT,. TOC recs: Pending Placement/needs further medical workup Family Update: Updated wife on 06/02/21, family at bedside.   Dispo: Anticipated discharge to Skilled nursing facility pending discontinuation of restraints and need for sedating medications.  [name], MD Tillie Rung. [facility] Internal Medicine Residency, PGY-1  [phone] On weekends and after 5 pm call the on-call pager [phone].

## 2021-06-09 DIAGNOSIS — I639 Cerebral infarction, unspecified: Secondary | ICD-10-CM | POA: Diagnosis not present

## 2021-06-09 LAB — GLUCOSE, CAPILLARY
Glucose-Capillary: 160 mg/dL — ABNORMAL HIGH (ref 70–99)
Glucose-Capillary: 163 mg/dL — ABNORMAL HIGH (ref 70–99)
Glucose-Capillary: 204 mg/dL — ABNORMAL HIGH (ref 70–99)
Glucose-Capillary: 190 mg/dL — ABNORMAL HIGH (ref 70–99)
Glucose-Capillary: 225 mg/dL — ABNORMAL HIGH (ref 70–99)

## 2021-06-09 LAB — CBC WITH DIFFERENTIAL/PLATELET
Abs Immature Granulocytes: 0.1 10*3/uL — ABNORMAL HIGH (ref 0.00–0.07)
Basophils Absolute: 0 10*3/uL (ref 0.0–0.1)
Basophils Relative: 0 %
Eosinophils Absolute: 0.2 10*3/uL (ref 0.0–0.5)
Eosinophils Relative: 1 %
HCT: 33.7 % — ABNORMAL LOW (ref 39.0–52.0)
Hemoglobin: 10.7 g/dL — ABNORMAL LOW (ref 13.0–17.0)
Immature Granulocytes: 1 %
Lymphocytes Relative: 41 %
Lymphs Abs: 5.4 10*3/uL — ABNORMAL HIGH (ref 0.7–4.0)
MCH: 32.9 pg (ref 26.0–34.0)
MCHC: 31.8 g/dL (ref 30.0–36.0)
MCV: 103.7 fL — ABNORMAL HIGH (ref 80.0–100.0)
Monocytes Absolute: 0.2 10*3/uL (ref 0.1–1.0)
Monocytes Relative: 2 %
Neutro Abs: 7.3 10*3/uL (ref 1.7–7.7)
Neutrophils Relative %: 55 %
Platelets: 299 10*3/uL (ref 150–400)
RBC: 3.25 MIL/uL — ABNORMAL LOW (ref 4.22–5.81)
RDW: 13.2 % (ref 11.5–15.5)
WBC: 13.2 10*3/uL — ABNORMAL HIGH (ref 4.0–10.5)
nRBC: 0 % (ref 0.0–0.2)

## 2021-06-09 LAB — URINE CULTURE: Culture: >=100000 — AB

## 2021-06-09 LAB — RENAL FUNCTION PANEL
Albumin: 2.9 g/dL — ABNORMAL LOW (ref 3.5–5.0)
Anion gap: 7 (ref 5–15)
BUN: 34 mg/dL — ABNORMAL HIGH (ref 8–23)
CO2: 25 mmol/L (ref 22–32)
Calcium: 9 mg/dL (ref 8.9–10.3)
Chloride: 106 mmol/L (ref 98–111)
Creatinine, Ser: 1.32 mg/dL — ABNORMAL HIGH (ref 0.61–1.24)
GFR, Estimated: 56 mL/min — ABNORMAL LOW (ref >60)
Glucose, Bld: 163 mg/dL — ABNORMAL HIGH (ref 70–99)
Phosphorus: 2.3 mg/dL — ABNORMAL LOW (ref 2.5–4.6)
Potassium: 4 mmol/L (ref 3.5–5.1)
Sodium: 138 mmol/L (ref 135–145)

## 2021-06-09 LAB — MAGNESIUM: Magnesium: 1.8 mg/dL (ref 1.7–2.4)

## 2021-06-09 MED ORDER — CEFTRIAXONE SODIUM 1 G IJ SOLR
1.0000 g | INTRAMUSCULAR | Status: AC
Start: 2021-06-09 — End: 2021-06-10
  Filled 2021-06-09: qty 10

## 2021-06-09 MED ORDER — INSULIN GLARGINE-YFGN 100 UNIT/ML ~~LOC~~ SOLN
4.0000 [IU] | Freq: Every day | SUBCUTANEOUS | Status: DC
  Administered 2021-06-09 – 2021-06-25 (×17): 4 [IU] via SUBCUTANEOUS
  Filled 2021-06-09 (×19): qty 0.04

## 2021-06-09 NOTE — Progress Notes (Signed)
Occupational Therapy Treatment Patient Details Name: [name] [name] MRN: [id number] DOB: 20-Apr-1946 Today's Date: 06/09/2021   History of present illness [name] is a 75 y/o male admitted 05/25/21 with c/o left leg weakness. [name] has dementia with short-term memory deficits at baseline. 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 PMH: A. 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   OT comments  [name] is making physical progress towards his goals but continues to be severely limited by cognition. Session completed in conjunction with PT for pt and therapist safety. Pt required min A for bed mobility, min-mod A +2 fro transfers and short ambulation with RW, and min A for grooming tasks while standing at the sink. Pt tangential throughout session, speaking about the past, fixated on urinating, and RLE pain. Pt was oriented and catheter placement explained with seemingly little to no reception of information. Pt continues to benefit from OT acutely, goals updated. D/c recommendation remains appropriate.    Recommendations for follow up therapy are one component of a multi-disciplinary discharge planning process, led by the attending physician.  Recommendations may be updated based on patient status, additional functional criteria and insurance authorization.    Follow Up Recommendations  Skilled nursing-short term rehab (<3 hours/day)    Assistance Recommended at Discharge Frequent or constant Supervision/Assistance  Equipment Recommendations  None recommended by OT    Recommendations for Other Services      Precautions / Restrictions Precautions Precautions: Fall Precaution Comments: very confused, delusional, confabulatory Restrictions Weight Bearing Restrictions: No       Mobility Bed Mobility Overal bed mobility: Needs Assistance Bed Mobility: Supine to Sit;Sit to Supine     Supine to  sit: Min assist Sit to supine: Min assist   General bed mobility comments: pt able to elevate trunk with minAx1, requiring minA to aide with LE management back into bed    Transfers Overall transfer level: Needs assistance Equipment used: Rolling walker (2 wheels) Transfers: Sit to/from Stand Sit to Stand: Min assist;Mod assist;+2 physical assistance           General transfer comment: pt requiring min/modA to power up into standing, max verbal cues for safe hand placement (not to pull up on walker), bed elevated as pt is 6'5"     Balance Overall balance assessment: Needs assistance Sitting-balance support: Feet supported;No upper extremity supported Sitting balance-Leahy Scale: Fair Sitting balance - Comments: close supervision EOB while attempting to donn socks.   Standing balance support: Single extremity supported Standing balance-Leahy Scale: Poor Standing balance comment: need support of L hand to wash face a sink, PT to support pt at trunk while pt worked with OT for ADL at sink                           ADL either performed or assessed with clinical judgement   ADL Overall ADL's : Needs assistance/impaired Eating/Feeding: Supervision/ safety;Set up;Minimal assistance;Sitting;Bed level   Grooming: Minimal assistance;Standing Grooming Details (indicate cue type and reason): min A in standing at the sink for standing balance, verbal cues for mangement of items and sequencing                 Toilet Transfer: Minimal assistance;+2 for safety/equipment;Ambulation;Rolling walker (2 wheels) Toilet Transfer Details (indicate cue type and reason): simulated with ambulation in the room. pt not undesrtanding he has a catheter and cannot pee  at the toilet. +2 for safety, min A phyiscal assist and for cues - pt easily distracted by environmental stimuli         Functional mobility during ADLs: Minimal assistance;+2 for safety/equipment;Rolling walker (2  wheels) General ADL Comments: pt mostly limited by poor cognition this session; he is physically improving wtih strength and ambulation. still required +2 assist for safety and impulsivity     Vision   Vision Assessment?: No apparent visual deficits   Perception     Praxis      Cognition Arousal/Alertness: Awake/alert Behavior During Therapy: Restless;Impulsive Overall Cognitive Status: Impaired/Different from baseline Area of Impairment: Orientation;Memory;Attention;Following commands;Safety/judgement;Awareness;Problem solving                 Orientation Level: Disoriented to;Place;Time;Situation Current Attention Level: Focused Memory: Decreased recall of precautions;Decreased short-term memory Following Commands: Follows one step commands with increased time Safety/Judgement: Decreased awareness of safety;Decreased awareness of deficits Awareness: Emergent Problem Solving: Slow processing;Decreased initiation;Difficulty sequencing;Requires verbal cues;Requires tactile cues General Comments: pt perseverating on going to see his Mom because "she hasn't seen her baby boy in a long time. Don't think she needs to see her baby boy?" Pt re-oriented multiple times however no carry over. pt also with difficulty understanding pt's urinary catheter and kept trying to "disconnect this so I can take a leak" despite max education          Exercises     Shoulder Instructions       General Comments VSS    Pertinent Vitals/ Pain       Pain Assessment: Faces Faces Pain Scale: Hurts a little bit Pain Location: pt rubbing R thigh s/p standing at sink stating "I need to sit down. This whole leg is fake." Pain Descriptors / Indicators: Aching Pain Intervention(s): Limited activity within patient's tolerance;Monitored during session  Home Living                                          Prior Functioning/Environment              Frequency  Min 2X/week         Progress Toward Goals  OT Goals(current goals can now be found in the care plan section)  Progress towards OT goals: Goals met and updated - see care plan  Acute Rehab OT Goals OT Goal Formulation: With patient Potential to Achieve Goals: Fair ADL Goals Pt Will Perform Grooming: with min guard assist;standing Pt Will Perform Lower Body Bathing: with min assist;sit to/from stand Pt Will Perform Lower Body Dressing: with min guard assist;sit to/from stand Pt Will Transfer to Toilet: with min guard assist;ambulating Pt Will Perform Toileting - Clothing Manipulation and hygiene: with min guard assist;sitting/lateral leans Pt Will Perform Tub/Shower Transfer: with min guard assist;ambulating;tub bench  Plan Discharge plan remains appropriate;Frequency remains appropriate    Co-evaluation    PT/OT/SLP Co-Evaluation/Treatment: Yes Reason for Co-Treatment: For patient/therapist safety PT goals addressed during session: Mobility/safety with mobility OT goals addressed during session: ADL's and self-care;Strengthening/ROM      AM-PAC OT "6 Clicks" Daily Activity     Outcome Measure   Help from another person eating meals?: A Little Help from another person taking care of personal grooming?: A Little Help from another person toileting, which includes using toliet, bedpan, or urinal?: A Lot Help from another person bathing (including washing, rinsing, drying)?: A Lot Help  from another person to put on and taking off regular upper body clothing?: A Little Help from another person to put on and taking off regular lower body clothing?: A Lot 6 Click Score: 15    End of Session Equipment Utilized During Treatment: Rolling walker (2 wheels);Gait belt  OT Visit Diagnosis: Unsteadiness on feet (R26.81);Other abnormalities of gait and mobility (R26.89);Muscle weakness (generalized) (M62.81);Repeated falls (R29.6);History of falling (Z91.81);Pain   Activity Tolerance Patient  tolerated treatment well   Patient Left in bed;with call bell/phone within reach;with bed alarm set;with restraints reapplied   Nurse Communication Mobility status        Time: 5678-8933 OT Time Calculation (min): 23 min  Charges: OT General Charges $OT Visit: 1 Visit OT Treatments $Self Care/Home Management : 8-22 mins   Lai Hendriks A Jennife Zaucha 06/09/2021, 2:58 PM

## 2021-06-09 NOTE — Plan of Care (Signed)
?  Problem: Clinical Measurements: ?Goal: Will remain free from infection ?Outcome: Progressing ?  ?

## 2021-06-09 NOTE — Progress Notes (Signed)
Physical Therapy Treatment Patient Details Name: [name] [name] MRN: [id number] DOB: Feb 10, 1946 Today's Date: 06/09/2021   History of Present Illness [name] is a 75 y/o male admitted 05/25/21 with c/o left leg weakness. [name] has dementia with short-term memory deficits at baseline. 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 PMH: A. 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    PT Comments    Pt continues to be confused perseverating on going to see his Mom because she needs to see her baby boy. Pt remains unaware of situation, confabulates, and poor recall and comprehension once re-educated and re-oriented frequently. Pt did demo improved ability to transfer and began gait training in addition to standing tolerance at sink when co-tx with OT today. Pt remains impulsive however less agitated/irritated when PT/OT would correct pt. Acute PT to cont to follow and progress mobility.    Recommendations for follow up therapy are one component of a multi-disciplinary discharge planning process, led by the attending physician.  Recommendations may be updated based on patient status, additional functional criteria and insurance authorization.  Follow Up Recommendations  Skilled nursing-short term rehab (<3 hours/day)     Assistance Recommended at Discharge Frequent or constant Supervision/Assistance  Equipment Recommendations   (TBD at next venue)    Recommendations for Other Services       Precautions / Restrictions Precautions Precautions: Fall Precaution Comments: very confused, delusional, confabulatory Restrictions Weight Bearing Restrictions: No     Mobility  Bed Mobility Overal bed mobility: Needs Assistance Bed Mobility: Supine to Sit;Sit to Supine     Supine to sit: Min assist Sit to supine: Min assist   General bed mobility comments: pt able to elevate trunk with minAx1, requiring  minA to aide with LE management back into bed    Transfers Overall transfer level: Needs assistance Equipment used: Rolling walker (2 wheels) Transfers: Sit to/from Stand Sit to Stand: Min assist;Mod assist;+2 physical assistance           General transfer comment: pt requiring min/modA to power up into standing, max verbal cues for safe hand placement (not to pull up on walker), bed elevated as pt is 6'5"    Ambulation/Gait Ambulation/Gait assistance: Min assist;+2 physical assistance Gait Distance (Feet): 12 Feet Assistive device: Rolling walker (2 wheels) Gait Pattern/deviations: Step-through pattern;Decreased stride length;Trunk flexed Gait velocity: dec Gait velocity interpretation: <1.8 ft/sec, indicate of risk for recurrent falls General Gait Details: pt was able to advance LEs and clear feet however easily distracted and focused on going to the bathroom to sit down and urinate, minA for walker management and to provide verbal cues, second minA for chair follow and tactile cues at hips for weightshift and to keep forward progression   Stairs             Wheelchair Mobility    Modified Rankin (Stroke Patients Only) Modified Rankin (Stroke Patients Only) Pre-Morbid Rankin Score: No significant disability Modified Rankin: Moderately severe disability     Balance Overall balance assessment: Needs assistance Sitting-balance support: Feet supported;No upper extremity supported Sitting balance-Leahy Scale: Fair Sitting balance - Comments: close supervision EOB while attempting to donn socks.   Standing balance support: Single extremity supported Standing balance-Leahy Scale: Poor Standing balance comment: need support of L hand to wash face a sink, PT to support pt at trunk while pt worked with OT for ADL at sink  Cognition Arousal/Alertness: Awake/alert Behavior During Therapy: Restless;Impulsive Overall Cognitive Status:  Impaired/Different from baseline Area of Impairment: Orientation;Memory;Attention;Following commands;Safety/judgement;Awareness;Problem solving                 Orientation Level: Disoriented to;Place;Time;Situation Current Attention Level: Focused Memory: Decreased recall of precautions;Decreased short-term memory Following Commands: Follows one step commands with increased time Safety/Judgement: Decreased awareness of safety;Decreased awareness of deficits Awareness: Emergent Problem Solving: Slow processing;Decreased initiation;Difficulty sequencing;Requires verbal cues;Requires tactile cues General Comments: pt perseverating on going to see his Mom because "she hasn't seen her baby boy in a long time. Don't think she needs to see her baby boy?" Pt re-oriented multiple times however no carry over. pt also with difficulty understanding pt's urinary catheter and kept trying to "disconnect this so I can take a leak" despite max education        Exercises      General Comments General comments (skin integrity, edema, etc.): VSS      Pertinent Vitals/Pain Faces Pain Scale: Hurts a little bit Pain Location: pt rubbing R thigh s/p standing at sink stating "I need to sit down. This whole leg is fake." Pain Descriptors / Indicators: Aching Pain Intervention(s): Limited activity within patient's tolerance    Home Living                          Prior Function            PT Goals (current goals can now be found in the care plan section) Acute Rehab PT Goals PT Goal Formulation: Patient unable to participate in goal setting Time For Goal Achievement: 06/23/21 Potential to Achieve Goals: Fair Progress towards PT goals: Progressing toward goals    Frequency    Min 3X/week      PT Plan Current plan remains appropriate    Co-evaluation PT/OT/SLP Co-Evaluation/Treatment: Yes Reason for Co-Treatment: For patient/therapist safety;Necessary to address  cognition/behavior during functional activity PT goals addressed during session: Mobility/safety with mobility        AM-PAC PT "6 Clicks" Mobility   Outcome Measure  Help needed turning from your back to your side while in a flat bed without using bedrails?: A Little Help needed moving from lying on your back to sitting on the side of a flat bed without using bedrails?: A Little Help needed moving to and from a bed to a chair (including a wheelchair)?: A Lot Help needed standing up from a chair using your arms (e.g., wheelchair or bedside chair)?: A Lot Help needed to walk in hospital room?: A Lot Help needed climbing 3-5 steps with a railing? : Total 6 Click Score: 13    End of Session Equipment Utilized During Treatment: Gait belt Activity Tolerance: Patient tolerated treatment well Patient left: in bed;with call bell/phone within reach;with bed alarm set;with restraints reapplied (posey belt re-applied) Nurse Communication: Mobility status PT Visit Diagnosis: Other abnormalities of gait and mobility (R26.89);Hemiplegia and hemiparesis;Muscle weakness (generalized) (M62.81) Hemiplegia - Right/Left: Left Hemiplegia - dominant/non-dominant: Non-dominant Hemiplegia - caused by: Cerebral infarction     Time: 7829-5621 PT Time Calculation (min) (ACUTE ONLY): 23 min  Charges:  $Gait Training: 8-22 mins                     Kittie Plater, PT, DPT Acute Rehabilitation Services Pager #: [phone] Office #: [phone]    Berline Lopes 06/09/2021, 2:35 PM

## 2021-06-09 NOTE — Progress Notes (Addendum)
HD#13 Subjective:  Overnight Events: NAEON  No acute concerns voiced. He was alert and talking but oriented to person only. Stated it was 2001 when asked about the year.     Objective:  Vital signs in last 24 hours: Vitals:   06/08/21 1610 06/08/21 2029 06/09/21 0500 06/09/21 0518  BP: 135/77 (!) 144/86  (!) 125/98  Pulse: 62 60  78  Resp: 20 16  16   Temp: 98.5 F (36.9 C) 98.8 F (37.1 C)  98.2 F (36.8 C)  TempSrc: Oral Oral  Axillary  SpO2: 100% 100%  100%  Weight:   85.6 kg   Height:       Supplemental O2: Room Air SpO2: 100 %   Physical Exam:   Physical Exam General: NAD Head: Normocephalic without scalp lesions.  Mouth: Lips normal color, without lesions. Moist mucus membrane. Neck: Neck supple with full range of motion (ROM).  Lungs: CTAB, no wheeze, rhonchi or rales.  Cardiovascular: Regular rate, irregular rhythm Abdomen: No TTP, normal bowel sounds MSK: No asymmetry or muscle atrophy. Full range of motion (ROM) of all joints.   Skin: warm, dry good skin turgor, no lesions Neuro: Alert and oriented to person. CN grossly intact. Moving all extremities. Psych: Normal mood and normal affect   Intake/Output Summary (Last 24 hours) at 06/09/2021 0704 Last data filed at 06/09/2021 0600 Gross per 24 hour  Intake 237 ml  Output 1250 ml  Net -1013 ml    Net IO Since Admission: -4,611.49 mL [06/09/21 0704]  Pertinent Labs: CBC Latest Ref Rng & Units 06/09/2021 06/08/2021 06/07/2021  WBC 4.0 - 10.5 K/uL 13.2(H) 11.9(H) 12.6(H)  Hemoglobin 13.0 - 17.0 g/dL 10.7(L) 10.8(L) 10.9(L)  Hematocrit 39.0 - 52.0 % 33.7(L) 34.9(L) 33.0(L)  Platelets 150 - 400 K/uL 299 276 258   CMP Latest Ref Rng & Units 06/09/2021 06/08/2021 06/07/2021  Glucose 70 - 99 mg/dL 163(H) 162(H) 199(H)  BUN 8 - 23 mg/dL 34(H) 37(H) 45(H)  Creatinine 0.61 - 1.24 mg/dL 1.32(H) 1.34(H) 1.44(H)  Sodium 135 - 145 mmol/L 138 140 141  Potassium 3.5 - 5.1 mmol/L 4.0 3.8 3.8  Chloride 98 -  111 mmol/L 106 108 109  CO2 22 - 32 mmol/L 25 23 23   Calcium 8.9 - 10.3 mg/dL 9.0 8.9 8.9  Total Protein 6.5 - 8.1 g/dL - - -  Total Bilirubin 0.3 - 1.2 mg/dL - - -  Alkaline Phos 38 - 126 U/L - - -  AST 15 - 41 U/L - - -  ALT 0 - 44 U/L - - -    Imaging: No results found.  Assessment/Plan:   Principal Problem:   Acute CVA (cerebrovascular accident) ([place]) Active Problems:   TIA (transient ischemic attack)   Malnutrition of moderate degree  Patient Summary: [name] [name] is a 75 y.o. with a pertinent PMH of A. 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  -Home medications include metoprolol, entresto, and spironolactone.   -Entresto and spironolactone currently held. Since increased PO intake recently will consider add entresto for HF.  -High intensity statin, atorvastatin 80 mg daily -Continue working on risk management (HTN, T2DM)   Acute delirium due to hospitalization with underlying dementia,  UTI, and urinary obstruction Improvement in mentation following relief of urinary obstruction and treatment for UTI. Continues to be disoriented 06/09/21. We will attempt to slowly remove restraints as it is safe per patient. Improved dysarthria. Wife updated 12/23. - Seroquel 25 mg qhs --Remeron and rivastigmine qHS  --Continue to wean his restraints    AKI on DTO6Z (resolved) Complicated UTI Leukocytosis Nephrology and urology consulted. AKI improving with Cr 1.32 from 2.13 before foley placement. Baseline around 1.4-1.5. Leukocytosis worsened today from 11.9 to 13.2. Urine color normal without blood.   -UA 10/23 showed large leukocytes. Urine culture grew >100,000 staph epidermidis. IV ceftriaxone Day  3/3; will continue to monitor.  -Voiding trial today with repeat q6hr bladder scans.   Atrial Fibrillation Chronic -Continue Eliquis  Macrocytic anemia Patient has hgb that is currently stable at 10.7 on 06/08/21. MCV was elevated and folic acid resulted low at 5.5. VB12 in normal range.  -Continue folic acid.  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  We will continue to monitor and continue metoprolol 25 mg.  Currently spironolactone and Delene Loll are held. Entresto and spironolactone currently held. Since increased PO intake recently will consider add entresto for HF. - Monitor volume status, euvolemic to mildly hypovolemic on exam 06/09/21. Continue to encourage PO intake as above. - Continue metoprolol at 25 mg  Constipation (resolved) Patient is had no bowel movement recorded but after bowel regimen has been having bowel movements. -Continue Miralax and Senna b-Continue to monitor  Hypokalemia, resolved K of 3.8 on 10/24 am.   -Continue to monitor  Diet:  dysphagia 3 IVF: None,None VTE: Eliquis Code: Full PT/OT recs: SNF for Subacute PT,. TOC recs: Pending Placement/needs further medical workup Family Update: Updated wife on 06/02/21, family at bedside.   Dispo: Anticipated discharge to Skilled nursing facility pending discontinuation of restraints and need for sedating medications.  [name], MD Tillie Rung. [facility] Internal Medicine Residency, PGY-1  [phone] On weekends and after 5 pm call the on-call pager [phone].

## 2021-06-10 DIAGNOSIS — I639 Cerebral infarction, unspecified: Secondary | ICD-10-CM | POA: Diagnosis not present

## 2021-06-10 LAB — CBC WITH DIFFERENTIAL/PLATELET
Abs Immature Granulocytes: 0.12 10*3/uL — ABNORMAL HIGH (ref 0.00–0.07)
Basophils Absolute: 0 10*3/uL (ref 0.0–0.1)
Basophils Relative: 0 %
Eosinophils Absolute: 0.1 10*3/uL (ref 0.0–0.5)
Eosinophils Relative: 1 %
HCT: 31.3 % — ABNORMAL LOW (ref 39.0–52.0)
Hemoglobin: 10.3 g/dL — ABNORMAL LOW (ref 13.0–17.0)
Immature Granulocytes: 1 %
Lymphocytes Relative: 41 %
Lymphs Abs: 5.6 10*3/uL — ABNORMAL HIGH (ref 0.7–4.0)
MCH: 33.2 pg (ref 26.0–34.0)
MCHC: 32.9 g/dL (ref 30.0–36.0)
MCV: 101 fL — ABNORMAL HIGH (ref 80.0–100.0)
Monocytes Absolute: 0.3 10*3/uL (ref 0.1–1.0)
Monocytes Relative: 2 %
Neutro Abs: 7.4 10*3/uL (ref 1.7–7.7)
Neutrophils Relative %: 55 %
Platelets: 321 10*3/uL (ref 150–400)
RBC: 3.1 MIL/uL — ABNORMAL LOW (ref 4.22–5.81)
RDW: 13.2 % (ref 11.5–15.5)
WBC: 13.6 10*3/uL — ABNORMAL HIGH (ref 4.0–10.5)
nRBC: 0 % (ref 0.0–0.2)

## 2021-06-10 LAB — RENAL FUNCTION PANEL
Albumin: 3 g/dL — ABNORMAL LOW (ref 3.5–5.0)
Anion gap: 8 (ref 5–15)
BUN: 33 mg/dL — ABNORMAL HIGH (ref 8–23)
CO2: 25 mmol/L (ref 22–32)
Calcium: 9 mg/dL (ref 8.9–10.3)
Chloride: 103 mmol/L (ref 98–111)
Creatinine, Ser: 1.34 mg/dL — ABNORMAL HIGH (ref 0.61–1.24)
GFR, Estimated: 55 mL/min — ABNORMAL LOW (ref >60)
Glucose, Bld: 173 mg/dL — ABNORMAL HIGH (ref 70–99)
Phosphorus: 2 mg/dL — ABNORMAL LOW (ref 2.5–4.6)
Potassium: 3.8 mmol/L (ref 3.5–5.1)
Sodium: 136 mmol/L (ref 135–145)

## 2021-06-10 LAB — GLUCOSE, CAPILLARY
Glucose-Capillary: 155 mg/dL — ABNORMAL HIGH (ref 70–99)
Glucose-Capillary: 232 mg/dL — ABNORMAL HIGH (ref 70–99)
Glucose-Capillary: 124 mg/dL — ABNORMAL HIGH (ref 70–99)
Glucose-Capillary: 137 mg/dL — ABNORMAL HIGH (ref 70–99)

## 2021-06-10 NOTE — Progress Notes (Signed)
Pt won't leave bilat mitts on. Has been pulled up and placed back in bed x5 since beginning of shift. New orders for bilat wrist restraints and renewal of posey belt. Wrist restraints ordered and will be placed on pt when they come to the floor.

## 2021-06-10 NOTE — Progress Notes (Signed)
Dr.Gardner paged for renewal of soft posey belt restraint and add bilat mitts r/t pt keeps climbing to FOB and now attempting to untie posey belt

## 2021-06-10 NOTE — Progress Notes (Addendum)
HD#14 Subjective:  Overnight Events: NAEON  No acute concerns voiced. He was alert and talking but oriented to person, place but not time or situation. Stated it was 2010 when asked about the year.     Objective:  Vital signs in last 24 hours: Vitals:   06/10/21 0349 06/10/21 0425 06/10/21 0535 06/10/21 0804  BP: (!) 131/100 115/90  133/90  Pulse: 71 60  66  Resp: 16   18  Temp: 97.6 F (36.4 C)   (!) 97.5 F (36.4 C)  TempSrc:    Oral  SpO2: 100%   97%  Weight:   85.3 kg   Height:       Supplemental O2: Room Air SpO2: 97 %   Physical Exam:   Physical Exam General: NAD Head: Normocephalic without scalp lesions.  Mouth: Lips normal color, without lesions. Moist mucus membrane. Neck: Neck supple with full range of motion (ROM).  Lungs: CTAB, no wheeze, rhonchi or rales.  Cardiovascular: regular rate, irregular rhythm.  Abdomen: No TTP, normal bowel sounds MSK: No asymmetry or muscle atrophy. Full range of motion (ROM) of all joints.   Neuro: Alert and but oriented to person and place and not time or situation. CN grossly intact Psych: Normal mood and normal affect   Intake/Output Summary (Last 24 hours) at 06/10/2021 0911 Last data filed at 06/10/2021 0645 Gross per 24 hour  Intake 1020 ml  Output 200 ml  Net 820 ml    Net IO Since Admission: -3,791.49 mL [06/10/21 0911]  Pertinent Labs: CBC Latest Ref Rng & Units 06/10/2021 06/09/2021 06/08/2021  WBC 4.0 - 10.5 K/uL 13.6(H) 13.2(H) 11.9(H)  Hemoglobin 13.0 - 17.0 g/dL 10.3(L) 10.7(L) 10.8(L)  Hematocrit 39.0 - 52.0 % 31.3(L) 33.7(L) 34.9(L)  Platelets 150 - 400 K/uL 321 299 276   CMP Latest Ref Rng & Units 06/10/2021 06/09/2021 06/08/2021  Glucose 70 - 99 mg/dL 173(H) 163(H) 162(H)  BUN 8 - 23 mg/dL 33(H) 34(H) 37(H)  Creatinine 0.61 - 1.24 mg/dL 1.34(H) 1.32(H) 1.34(H)  Sodium 135 - 145 mmol/L 136 138 140  Potassium 3.5 - 5.1 mmol/L 3.8 4.0 3.8  Chloride 98 - 111 mmol/L 103 106 108  CO2 22 - 32  mmol/L 25 25 23   Calcium 8.9 - 10.3 mg/dL 9.0 9.0 8.9  Total Protein 6.5 - 8.1 g/dL - - -  Total Bilirubin 0.3 - 1.2 mg/dL - - -  Alkaline Phos 38 - 126 U/L - - -  AST 15 - 41 U/L - - -  ALT 0 - 44 U/L - - -    Imaging: No results found.  Assessment/Plan:   Principal Problem:   Acute CVA (cerebrovascular accident) ([place]) Active Problems:   TIA (transient ischemic attack)   Malnutrition of moderate degree  Patient Summary: [name] [name] is a 75 y.o. with a pertinent PMH of A. 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  -Home medications include metoprolol, entresto, and spironolactone.   -Entresto and spironolactone currently held. Will continue to hold as pressure are normotensive and PO intake is not consistent.  -High intensity statin, atorvastatin 80 mg daily -Continue working on risk management (HTN, T2DM)   Acute delirium due to hospitalization  with underlying dementia, UTI, and urinary obstruction Improvement in mentation following relief of urinary obstruction and treatment for UTI. Continues to be disoriented 06/09/21. We will attempt to slowly remove restraints as it is safe per patient. Improved dysarthria. Wife updated 12/23. - Seroquel 25 mg qhs --Remeron and rivastigmine qHS  --Continue to wean his restraints    AKI on TRZ7B (resolved) Complicated UTI Leukocytosis Nephrology and urology consulted. AKI improving with Cr 1.32 from 2.13 before foley placement. Baseline around 1.4-1.5. Leukocytosis stable at 13.6 from 13.2 yesterday but still elevated. Urine color normal without blood.   -UA 10/23 showed large leukocytes. Urine culture grew >100,000 staph epidermidis. Patient s/p 3 days  of IV rocephin.  Will continue to monitor for signs of infection.  -Voiding trial 12/25 started with repeat q6hr bladder scans.   Atrial Fibrillation Chronic -Continue Eliquis  Macrocytic anemia Patient has hgb that is currently stable at 10.3 on 06/10/21. MCV was elevated and folic acid resulted low at 5.5. VB12 in normal range.  -Continue folic acid.  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  We will continue to monitor and continue metoprolol 25 mg.  Currently spironolactone and Delene Loll are held. Entresto and spironolactone currently held. Will continue to hold as pressure are normotensive and PO intake is not consistent.  - Monitor volume status, euvolemic to mildly hypovolemic on exam 06/10/21. Continue to encourage PO intake as above. - Continue metoprolol at 25 mg  Constipation (resolved) -Continue Miralax and Senna b-Continue to monitor  Hypokalemia, resolved K of 3.8 on 10/26 am.   -Continue to monitor  Diet:  dysphagia 3 IVF: None,None VTE: Eliquis Code: Full PT/OT recs: SNF for Subacute PT,. TOC recs: Pending Placement/needs further medical workup Family Update: Updated wife on 06/02/21, family at bedside.   Dispo: Anticipated discharge to Skilled nursing facility pending discontinuation of restraints and need for sedating medications.  [name], MD Tillie Rung. [facility] Internal Medicine Residency, PGY-1  [phone] On weekends and after 5 pm call the on-call pager [phone].

## 2021-06-10 NOTE — Progress Notes (Signed)
Initial Nutrition Assessment  DOCUMENTATION CODES:  Non-severe (moderate) malnutrition in context of chronic illness  INTERVENTION:  -continue Ensure Enlive po TID, each supplement provides 350 kcal and 20 grams of protein -continue Magic cup TID with meals, each supplement provides 290 kcal and 9 grams of protein -continue MVI with minerals daily -continue feeding assistance with meals.  NUTRITION DIAGNOSIS:  Moderate Malnutrition related to chronic illness (dementia) as evidenced by moderate fat depletion, moderate muscle depletion. ongoing  GOAL:  Patient will meet greater than or equal to 90% of their needs progressing  MONITOR:  PO intake, Supplement acceptance, Labs, Weight trends, I & O's  REASON FOR ASSESSMENT:  Consult Assessment of nutrition requirement/status  ASSESSMENT:  75 yo male with a PMH of  A. 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  Pt much more alert and interactive though continues to be disoriented 2/2 dementia; pt oriented to self only. Continues to be awaiting discharge to SNF pending discontinuation of restraints and need for sedating medications.  Pt's appetite has improved but continues to be varied and inadequate to meet his nutritional needs. Last 8 meals documented as 0-95% completion (40% average meal intake). Pt with orders for Ensure  and Magic Cup TID and, per RN, typically does well with supplements. Continue current nutrition plan of care at this time.   UOP: 227ml documented x24 hours I/O: -3743ml since admit  Admit wt: 82.9 kg Current wt: 85.3 kg  Medications: folvite, SSI, remeron, semglee, mvi with minerals, senokot-s, IV abx, miralax Labs: BUN 33 (H), Cr 1.34 (H), PO4 2.0 (L) CBGs 163-225 x 24 hours  Diet Order:   Diet Order             DIET DYS 3 Room service appropriate? No; Fluid consistency: Thin  Diet effective 1000                   EDUCATION NEEDS:  Not appropriate for education at this time  Skin:  Skin Assessment: Reviewed RN Assessment  Last BM:  10/25  Height:  Ht Readings from Last 1 Encounters:  06/06/21 6\' 2"  (1.88 m)   Weight:  Wt Readings from Last 1 Encounters:  06/10/21 85.3 kg   BMI:  Body mass index is 24.14 kg/m.  Estimated Nutritional Needs:  Kcal:  2000-2200 Protein:  115-130 grams Fluid:  >2 L   Larkin Ina, MS, RD, LDN (she/her/hers) RD pager number and weekend/on-call pager number located in [place].]

## 2021-06-10 NOTE — Progress Notes (Signed)
Even with posey soft belt restraint intact pt still has been climbing part way out of the foot of bed and out of the recliner earlier tonight

## 2021-06-11 DIAGNOSIS — I639 Cerebral infarction, unspecified: Secondary | ICD-10-CM | POA: Diagnosis not present

## 2021-06-11 LAB — GLUCOSE, CAPILLARY
Glucose-Capillary: 121 mg/dL — ABNORMAL HIGH (ref 70–99)
Glucose-Capillary: 230 mg/dL — ABNORMAL HIGH (ref 70–99)
Glucose-Capillary: 132 mg/dL — ABNORMAL HIGH (ref 70–99)
Glucose-Capillary: 197 mg/dL — ABNORMAL HIGH (ref 70–99)

## 2021-06-11 LAB — RENAL FUNCTION PANEL
Albumin: 3.4 g/dL — ABNORMAL LOW (ref 3.5–5.0)
Anion gap: 8 (ref 5–15)
BUN: 28 mg/dL — ABNORMAL HIGH (ref 8–23)
CO2: 24 mmol/L (ref 22–32)
Calcium: 9.2 mg/dL (ref 8.9–10.3)
Chloride: 102 mmol/L (ref 98–111)
Creatinine, Ser: 1.3 mg/dL — ABNORMAL HIGH (ref 0.61–1.24)
GFR, Estimated: 57 mL/min — ABNORMAL LOW (ref >60)
Glucose, Bld: 115 mg/dL — ABNORMAL HIGH (ref 70–99)
Phosphorus: 2.4 mg/dL — ABNORMAL LOW (ref 2.5–4.6)
Potassium: 3.5 mmol/L (ref 3.5–5.1)
Sodium: 134 mmol/L — ABNORMAL LOW (ref 135–145)

## 2021-06-11 LAB — CBC WITH DIFFERENTIAL/PLATELET
Abs Immature Granulocytes: 0.1 10*3/uL — ABNORMAL HIGH (ref 0.00–0.07)
Basophils Absolute: 0 10*3/uL (ref 0.0–0.1)
Basophils Relative: 0 %
Eosinophils Absolute: 0.4 10*3/uL (ref 0.0–0.5)
Eosinophils Relative: 3 %
HCT: 34.1 % — ABNORMAL LOW (ref 39.0–52.0)
Hemoglobin: 11.6 g/dL — ABNORMAL LOW (ref 13.0–17.0)
Lymphocytes Relative: 23 %
Lymphs Abs: 3.4 10*3/uL (ref 0.7–4.0)
MCH: 34 pg (ref 26.0–34.0)
MCHC: 34 g/dL (ref 30.0–36.0)
MCV: 100 fL (ref 80.0–100.0)
Monocytes Absolute: 0.1 10*3/uL (ref 0.1–1.0)
Monocytes Relative: 1 %
Myelocytes: 1 %
Neutro Abs: 10.7 10*3/uL — ABNORMAL HIGH (ref 1.7–7.7)
Neutrophils Relative %: 72 %
Platelets: 358 10*3/uL (ref 150–400)
RBC: 3.41 MIL/uL — ABNORMAL LOW (ref 4.22–5.81)
RDW: 13.3 % (ref 11.5–15.5)
WBC: 14.9 10*3/uL — ABNORMAL HIGH (ref 4.0–10.5)
nRBC: 0 % (ref 0.0–0.2)
nRBC: 0 /100 WBC

## 2021-06-11 MED ORDER — QUETIAPINE FUMARATE 50 MG PO TABS
50.0000 mg | ORAL_TABLET | Freq: Every day | ORAL | Status: DC
  Administered 2021-06-11 – 2021-06-24 (×14): 50 mg via ORAL
  Filled 2021-06-11 (×16): qty 1

## 2021-06-11 MED ORDER — RIVASTIGMINE 9.5 MG/24HR TD PT24
9.5000 mg | MEDICATED_PATCH | Freq: Every day | TRANSDERMAL | Status: DC
  Administered 2021-06-12: 9.5 mg via TRANSDERMAL
  Filled 2021-06-11: qty 1

## 2021-06-11 NOTE — Progress Notes (Signed)
MD ordered urethral catheter to be placed. 16Fr non latex catheter inserted without any difficulty with 600 ml yellow urine returned in urinary bag. Pt abdomen is less distended and not showing any signs of pain or discomfort. Staff will continue to monitor.

## 2021-06-11 NOTE — Progress Notes (Addendum)
HD#15 Subjective:  Overnight Events: NAEON; In and out cath was done yesterday evening with output of 600.  Patient denied any acute complaints.  Patient was requiring soft wrist restraints this morning. Mentation still same as yesterday. Initially when I examined the patient, he did not know where he was and when I asked him the year he stated "22 22". I oriented him and when I spoke with him later he was able to tell me he was at [facility], and the year was 2022 but was not able to tell me why he was here.     Objective:  Vital signs in last 24 hours: Vitals:   06/11/21 0559 06/11/21 0700 06/11/21 0812 06/11/21 1215  BP:   125/88 122/86  Pulse:   69 (!) 56  Resp:   16 16  Temp:   98.5 F (36.9 C) (!) 97.5 F (36.4 C)  TempSrc:   Oral Oral  SpO2:   99% 100%  Weight: 87.3 kg 86.9 kg    Height:       Supplemental O2: Room Air SpO2: 100 %   Physical Exam:   Physical Exam General: NAD, patient resting in recliner. Head: Normocephalic without scalp lesions.  Eyes: Conjunctivae pink, sclerae white, without jaundice.  Mouth: Moist mucus membrane. Neck: Neck supple with full range of motion (ROM).  Lungs: CTAB, no wheeze, rhonchi or rales.  Cardiovascular: regular rate, irregular rhythm. Abdomen: No TTP, normal bowel sounds MSK: No asymmetry or muscle atrophy. Knees normal without any swelling or TTP. No LE edema  Skin: warm, dry, good skin turgor, no lesions Neuro: Alert and but not oriented. CN grossly intact Psych: Normal mood and normal affect   Intake/Output Summary (Last 24 hours) at 06/11/2021 1419 Last data filed at 06/11/2021 1300 Gross per 24 hour  Intake 480 ml  Output 1201 ml  Net -721 ml   Net IO Since Admission: -4,512.49 mL [06/11/21 1419]  Pertinent Labs: CBC Latest Ref Rng & Units 06/11/2021 06/10/2021 06/09/2021  WBC 4.0 - 10.5 K/uL 14.9(H) 13.6(H) 13.2(H)  Hemoglobin 13.0 - 17.0 g/dL 11.6(L) 10.3(L) 10.7(L)  Hematocrit 39.0 - 52.0 % 34.1(L)  31.3(L) 33.7(L)  Platelets 150 - 400 K/uL 358 321 299   CMP Latest Ref Rng & Units 06/11/2021 06/10/2021 06/09/2021  Glucose 70 - 99 mg/dL 115(H) 173(H) 163(H)  BUN 8 - 23 mg/dL 28(H) 33(H) 34(H)  Creatinine 0.61 - 1.24 mg/dL 1.30(H) 1.34(H) 1.32(H)  Sodium 135 - 145 mmol/L 134(L) 136 138  Potassium 3.5 - 5.1 mmol/L 3.5 3.8 4.0  Chloride 98 - 111 mmol/L 102 103 106  CO2 22 - 32 mmol/L 24 25 25   Calcium 8.9 - 10.3 mg/dL 9.2 9.0 9.0  Total Protein 6.5 - 8.1 g/dL - - -  Total Bilirubin 0.3 - 1.2 mg/dL - - -  Alkaline Phos 38 - 126 U/L - - -  AST 15 - 41 U/L - - -  ALT 0 - 44 U/L - - -    Imaging: No results found.  Assessment/Plan:   Principal Problem:   Acute CVA (cerebrovascular accident) ([place]) Active Problems:   TIA (transient ischemic attack)   Malnutrition of moderate degree  Patient Summary: [name] [name] is a 75 y.o. with a pertinent PMH of A. 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   Acute lacunar infarct secondary  to small vessel disease -Working towards SNF placement; patient has SNF lined up but needs to be cleared from SNF. -Home medications include metoprolol, entresto, and spironolactone.   -Entresto and spironolactone currently held. Will continue to hold as pressure are normotensive and PO intake is not consistent. 06/11/21 -High intensity statin, atorvastatin 80 mg daily -Continue working on risk management (HTN, T2DM)   Acute delirium due to hospitalization with underlying dementia, UTI, and urinary obstruction Improvement in mentation following relief of urinary obstruction and treatment for possible UTI. Possible that this is the patient's baseline and we have seen a change since onset. We will attempt to slowly remove restraints as it is safe per patient. Improved dysarthria. Will update wife today.  - Seroquel 25 mg qhs  increased to 50 mg qhs, Rivastigmine increased to 9.5 mg/24 hrs --Remeron continued at current dose of 7.5 mg --Continue to wean his restraints    AKI on SAY3K (resolved) Complicated UTI Leukocytosis Nephrology and urology consulted for UA showing evidence of hematuria, proteinuria 10/17. Foley placed for urinary retention, hematuria thought to be from bladder distention vs UTI. Ciprofloxacin started 10/18 for possible UTI. Urine culture with "multiple species". Ciprofloxacin discontinued 10/21. Mental status subsequently improved and AKI improving with Cr 1.30 from 2.13 before foley placement. Baseline around 1.4-1.5.  UA 10/23 obtained for leukocytosis showed large leukocytes, unclear site of collection. Patient otherwise clinically well at that time. Urine culture grew >100,000 staph epidermidis. Patient received 2 days of IV rocephin. Remains afebrile, HDS, and improved mentation off antibiotics. Will continue to monitor for signs of infection. Voiding trial 10/25 started with repeat q6hr bladder scans. Foley inserted as patient was unable to void. 06/11/21 -Continue to monitor  Atrial Fibrillation Chronic -Continue Eliquis  Macrocytic anemia Patient has hgb that is currently stable at 11.6 on 06/11/21. MCV was elevated and folic acid resulted low at 5.5. VB12 in normal range.  -Continue folic acid.  -Continue to monitor -Replete folic acid before discharge.  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  We will continue to monitor and continue metoprolol 25 mg.  Currently spironolactone and Delene Loll are held. Entresto and spironolactone currently held. Will continue to hold as pressure are normotensive and PO intake is not consistent 06/11/21.  - Monitor volume status, euvolemic to mildly hypovolemic on exam 06/11/21. Continue to encourage PO intake as above. - Continue metoprolol at 25 mg  Constipation (resolved) -Continue  Miralax and Senna b-Continue to monitor  Hypokalemia, resolved K of 3.5 on 10/26 am.   -Continue to monitor  Diet:  dysphagia 3 IVF: None,None VTE: Eliquis Code: Full PT/OT recs: SNF for Subacute PT,. TOC recs: Pending Placement/needs further medical workup Family Update: Updated wife on 06/02/21, family at bedside.   Dispo: Anticipated discharge to Skilled nursing facility pending discontinuation of restraints and need for sedating medications.  [name], MD [place]. [facility] Internal Medicine Residency, PGY-1  [phone] On weekends and after 5 pm call the on-call pager [phone].

## 2021-06-11 NOTE — Progress Notes (Signed)
Physical Therapy Treatment Patient Details Name: [name] [name] MRN: [id number] DOB: 11-Mar-1946 Today's Date: 06/11/2021   History of Present Illness [name] is a 75 y/o male admitted 05/25/21 with c/o left leg weakness. [name] has dementia with short-term memory deficits at baseline. 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 PMH: A. 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    PT Comments    Pt remains confused and only oriented to self. 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 Pt was able to amb 35' with RW today and minAx2. Acute PT to cont to follow.    Recommendations for follow up therapy are one component of a multi-disciplinary discharge planning process, led by the attending physician.  Recommendations may be updated based on patient status, additional functional criteria and insurance authorization.  Follow Up Recommendations  Skilled nursing-short term rehab (<3 hours/day)     Assistance Recommended at Discharge Frequent or constant Supervision/Assistance  Equipment Recommendations   (TBD at next facility)    Recommendations for Other Services       Precautions / Restrictions Precautions Precautions: Fall Precaution Comments: confused, posey belt and bilat wrist restraints Restrictions Weight Bearing Restrictions: No     Mobility  Bed Mobility Overal bed mobility: Needs Assistance Bed Mobility: Supine to Sit     Supine to sit: Min assist     General bed mobility comments: minA for tactile cues to complete, minA for trunk elevation to stay on task    Transfers Overall transfer level: Needs assistance Equipment used: Rolling walker (2 wheels) Transfers: Sit to/from Stand Sit to Stand: Mod assist;+2 safety/equipment           General transfer comment: pt requiring max directional  verbal cues for safe hand and foot placement. Pt requiring modA to power up from elevated surface height and to maintain balance during transition of hands, pt with R lateral lean and sat back down on the bed, pt better with 2nd trial    Ambulation/Gait Ambulation/Gait assistance: Min assist;+2 physical assistance Gait Distance (Feet): 35 Feet Assistive device: Rolling walker (2 wheels) Gait Pattern/deviations: Step-through pattern;Decreased stride length;Trunk flexed Gait velocity: dec Gait velocity interpretation: <1.8 ft/sec, indicate of risk for recurrent falls General Gait Details: pt requiring minA to keep walker moving forward, pt with short step height and length, pt with progressive R knee flexion and lateral lean into therapist, pt not responsive to verbal cues to correct   Stairs             Wheelchair Mobility    Modified Rankin (Stroke Patients Only) Modified Rankin (Stroke Patients Only) Pre-Morbid Rankin Score: No significant disability Modified Rankin: Moderately severe disability     Balance Overall balance assessment: Needs assistance Sitting-balance support: Feet supported;No upper extremity supported Sitting balance-Leahy Scale: Fair     Standing balance support: Bilateral upper extremity supported;During functional activity Standing balance-Leahy Scale: Poor Standing balance comment: dependent on physical assist                            Cognition Arousal/Alertness: Awake/alert Behavior During Therapy: Impulsive Overall Cognitive Status: Impaired/Different from baseline Area of Impairment: Orientation;Memory;Attention;Following commands;Safety/judgement;Awareness;Problem solving                 Orientation Level: Disoriented to;Place;Time;Situation (stated [place]) Current Attention Level: Focused (easily  distracted, tangential) Memory: Decreased recall of precautions;Decreased short-term memory Following Commands: Follows one step  commands with increased time;Follows one step commands inconsistently Safety/Judgement: Decreased awareness of safety;Decreased awareness of deficits (in posey belt and bilat wrist restraints) Awareness: Emergent Problem Solving: Slow processing;Decreased initiation;Requires verbal cues;Requires tactile cues General Comments: pt conversant but not necessarily appropriate in regard to therapy, pt tangential and 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        Exercises      General Comments General comments (skin integrity, edema, etc.): VSS      Pertinent Vitals/Pain Pain Assessment: Faces Faces Pain Scale: No hurt    Home Living                          Prior Function            PT Goals (current goals can now be found in the care plan section) Progress towards PT goals: Progressing toward goals    Frequency    Min 3X/week      PT Plan Current plan remains appropriate    Co-evaluation              AM-PAC PT "6 Clicks" Mobility   Outcome Measure  Help needed turning from your back to your side while in a flat bed without using bedrails?: A Little Help needed moving from lying on your back to sitting on the side of a flat bed without using bedrails?: A Little Help needed moving to and from a bed to a chair (including a wheelchair)?: A Lot Help needed standing up from a chair using your arms (e.g., wheelchair or bedside chair)?: A Lot Help needed to walk in hospital room?: A Lot Help needed climbing 3-5 steps with a railing? : Total 6 Click Score: 13    End of Session         PT Visit Diagnosis: Other abnormalities of gait and mobility (R26.89);Hemiplegia and hemiparesis;Muscle weakness (generalized) (M62.81) Hemiplegia - Right/Left: Left Hemiplegia - dominant/non-dominant: Non-dominant Hemiplegia - caused by: Cerebral infarction     Time: 0932-1006 PT Time Calculation (min) (ACUTE ONLY): 34 min  Charges:  $Gait  Training: 8-22 mins $Therapeutic Activity: 8-22 mins                     Kittie Plater, PT, DPT Acute Rehabilitation Services Pager #: [phone] Office #: [phone]    Berline Lopes 06/11/2021, 10:55 AM

## 2021-06-11 NOTE — Plan of Care (Signed)

## 2021-06-11 NOTE — Progress Notes (Signed)
Pt is sitting up at the end of the bed trying to get out of bed. Posey belt is in place. Monitoring pt so he will remain safe from injury. Pt is irritated stating that he is going home. Pulling on indwelling catheter. Explaining to pt that he should leave his catheter alone. Pt is alert to self only.  Pt bed is in the lowest position and bed alarm is set. Staff will continue to monitor.

## 2021-06-12 DIAGNOSIS — I639 Cerebral infarction, unspecified: Secondary | ICD-10-CM | POA: Diagnosis not present

## 2021-06-12 LAB — GLUCOSE, CAPILLARY
Glucose-Capillary: 137 mg/dL — ABNORMAL HIGH (ref 70–99)
Glucose-Capillary: 172 mg/dL — ABNORMAL HIGH (ref 70–99)
Glucose-Capillary: 158 mg/dL — ABNORMAL HIGH (ref 70–99)
Glucose-Capillary: 197 mg/dL — ABNORMAL HIGH (ref 70–99)

## 2021-06-12 LAB — CBC WITH DIFFERENTIAL/PLATELET
Abs Immature Granulocytes: 0 10*3/uL (ref 0.00–0.07)
Basophils Absolute: 0.1 10*3/uL (ref 0.0–0.1)
Basophils Relative: 1 %
Eosinophils Absolute: 0.4 10*3/uL (ref 0.0–0.5)
Eosinophils Relative: 3 %
HCT: 32.2 % — ABNORMAL LOW (ref 39.0–52.0)
Hemoglobin: 10.5 g/dL — ABNORMAL LOW (ref 13.0–17.0)
Lymphocytes Relative: 30 %
Lymphs Abs: 4.1 10*3/uL — ABNORMAL HIGH (ref 0.7–4.0)
MCH: 33.1 pg (ref 26.0–34.0)
MCHC: 32.6 g/dL (ref 30.0–36.0)
MCV: 101.6 fL — ABNORMAL HIGH (ref 80.0–100.0)
Monocytes Absolute: 0.1 10*3/uL (ref 0.1–1.0)
Monocytes Relative: 1 %
Neutro Abs: 9 10*3/uL — ABNORMAL HIGH (ref 1.7–7.7)
Neutrophils Relative %: 65 %
Platelets: 354 10*3/uL (ref 150–400)
RBC: 3.17 MIL/uL — ABNORMAL LOW (ref 4.22–5.81)
RDW: 13.2 % (ref 11.5–15.5)
WBC: 13.8 10*3/uL — ABNORMAL HIGH (ref 4.0–10.5)
nRBC: 0 /100 WBC
nRBC: 0.1 % (ref 0.0–0.2)

## 2021-06-12 LAB — RENAL FUNCTION PANEL
Albumin: 3.1 g/dL — ABNORMAL LOW (ref 3.5–5.0)
Anion gap: 9 (ref 5–15)
BUN: 28 mg/dL — ABNORMAL HIGH (ref 8–23)
CO2: 21 mmol/L — ABNORMAL LOW (ref 22–32)
Calcium: 8.8 mg/dL — ABNORMAL LOW (ref 8.9–10.3)
Chloride: 104 mmol/L (ref 98–111)
Creatinine, Ser: 1.35 mg/dL — ABNORMAL HIGH (ref 0.61–1.24)
GFR, Estimated: 54 mL/min — ABNORMAL LOW (ref >60)
Glucose, Bld: 152 mg/dL — ABNORMAL HIGH (ref 70–99)
Phosphorus: 2.4 mg/dL — ABNORMAL LOW (ref 2.5–4.6)
Potassium: 4 mmol/L (ref 3.5–5.1)
Sodium: 134 mmol/L — ABNORMAL LOW (ref 135–145)

## 2021-06-12 MED ORDER — RIVASTIGMINE 4.6 MG/24HR TD PT24
4.6000 mg | MEDICATED_PATCH | Freq: Every day | TRANSDERMAL | Status: DC
  Administered 2021-06-13 – 2021-06-14 (×2): 4.6 mg via TRANSDERMAL
  Filled 2021-06-12 (×2): qty 1

## 2021-06-12 MED ORDER — METOPROLOL SUCCINATE ER 25 MG PO TB24
25.0000 mg | ORAL_TABLET | Freq: Every day | ORAL | Status: DC
  Administered 2021-06-14: 25 mg via ORAL
  Filled 2021-06-12 (×2): qty 1

## 2021-06-12 NOTE — Progress Notes (Signed)
Occupational Therapy Treatment Patient Details Name: [name] [name] MRN: [id number] DOB: [birth year] Today's Date: 06/12/2021   History of present illness [name] is a 75 y/o male admitted 05/25/21 with c/o left leg weakness. [name] has dementia with short-term memory deficits at baseline. 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 PMH: A. 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   OT comments  Pt making incremental progress with OT goals this session. Physically, pt able to complete ADL's and mobility with min guard to min A for safety. Pt continues to require increased assist due to decreased cognition. Additionally, pt continues to be impulsive, requiring verbal and tactile cues. OT will continue to follow acutely.    Recommendations for follow up therapy are one component of a multi-disciplinary discharge planning process, led by the attending physician.  Recommendations may be updated based on patient status, additional functional criteria and insurance authorization.    Follow Up Recommendations  Skilled nursing-short term rehab (<3 hours/day)    Assistance Recommended at Discharge Frequent or constant Supervision/Assistance  Equipment Recommendations  None recommended by OT    Recommendations for Other Services      Precautions / Restrictions Precautions Precautions: Fall Precaution Comments: confused, posey belt Restrictions Weight Bearing Restrictions: No       Mobility Bed Mobility               General bed mobility comments: In recliner    Transfers Overall transfer level: Needs assistance Equipment used: Rolling walker (2 wheels) Transfers: Sit to/from Stand Sit to Stand: Min guard;Min assist           General transfer comment: Min A from low toilet, Min guard from recliner.     Balance Overall balance assessment: Needs assistance Sitting-balance support:  Feet supported;No upper extremity supported Sitting balance-Leahy Scale: Good     Standing balance support: Bilateral upper extremity supported;During functional activity Standing balance-Leahy Scale: Poor Standing balance comment: dependent on physical assist                           ADL either performed or assessed with clinical judgement   ADL Overall ADL's : Needs assistance/impaired Eating/Feeding: Independent;Sitting Eating/Feeding Details (indicate cue type and reason): Pt set up and fed himself with no difficulty this session Grooming: Wash/dry hands;Standing;Supervision/safety Grooming Details (indicate cue type and reason): completed standing at sink                 Toilet Transfer: Min guard;Ambulation Toilet Transfer Details (indicate cue type and reason): Pt completed toileting thi ssession Toileting- Clothing Manipulation and Hygiene: Minimal assistance;Sitting/lateral lean;Sit to/from stand Toileting - Clothing Manipulation Details (indicate cue type and reason): in bathroom, min A for thoroughness and safety     Functional mobility during ADLs: Min guard;Rolling walker (2 wheels) General ADL Comments: pt mostly limited by poor cognition this session; he is physically improving wtih strength and ambulation.     Vision       Perception Perception Perception: Not tested   Praxis Praxis Praxis: Intact    Cognition Arousal/Alertness: Awake/alert Behavior During Therapy: Impulsive Overall Cognitive Status: No family/caregiver present to determine baseline cognitive functioning                                 General Comments: Pt talkative this session,  very impulsive, not aware of where he is at or why he is in the hospital.          Exercises     Shoulder Instructions       General Comments      Pertinent Vitals/ Pain       Pain Assessment: No/denies pain  Home Living                                           Prior Functioning/Environment              Frequency  Min 2X/week        Progress Toward Goals  OT Goals(current goals can now be found in the care plan section)  Progress towards OT goals: Progressing toward goals  Acute Rehab OT Goals OT Goal Formulation: With patient Potential to Achieve Goals: Fair ADL Goals Pt Will Perform Grooming: with min guard assist;standing Pt Will Perform Lower Body Bathing: with min assist;sit to/from stand Pt Will Perform Lower Body Dressing: with min guard assist;sit to/from stand Pt Will Transfer to Toilet: with min guard assist;ambulating Pt Will Perform Toileting - Clothing Manipulation and hygiene: with min guard assist;sitting/lateral leans Pt Will Perform Tub/Shower Transfer: with min guard assist;ambulating;tub bench  Plan Discharge plan remains appropriate;Frequency remains appropriate    Co-evaluation                 AM-PAC OT "6 Clicks" Daily Activity     Outcome Measure   Help from another person eating meals?: None Help from another person taking care of personal grooming?: A Little Help from another person toileting, which includes using toliet, bedpan, or urinal?: A Little Help from another person bathing (including washing, rinsing, drying)?: A Lot Help from another person to put on and taking off regular upper body clothing?: A Little Help from another person to put on and taking off regular lower body clothing?: A Lot 6 Click Score: 17    End of Session Equipment Utilized During Treatment: Rolling walker (2 wheels);Gait belt  OT Visit Diagnosis: Unsteadiness on feet (R26.81);Other abnormalities of gait and mobility (R26.89);Muscle weakness (generalized) (M62.81);Repeated falls (R29.6);History of falling (Z91.81);Pain   Activity Tolerance Patient tolerated treatment well   Patient Left in bed;with call bell/phone within reach;with bed alarm set;with restraints reapplied   Nurse Communication  Mobility status        Time: 1017-5102 OT Time Calculation (min): 18 min  Charges: OT General Charges $OT Visit: 1 Visit OT Treatments $Self Care/Home Management : 8-22 mins  Amor Hyle H., OTR/L Acute Rehabilitation  Emerson Schreifels Elane Yolanda Bonine 06/12/2021, 6:56 PM

## 2021-06-12 NOTE — Progress Notes (Addendum)
HD#16 Subjective:  Overnight Events: Restraints placed.  Denied any concerns. Stated it was 2023 and he was in the hospital. Was not able to tell me why he was in the hospital.     Objective:  Vital signs in last 24 hours: Vitals:   06/11/21 1624 06/11/21 2024 06/11/21 2344 06/12/21 0353  BP: (!) 140/100 (!) 132/92 (!) 122/98 138/83  Pulse: 94 94 98 85  Resp: 18 20 16 16   Temp: (!) 97.5 F (36.4 C) 98.5 F (36.9 C) 98.5 F (36.9 C) 98.4 F (36.9 C)  TempSrc: Oral Oral Oral Oral  SpO2: 100% 100% 100% 99%  Weight:      Height:       Supplemental O2: Room Air SpO2: 99 %   Physical Exam:    Physical Exam General:  NAD Head: Normocephalic without scalp lesions.  Eyes: Conjunctivae pink, sclerae white, without jaundice.  Mouth: moist mucus membrane. . Neck: Neck supple with full range of motion (ROM).  Lungs: CTAB, no wheeze, rhonchi or rales.  Cardiovascular: regular rate, irregular rhythm.  Abdomen: No TTP, normal bowel sounds MSK: No asymmetry or muscle atrophy. Full ROM, 5/5 strength in all extremities.  Neuro: Alert and but not oriented. CN grossly intact Psych: Normal mood and normal affect   Intake/Output Summary (Last 24 hours) at 06/12/2021 0653 Last data filed at 06/11/2021 1800 Gross per 24 hour  Intake 720 ml  Output 651 ml  Net 69 ml    Net IO Since Admission: -4,922.49 mL [06/12/21 0653]  Pertinent Labs: CBC Latest Ref Rng & Units 06/12/2021 06/11/2021 06/10/2021  WBC 4.0 - 10.5 K/uL 13.8(H) 14.9(H) 13.6(H)  Hemoglobin 13.0 - 17.0 g/dL 10.5(L) 11.6(L) 10.3(L)  Hematocrit 39.0 - 52.0 % 32.2(L) 34.1(L) 31.3(L)  Platelets 150 - 400 K/uL 354 358 321   CMP Latest Ref Rng & Units 06/12/2021 06/11/2021 06/10/2021  Glucose 70 - 99 mg/dL 152(H) 115(H) 173(H)  BUN 8 - 23 mg/dL 28(H) 28(H) 33(H)  Creatinine 0.61 - 1.24 mg/dL 1.35(H) 1.30(H) 1.34(H)  Sodium 135 - 145 mmol/L 134(L) 134(L) 136  Potassium 3.5 - 5.1 mmol/L 4.0 3.5 3.8  Chloride 98 -  111 mmol/L 104 102 103  CO2 22 - 32 mmol/L 21(L) 24 25  Calcium 8.9 - 10.3 mg/dL 8.8(L) 9.2 9.0  Total Protein 6.5 - 8.1 g/dL - - -  Total Bilirubin 0.3 - 1.2 mg/dL - - -  Alkaline Phos 38 - 126 U/L - - -  AST 15 - 41 U/L - - -  ALT 0 - 44 U/L - - -    Imaging: No results found.  Assessment/Plan:   Principal Problem:   Acute CVA (cerebrovascular accident) ([place]) Active Problems:   TIA (transient ischemic attack)   Malnutrition of moderate degree  Patient Summary: [name] [name] is a 75 y.o. with a pertinent PMH of A. 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 -Home medications include metoprolol, entresto, and spironolactone.   -Entresto and spironolactone currently held. Will continue to hold as pressure are normotensive and PO intake is not consistent. 06/12/21 -High intensity statin, atorvastatin 80 mg daily -Continue working on risk management (HTN, T2DM)   Acute delirium due to hospitalization  with underlying dementia, UTI, and urinary obstruction Improvement in mentation following relief of urinary obstruction and treatment for possible UTI. Possible that this is the patient's baseline and we have seen a change since onset. We will attempt to slowly remove restraints as it is safe per patient. Improved dysarthria. Will update wife 06/12/21.  - Seroquel 25 mg qhs increased to 50 mg qhs, Rivastigmine decreased back to 4.6 mg/24 hrs --Remeron continued at current dose of 7.5 mg --Continue to wean his restraints   Urinary Retention  AKI on CKD3a (resolved) Possible UTI (resolved) AKI improved with Cr 1.35 from 2.13 before foley placement. Baseline around 1.4-1.5.  UA 10/23 obtained for  leukocytosis showed large leukocytes, unclear site of collection. Patient otherwise clinically well at that time. Urine culture grew >100,000 staph epidermidis. Patient is s/p 3 days of IV Rocephin. Remains afebrile, HDS, and improved mentation off antibiotics, leukocytosis improving 10/28. Will continue to monitor for signs of infection. Voiding trial 10/25 started with repeat q6hr bladder scans. Foley inserted as patient was unable to void. 06/11/21 -Continue to monitor -Continue foley catheter -Strict I&Os -Consider BPH work up and flomax trial   Atrial Fibrillation Chronic -Continue Eliquis  Macrocytic anemia Patient has hgb that is currently stable at 10.5 on 06/12/21. MCV was elevated and folic acid resulted low at 5.5. VB12 in normal range.  -Continue folic acid.  -Continue to monitor -Replete folic acid before discharge.  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  We will continue to monitor and continue metoprolol 25 mg.  Currently spironolactone and Delene Loll are held. Entresto and spironolactone currently held. Will continue to hold as pressure are normotensive and PO intake is not consistent 06/11/21.  - Monitor volume status, euvolemic to mildly hypovolemic on exam 06/11/21. Continue to encourage PO intake as above. - Will hold metoprolol at 25 mg due to bradycardia  Constipation (resolved) -Continue Miralax and Senna b -Continue to monitor  Hypokalemia, resolved K of 4.0 on 10/28 am.   -Continue to monitor  Diet:  dysphagia 3 IVF: None,None VTE: Eliquis Code: Full PT/OT recs: SNF for Subacute PT,. TOC recs: Pending Placement/needs further medical workup Family Update: Updated wife on 06/02/21, family at bedside.   Dispo: Anticipated discharge to Skilled nursing facility pending discontinuation of restraints and need for sedating medications.  [name], MD Tillie Rung. [facility] Internal  Medicine Residency, PGY-1  [phone] On weekends and after 5 pm call the on-call pager [phone].

## 2021-06-13 LAB — RENAL FUNCTION PANEL
Albumin: 2.9 g/dL — ABNORMAL LOW (ref 3.5–5.0)
Anion gap: 8 (ref 5–15)
BUN: 26 mg/dL — ABNORMAL HIGH (ref 8–23)
CO2: 22 mmol/L (ref 22–32)
Calcium: 8.7 mg/dL — ABNORMAL LOW (ref 8.9–10.3)
Chloride: 105 mmol/L (ref 98–111)
Creatinine, Ser: 1.36 mg/dL — ABNORMAL HIGH (ref 0.61–1.24)
GFR, Estimated: 54 mL/min — ABNORMAL LOW (ref >60)
Glucose, Bld: 126 mg/dL — ABNORMAL HIGH (ref 70–99)
Phosphorus: 2.8 mg/dL (ref 2.5–4.6)
Potassium: 3.3 mmol/L — ABNORMAL LOW (ref 3.5–5.1)
Sodium: 135 mmol/L (ref 135–145)

## 2021-06-13 LAB — CBC WITH DIFFERENTIAL/PLATELET
Abs Immature Granulocytes: 0.06 10*3/uL (ref 0.00–0.07)
Basophils Absolute: 0 10*3/uL (ref 0.0–0.1)
Basophils Relative: 0 %
Eosinophils Absolute: 0.1 10*3/uL (ref 0.0–0.5)
Eosinophils Relative: 1 %
HCT: 28 % — ABNORMAL LOW (ref 39.0–52.0)
Hemoglobin: 9.6 g/dL — ABNORMAL LOW (ref 13.0–17.0)
Immature Granulocytes: 1 %
Lymphocytes Relative: 42 %
Lymphs Abs: 5.1 10*3/uL — ABNORMAL HIGH (ref 0.7–4.0)
MCH: 34 pg (ref 26.0–34.0)
MCHC: 34.3 g/dL (ref 30.0–36.0)
MCV: 99.3 fL (ref 80.0–100.0)
Monocytes Absolute: 0.3 10*3/uL (ref 0.1–1.0)
Monocytes Relative: 2 %
Neutro Abs: 6.7 10*3/uL (ref 1.7–7.7)
Neutrophils Relative %: 54 %
Platelets: 305 10*3/uL (ref 150–400)
RBC: 2.82 MIL/uL — ABNORMAL LOW (ref 4.22–5.81)
RDW: 13.2 % (ref 11.5–15.5)
WBC: 12.2 10*3/uL — ABNORMAL HIGH (ref 4.0–10.5)
nRBC: 0 % (ref 0.0–0.2)

## 2021-06-13 LAB — GLUCOSE, CAPILLARY
Glucose-Capillary: 133 mg/dL — ABNORMAL HIGH (ref 70–99)
Glucose-Capillary: 181 mg/dL — ABNORMAL HIGH (ref 70–99)
Glucose-Capillary: 133 mg/dL — ABNORMAL HIGH (ref 70–99)
Glucose-Capillary: 218 mg/dL — ABNORMAL HIGH (ref 70–99)

## 2021-06-13 LAB — MAGNESIUM: Magnesium: 1.8 mg/dL (ref 1.7–2.4)

## 2021-06-13 MED ORDER — POTASSIUM CHLORIDE 20 MEQ PO PACK
40.0000 meq | PACK | Freq: Two times a day (BID) | ORAL | Status: AC
  Administered 2021-06-13 (×2): 40 meq via ORAL
  Filled 2021-06-13 (×2): qty 2

## 2021-06-13 NOTE — Progress Notes (Addendum)
HD#17 Subjective:  Overnight Events: Restraints reordered.  Patient locked himself in bathroom overnight.  [name] [name] is sitting up at the edge of bed with his Foley bag in hand.  States he was just looking at it.  Reoriented him to where he has.  Placed Foley bag at the edge of the bed.  Nursing requesting sitter order be renewed.  He is in no pain at this time in no acute distress.  Objective:  Vital signs in last 24 hours: Vitals:   06/13/21 0110 06/13/21 0558 06/13/21 0558 06/13/21 0800  BP: 107/75 137/85  116/76  Pulse: 65 (!) 101  (!) 51  Resp: 20 19  19   Temp: 98.4 F (36.9 C)  (!) 97.3 F (36.3 C) 97.7 F (36.5 C)  TempSrc: Axillary  Oral Oral  SpO2: 100% 100%  100%  Weight:      Height:       Supplemental O2: Room Air SpO2: 100 %   Physical Exam:  Constitutional: No acute distress HENT: normocephalic atraumatic Eyes: conjunctiva non-erythematous. No scleral icterus.  Neck: supple Cardiovascular: Irregular irregular rhythm Pulmonary/Chest: normal work of breathing on room air MSK: normal bulk and tone Neurological: alert & oriented to person only 5/5 strenght in RLE Psych: Pleasantly confused Filed Weights   06/10/21 0535 06/11/21 0559 06/11/21 0700  Weight: 85.3 kg 87.3 kg 86.9 kg     Intake/Output Summary (Last 24 hours) at 06/13/2021 1102 Last data filed at 06/12/2021 1412 Gross per 24 hour  Intake 711 ml  Output --  Net 711 ml   Net IO Since Admission: -5,261.49 mL [06/13/21 1102]  Pertinent Labs: CBC Latest Ref Rng & Units 06/13/2021 06/12/2021 06/11/2021  WBC 4.0 - 10.5 K/uL 12.2(H) 13.8(H) 14.9(H)  Hemoglobin 13.0 - 17.0 g/dL 9.6(L) 10.5(L) 11.6(L)  Hematocrit 39.0 - 52.0 % 28.0(L) 32.2(L) 34.1(L)  Platelets 150 - 400 K/uL 305 354 358    CMP Latest Ref Rng & Units 06/13/2021 06/12/2021 06/11/2021  Glucose 70 - 99 mg/dL 126(H) 152(H) 115(H)  BUN 8 - 23 mg/dL 26(H) 28(H) 28(H)  Creatinine 0.61 - 1.24 mg/dL 1.36(H) 1.35(H) 1.30(H)   Sodium 135 - 145 mmol/L 135 134(L) 134(L)  Potassium 3.5 - 5.1 mmol/L 3.3(L) 4.0 3.5  Chloride 98 - 111 mmol/L 105 104 102  CO2 22 - 32 mmol/L 22 21(L) 24  Calcium 8.9 - 10.3 mg/dL 8.7(L) 8.8(L) 9.2  Total Protein 6.5 - 8.1 g/dL - - -  Total Bilirubin 0.3 - 1.2 mg/dL - - -  Alkaline Phos 38 - 126 U/L - - -  AST 15 - 41 U/L - - -  ALT 0 - 44 U/L - - -    Imaging: No results found.  Assessment/Plan:   Principal Problem:   Acute CVA (cerebrovascular accident) ([place]) Active Problems:   TIA (transient ischemic attack)   Malnutrition of moderate degree   Patient Summary: [name] [name] is a 75 y.o. with a pertinent PMH of A. Fib on Eliquis, HFrEF (EF 25% 05/26/21)  2/2 dilated cardiomyopathy, HTN, T2DM, and dementia who presents to the ED with c/o left leg weakness and admitted for acute lacunar CVA on hospital day 5.   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 -SNF placement -Home medications include metoprolol, entresto, and spironolactone.  -High intensity statin, atorvastatin 80 mg daily -Continue working on risk management (HTN, T2DM)   Dementia Delirium due to hospitalization Patient has not required Ativ patient still  requiring Posey belt intermittently.  Looking into geriatric psych units, patient is a veteran and may have more available options with this. -Continue to work with social work and placement, discussed palliative care with wife - Seroquel 25 mg qhs -- Remeron and rivastigmine qHS    Leukocytosis Leukocytosis improving to 12.2 from 13.8.  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  Patient with bradycardia overnight.  We will continue to monitor and hold metoprolol 25 mg.  Continue to hold spironolactone and Entresto. - Monitor volume  status - Hold metoprolol   Hypokalemia, resolved K of 3. 3.  Mag pending -K 40 mEq liquid twice daily today -repeat BMP tomorrow  Diet:  dysphagia 3 IVF: None,None VTE: Eliquis Code: Full PT/OT recs: SNF for Subacute PT,. TOC recs: Pending Placement Family Update: Updated yesterday, family at bedside.   Dispo: Anticipated discharge to Skilled nursing facility pending discontinuation of restraints and need for sedating medications.  Sanjuana Letters DO Internal Medicine Resident PGY-2 Pager [phone] Please contact the on call pager after 5 pm and on weekends at [phone].

## 2021-06-13 NOTE — Progress Notes (Signed)
0530 pt agitated restless, sitting on toilet for over and hour. Pt noted pulling at catheter over and over. Pt yelling at staff and threatining to punch staff If they continue "come into his home". Pt reoriented but pt became more aggressive and putting self at higher risk for fall and injury due to pulling at cathter.     Received order for SRW and belt. Placed on pt, pt resting.

## 2021-06-14 DIAGNOSIS — Z515 Encounter for palliative care: Secondary | ICD-10-CM

## 2021-06-14 DIAGNOSIS — Z7189 Other specified counseling: Secondary | ICD-10-CM

## 2021-06-14 LAB — GLUCOSE, CAPILLARY
Glucose-Capillary: 145 mg/dL — ABNORMAL HIGH (ref 70–99)
Glucose-Capillary: 184 mg/dL — ABNORMAL HIGH (ref 70–99)
Glucose-Capillary: 169 mg/dL — ABNORMAL HIGH (ref 70–99)

## 2021-06-14 LAB — BASIC METABOLIC PANEL
Anion gap: 4 — ABNORMAL LOW (ref 5–15)
BUN: 26 mg/dL — ABNORMAL HIGH (ref 8–23)
CO2: 23 mmol/L (ref 22–32)
Calcium: 8.5 mg/dL — ABNORMAL LOW (ref 8.9–10.3)
Chloride: 105 mmol/L (ref 98–111)
Creatinine, Ser: 1.39 mg/dL — ABNORMAL HIGH (ref 0.61–1.24)
GFR, Estimated: 53 mL/min — ABNORMAL LOW (ref >60)
Glucose, Bld: 188 mg/dL — ABNORMAL HIGH (ref 70–99)
Potassium: 3.7 mmol/L (ref 3.5–5.1)
Sodium: 132 mmol/L — ABNORMAL LOW (ref 135–145)

## 2021-06-14 LAB — CBC
HCT: 26 % — ABNORMAL LOW (ref 39.0–52.0)
Hemoglobin: 8.5 g/dL — ABNORMAL LOW (ref 13.0–17.0)
MCH: 33.1 pg (ref 26.0–34.0)
MCHC: 32.7 g/dL (ref 30.0–36.0)
MCV: 101.2 fL — ABNORMAL HIGH (ref 80.0–100.0)
Platelets: 298 10*3/uL (ref 150–400)
RBC: 2.57 MIL/uL — ABNORMAL LOW (ref 4.22–5.81)
RDW: 13.7 % (ref 11.5–15.5)
WBC: 9.9 10*3/uL (ref 4.0–10.5)
nRBC: 0 % (ref 0.0–0.2)

## 2021-06-14 MED ORDER — MAGNESIUM SULFATE 2 GM/50ML IV SOLN
2.0000 g | Freq: Once | INTRAVENOUS | Status: AC
  Administered 2021-06-14: 2 g via INTRAVENOUS
  Filled 2021-06-14: qty 50

## 2021-06-14 MED ORDER — RIVASTIGMINE 9.5 MG/24HR TD PT24
9.5000 mg | MEDICATED_PATCH | Freq: Every day | TRANSDERMAL | Status: DC
  Administered 2021-06-15: 9.5 mg via TRANSDERMAL
  Filled 2021-06-14: qty 1

## 2021-06-14 NOTE — Consult Note (Signed)
Palliative Medicine Inpatient Consult Note  Consulting Provider: Riesa Pope, MD  Reason for consult:   Palliative Care Consult Services Palliative Medicine Consult   Symptom Management Consult  Reason for Consult? Goals of care discussion with patietn's wife. Currently has severe dementia.   HPI:  Per intake H&P --> [name] [name] is a 75 y.o. with a pertinent PMH of A. 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  Palliative care has been asked to get involved to discuss goals of care in the setting of progressive dementia.  Clinical Assessment/Goals of Care:  *Please note that this is a verbal dictation therefore any spelling or grammatical errors are due to the "[employer]" system interpretation.  I have reviewed medical records including EPIC notes, labs and imaging, received report from bedside RN, assessed the patient who is lying in bed in no acute distress.  Demontay feels that we are in the [employer] and actively deployed..    I called patient's spouse, [name] [name] to further discuss diagnosis prognosis, GOC, EOL wishes, disposition and options.  [name] [name] shares with me that she is noticed more recently [name] [name] has had issues with his memory.  He has it seems good long-term memory and is able to share stories but his short-term recall is diminished.  We reviewed his additional medical history of atrial fibrillation heart failure.  We reviewed the stages of heart failure according to the [place] heart association functional classification scale.    I introduced Palliative Medicine as specialized medical care for people living with serious illness. It focuses on providing relief from the symptoms and stress of a serious illness. The goal is to improve quality of life for both the patient and the family.  [name] [name] is originally from the  [place] area.  He has lived in [place] for the greater part of his life.  He has been married 3 times.  His more recently wife  [name] [name] and he do not share any children though he has 2 daughters and 2 sons from prior relationships.  He worked in the [employer], and more recently for car auctions.  He very much enjoyed fishing and hunting in his younger years.  More recently he and his wife would enjoy doing crossword puzzles together.  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  Prior to hospitalization [name] [name] was able to participate in activities of daily living independently.  His wife shares that prior to admission he was still driving although this was not advisable he is "stubborn" and would not stop doing this. [name] [name] shares that if if the medical team does not feel this is advisable any longer they will need to speak to [place] directly about that.  A detailed discussion was had today regarding advanced directives.  We do not have any on file though patient's current spouse, [name] [name] is his Air traffic controller.  Concepts specific to code status, artifical feeding and hydration, continued IV antibiotics and rehospitalization was had.  We reviewed that in the past [name] [name] had shared with [name] [name] that he would not want prolonged measures to sustain life.  Based upon this conversation [name] [name] has elected to make [facility] a DO NOT RESUSCITATE/DO NOT INTUBATE CODE STATUS.   We discussed Lionells acute lacunar cerebrovascular accident.  We reviewed that his acute hospitalization, CVA, change in environment, alteration of circadian  rhythm, and acute illness have all been strong contributors to his prolonged course of delirium.  We reviewed that delirium is a very complicated diagnosis and can often be a prognosticator for posthospitalization mortality.  We reviewed the best case worst-case scenarios of Lionel's present condition.  We reviewed the thought of him clearing  cognitively as best as possible and being able to participate in physical therapy and Occupational Therapy.  We reviewed that unfortunately the worst case would be continued progression rapidly of his dementia more behavioral issues and more difficulty in providing care for [facility] which in the long run may require placement.  We also reviewed that in the long run should he not improve and should he get to a point where his functionality and appetite decrease that hospice would be the best consideration.  Patient's spouse is willing to have a outpatient palliative care follow along to further assist with Britian's   Right now patient's spouse is hopeful for improvements and for him to be able to rehabilitate.  She would be open to a behavioral health unit if that is the best thing for him as a next step.  Discussed the importance of continued conversation with family and their  medical providers regarding overall plan of care and treatment options, ensuring decisions are within the context of the patients values and GOCs.  Decision Maker: [name] [name] (spouse) [phone] SUMMARY OF RECOMMENDATIONS     Code Status/Advance Care Planning: DNAR/DNI   Symptom Management:  Delirium:  - Agree with Seroquel QHS could add QAM as well is needed at decreased dose of 25mg    Precautions: Safety high-visibility room low bed bed alarm fall risk wristband removal of street clothing and shoes from room  Environment Provide soothing music or the Care Channel on the TV when appropriate (during day) Remove unnecessary equipment and clutter Hide IV lines and catheters as much as possible  Orientation Call patient by preferred name Provide orientation cue using dry-erase board in patient room Obtain patient's glasses, dentures, and hearing aids from family if used and have available Encourage family to stay with patient during day as much as possible to improve orientation; encourage social  interaction and family visits during visiting hours Allow patient to have personal items from home (photos, blankets, pillows, etc) Reorient the patient frequently using the script above and explain care and activities Have a clock visible and provide frequent verbal reminders of time and date  Maintain sleep-wake cycle Limit unnecessary awakenings If clinically stable, hold temperature and blood pressure checks from 11 pm to 7 am to maintain optimal sleep and rest Turn lights off at 10 pm and back on during the day while the patient is awake Turn TV off and keep room dark and quiet between 10 pm and 7 am to prevent sleep deprivation Pull curtains open to allow sunlight in the room during daylight hours Put patient in a private or semiprivate room if possible to avoid excess noise Withhold caffeine after noon  Elimination Assist with toileting hourly during the day, then every 2 to 4 hours while awake in the evening If unable to void after 8 hours, perform bladder scan and notify physician Consider discontinuation of Foley catheter if inserted on admission Monitor for constipation. Try prevention measures such as prune juice, stewed prunes, or bran cereal if the patient is taking food by mouth For patients who do not have a Foley catheter and are drinking oral liquids, allow no liquids after 8 pm if possible  Ambulation Keep a walker and commode at bedside as indicated If patient is ambulatory, ambulate three times a day, with assistance if patient is unsteady or mentation is abnormal Assist patient out of bed to chair for all meals   Palliative Prophylaxis:  Oral Care, Mobility, Delirium precautions  Additional Recommendations (Limitations, Scope, Preferences): Continue current scope of care   Psycho-social/Spiritual:  Desire for further Chaplaincy support: Yes Additional Recommendations: Education on dementia progression in the setting of CVA and prolonged acute hospitalization    Prognosis: Cognitively I suspect will continue to decline, physically is functional and nutritionally eats fairly well  Discharge Planning: Discharge plan uncertain.   Vitals:   06/13/21 1200 06/13/21 1943  BP: 115/75 116/86  Pulse: (!) 56 90  Resp: 18 18  Temp: 97.8 F (36.6 C) 98.6 F (37 C)  SpO2: 100% 99%    Intake/Output Summary (Last 24 hours) at 06/14/2021 3382 Last data filed at 06/14/2021 0641 Gross per 24 hour  Intake 30 ml  Output 1050 ml  Net -1020 ml   Last Weight  Most recent update: 06/11/2021  7:03 AM    Weight  86.9 kg (191 lb 9.3 oz)            Gen:  Elderly M in NAD HEENT: moist mucous membranes CV: Regular rate and irregular rhythm  PULM: On RA ABD: soft/nontender  EXT: No edema  Neuro: Alert and oriented x1  PPS: 60%   This conversation/these recommendations were discussed with patient primary care team, Dr. Humphrey Rolls  Time In: 0800 Time Out: 0915 Total Time: 75 Greater than 50%  of this time was spent counseling and coordinating care related to the above assessment and plan.  [place] Team Team Cell Phone: [phone] Please utilize secure chat with additional questions, if there is no response within 30 minutes please call the above phone number  Palliative Medicine Team providers are available by phone from 7am to 7pm daily and can be reached through the team cell phone.  Should this patient require assistance outside of these hours, please call the patient's attending physician.

## 2021-06-14 NOTE — Progress Notes (Addendum)
HD#[id number] Subjective:  Overnight Events: No acute events overnight  Denied any acute concerns. Stated in was 2022 when asked regarding the year and he was in [place].   Objective:  Vital signs in last 24 hours: Vitals:   06/13/21 0558 06/13/21 0800 06/13/21 1200 06/13/21 1943  BP:  116/76 115/75 116/86  Pulse:  (!) 51 (!) 56 90  Resp:  19 18 18   Temp: (!) 97.3 F (36.3 C) 97.7 F (36.5 C) 97.8 F (36.6 C) 98.6 F (37 C)  TempSrc: Oral Oral Oral Oral  SpO2:  100% 100% 99%  Weight:      Height:       Supplemental O2: Room Air SpO2: 99 %   Physical Exam:  Physical Exam General: NAD Head: Normocephalic without scalp lesions.  Mouth: Lips normal color, without lesions. Moist mucus membrane. Neck: Neck supple with full range of motion (ROM).   Lungs: CTAB, no wheeze, rhonchi or rales.  Cardiovascular: normal rate, irregular rhythm.  Abdomen: No TTP, normal bowel sounds MSK: No asymmetry or muscle atrophy.  Skin: warm, dry, good skin turgor, no lesions Neuro: Alert and oriented. CN grossly intact Psych: Normal mood and normal affect  Filed Weights   06/10/21 0535 06/11/21 0559 06/11/21 0700  Weight: 85.3 kg 87.3 kg 86.9 kg     Intake/Output Summary (Last 24 hours) at 06/14/2021 0737 Last data filed at 06/14/2021 0641 Gross per 24 hour  Intake --  Output 1050 ml  Net -1050 ml    Net IO Since Admission: -6,311.49 mL [06/14/21 0737]  Pertinent Labs: CBC Latest Ref Rng & Units 06/14/2021 06/13/2021 06/12/2021  WBC 4.0 - 10.5 K/uL 9.9 12.2(H) 13.8(H)  Hemoglobin 13.0 - 17.0 g/dL 8.5(L) 9.6(L) 10.5(L)  Hematocrit 39.0 - 52.0 % 26.0(L) 28.0(L) 32.2(L)  Platelets 150 - 400 K/uL 298 305 354    CMP Latest Ref Rng & Units 06/14/2021 06/13/2021 06/12/2021  Glucose 70 - 99 mg/dL 188(H) 126(H) 152(H)  BUN 8 - 23 mg/dL 26(H) 26(H) 28(H)  Creatinine 0.61 - 1.24 mg/dL 1.39(H) 1.36(H) 1.35(H)  Sodium 135 - 145 mmol/L 132(L) 135 134(L)  Potassium 3.5 - 5.1 mmol/L 3.7  3.3(L) 4.0  Chloride 98 - 111 mmol/L 105 105 104  CO2 22 - 32 mmol/L 23 22 21(L)  Calcium 8.9 - 10.3 mg/dL 8.5(L) 8.7(L) 8.8(L)  Total Protein 6.5 - 8.1 g/dL - - -  Total Bilirubin 0.3 - 1.2 mg/dL - - -  Alkaline Phos 38 - 126 U/L - - -  AST 15 - 41 U/L - - -  ALT 0 - 44 U/L - - -    Imaging: No results found.  Assessment/Plan:   Principal Problem:   Acute CVA (cerebrovascular accident) ([place]) Active Problems:   TIA (transient ischemic attack)   Malnutrition of moderate degree   Patient Summary: [name] [name] is a 75 y.o. with a pertinent PMH of A. 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 -SNF placement -Home medications include metoprolol, entresto, and spironolactone.  -High intensity statin, atorvastatin 80 mg daily -Continue working on risk management (HTN, T2DM)   Dementia Delirium due to hospitalization Patient has not required Ativan but patient still requiring Posey belt intermittently.  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  Patient with bradycardia overnight.  We will continue to monitor and hold metoprolol 25 mg.  Continue to hold spironolactone and Entresto. - Monitor volume status - Hold metoprolol 25 mg    Hyponatremia Sodium 132 from 135. Possibly hypovolemic hyponatremia.  -Continue to monitor. -BMP tomorrow am  Hypokalemia (resolved) K of 3. 7.  Mag 1.8 -Will replete  magnesium today.  -repeat BMP tomorrow  Leukocytosis (resolved) Leukocytosis improving to 9.9 from 12.2.  Do not suspect infectious etiology at this time -Daily CBCs -If fevers, begin infectious work-up  Diet:  dysphagia 3 IVF: None,None VTE: Eliquis Code: DNR PT/OT recs: SNF for Subacute PT,. TOC recs: Pending Placement Family Update: Updated yesterday, family at bedside.   Dispo: Anticipated discharge to Skilled nursing facility pending discontinuation of restraints and need for sedating medications.  [name], MD Tillie Rung. [facility] Internal Medicine Residency, PGY-1  Please contact the on call pager after 5 pm and on weekends at [phone].

## 2021-06-14 NOTE — Plan of Care (Signed)
Pt is alert oriented x 1, confused. Pt restless and becomes agitated. Sitter present at bedside. Pt has abdominal restraint in place. Pt continues to pull at catheter. Education provided multiple times on the need for the catheter and risks for pulling at this catheter. Pt has asked multiple times to get up to pee pt informed he has a catheter in place for urine collection. Pt took medications in magic cup. Pt refused to drink ensure.     Problem: Health Behavior/Discharge Planning: Goal: Ability to manage health-related needs will improve Outcome: Progressing   Problem: Clinical Measurements: Goal: Ability to maintain clinical measurements within normal limits will improve Outcome: Progressing Goal: Will remain free from infection Outcome: Progressing Goal: Diagnostic test results will improve Outcome: Progressing Goal: Respiratory complications will improve Outcome: Progressing Goal: Cardiovascular complication will be avoided Outcome: Progressing   Problem: Activity: Goal: Risk for activity intolerance will decrease Outcome: Progressing   Problem: Nutrition: Goal: Adequate nutrition will be maintained Outcome: Progressing   Problem: Coping: Goal: Level of anxiety will decrease Outcome: Progressing   Problem: Elimination: Goal: Will not experience complications related to bowel motility Outcome: Progressing Goal: Will not experience complications related to urinary retention Outcome: Progressing   Problem: Pain Managment: Goal: General experience of comfort will improve Outcome: Progressing   Problem: Safety: Goal: Ability to remain free from injury will improve Outcome: Progressing   Problem: Skin Integrity: Goal: Risk for impaired skin integrity will decrease Outcome: Progressing   Problem: Safety: Goal: Non-violent Restraint(s) Outcome: Progressing

## 2021-06-15 DIAGNOSIS — I639 Cerebral infarction, unspecified: Secondary | ICD-10-CM | POA: Diagnosis not present

## 2021-06-15 LAB — CBC WITH DIFFERENTIAL/PLATELET
Abs Immature Granulocytes: 0.04 10*3/uL (ref 0.00–0.07)
Basophils Absolute: 0 10*3/uL (ref 0.0–0.1)
Basophils Relative: 0 %
Eosinophils Absolute: 0.1 10*3/uL (ref 0.0–0.5)
Eosinophils Relative: 1 %
HCT: 26.4 % — ABNORMAL LOW (ref 39.0–52.0)
Hemoglobin: 8.6 g/dL — ABNORMAL LOW (ref 13.0–17.0)
Immature Granulocytes: 0 %
Lymphocytes Relative: 51 %
Lymphs Abs: 5.1 10*3/uL — ABNORMAL HIGH (ref 0.7–4.0)
MCH: 33.1 pg (ref 26.0–34.0)
MCHC: 32.6 g/dL (ref 30.0–36.0)
MCV: 101.5 fL — ABNORMAL HIGH (ref 80.0–100.0)
Monocytes Absolute: 0.2 10*3/uL (ref 0.1–1.0)
Monocytes Relative: 2 %
Neutro Abs: 4.7 10*3/uL (ref 1.7–7.7)
Neutrophils Relative %: 46 %
Platelets: 292 10*3/uL (ref 150–400)
RBC: 2.6 MIL/uL — ABNORMAL LOW (ref 4.22–5.81)
RDW: 14 % (ref 11.5–15.5)
WBC: 10.3 10*3/uL (ref 4.0–10.5)
nRBC: 0 % (ref 0.0–0.2)

## 2021-06-15 LAB — GLUCOSE, CAPILLARY
Glucose-Capillary: 117 mg/dL — ABNORMAL HIGH (ref 70–99)
Glucose-Capillary: 225 mg/dL — ABNORMAL HIGH (ref 70–99)
Glucose-Capillary: 166 mg/dL — ABNORMAL HIGH (ref 70–99)

## 2021-06-15 LAB — BASIC METABOLIC PANEL
Anion gap: 6 (ref 5–15)
BUN: 26 mg/dL — ABNORMAL HIGH (ref 8–23)
CO2: 23 mmol/L (ref 22–32)
Calcium: 8.6 mg/dL — ABNORMAL LOW (ref 8.9–10.3)
Chloride: 104 mmol/L (ref 98–111)
Creatinine, Ser: 1.46 mg/dL — ABNORMAL HIGH (ref 0.61–1.24)
GFR, Estimated: 50 mL/min — ABNORMAL LOW (ref >60)
Glucose, Bld: 140 mg/dL — ABNORMAL HIGH (ref 70–99)
Potassium: 4 mmol/L (ref 3.5–5.1)
Sodium: 133 mmol/L — ABNORMAL LOW (ref 135–145)

## 2021-06-15 MED ORDER — QUETIAPINE FUMARATE 25 MG PO TABS
12.5000 mg | ORAL_TABLET | Freq: Every day | ORAL | Status: DC
  Administered 2021-06-15 – 2021-06-29 (×15): 12.5 mg via ORAL
  Filled 2021-06-15 (×15): qty 1

## 2021-06-15 MED ORDER — RIVASTIGMINE TARTRATE 1.5 MG PO CAPS
4.5000 mg | ORAL_CAPSULE | Freq: Two times a day (BID) | ORAL | Status: DC
  Administered 2021-06-15 – 2021-06-18 (×6): 4.5 mg via ORAL
  Filled 2021-06-15 (×7): qty 3

## 2021-06-15 NOTE — Discharge Summary (Signed)
Name: [name] [name] MRN: [id number] DOB: 01-22-1946 75 y.o. PCP: [facility], [facility]  Date of Admission: 05/25/2021 12:03 PM Date of Discharge: 07/03/21 Attending Physician: [name] Contes, MD  Discharge Diagnosis: Cerebrovascular Accident   [name] [name] complicated by hospital delirium HFrEF Atrial fibrillation Hypertension Diabetes Mellitus II Hypokalemia; resolved AKI on CKD 2/2 to urinary retention; resolved Urinary Tract Infection 2/2 urinary retention; resolved Macrocytic Anemia Constipation; resolved Discharge Medications: Allergies as of 07/03/2021   No Known Allergies      Medication List     STOP taking these medications    metoprolol succinate 100 MG 24 hr tablet Commonly known as: TOPROL-XL   sacubitril-valsartan 24-26 MG Commonly known as: ENTRESTO   sildenafil 100 MG tablet Commonly known as: VIAGRA   spironolactone 25 MG tablet Commonly known as: ALDACTONE   vitamin B-12 500 MCG tablet Commonly known as: CYANOCOBALAMIN       TAKE these medications    (feeding supplement) PROSource Plus liquid Take 30 mLs by mouth 2 (two) times daily between meals.   feeding supplement Liqd Take 237 mLs by mouth 2 (two) times daily with a meal.   Ensure Max Protein Liqd Take 330 mLs (11 oz total) by mouth daily.   acetaminophen 500 MG tablet Commonly known as: TYLENOL Take 1,000 mg by mouth every 6 (six) hours as needed for mild pain.   apixaban 5 MG Tabs tablet Commonly known as: ELIQUIS Take 1 tablet (5 mg total) by mouth 2 (two) times daily. What changed: when to take this   atorvastatin 80 MG tablet Commonly known as: LIPITOR Take 1 tablet (80 mg total) by mouth daily. Start taking on: July 04, 2021   Brinzolamide-Brimonidine 1-0.2 % Susp Place 1 drop into both eyes in the morning, at noon, and at bedtime.   Cholecalciferol 50 MCG (2000 UT) Tabs Take 50 mcg by mouth daily.   finasteride 5 MG tablet Commonly known as:  PROSCAR Take 5 mg by mouth daily.   folic acid 1 MG tablet Commonly known as: FOLVITE Take 1 tablet (1 mg total) by mouth daily. Start taking on: July 04, 2021   Glycerin (Adult) 2 g Supp Place 1 suppository rectally daily as needed for moderate constipation or severe constipation.   latanoprost 0.005 % ophthalmic solution Commonly known as: XALATAN Place 1 drop into both eyes at bedtime.   metoprolol tartrate 25 MG tablet Commonly known as: LOPRESSOR Take 0.5 tablets (12.5 mg total) by mouth 2 (two) times daily.   mirtazapine 7.5 MG tablet Commonly known as: REMERON Take 1 tablet (7.5 mg total) by mouth at bedtime.   polyethylene glycol 17 g packet Commonly known as: MIRALAX / GLYCOLAX Take 17 g by mouth daily as needed for moderate constipation.   QUEtiapine 25 MG tablet Commonly known as: SEROQUEL Take 0.5 tablets (12.5 mg total) by mouth 2 (two) times daily.   rivastigmine 4.5 MG capsule Commonly known as: EXELON Take 1 capsule (4.5 mg total) by mouth 2 (two) times daily.   senna-docusate 8.6-50 MG tablet Commonly known as: Senokot-S Take 1 tablet by mouth at bedtime.   tamsulosin 0.4 MG Caps capsule Commonly known as: FLOMAX Take 2 capsules (0.8 mg total) by mouth daily. Start taking on: July 04, 2021 What changed: how much to take        Disposition and follow-up:   [name] [name] was discharged from [name] [name] in Stable condition.  At the hospital follow up visit please address:  HFrEF- assess fluid status.  [name] complicated by hospital delirium- assess for family's ability to care for him at home  Atrial fibrillation- assess HR and blood pressure on metoprolol  Hypertension- recheck in ambulatory setting  Diabetes Mellitus II- stopped patient's insulin in the hospital. Recheck glucose on BMP  Urinary Tract Infection;urinary retention-  assess for dysuria or suprapubic tenderness. Assess for ability to void  independently vs requiring some I/Os  7.  Labs / imaging needed at time of follow-up: CBC, BMP   8.  Pending labs/ test needing follow-up: None  Follow-up Appointments:   Hospital Course by problem list: HPI Per Dr. 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 [name] [name] has [name] with short-term memory deficits at baseline. His wife [name] [name] is at bedside to assist with history taking.    At 9:30 AM today, [name] [name] was noted to have sudden onset left leg weakness after attempting to stand up from the couch. He sat back down and his wife helped him stand up with a walker. [name] [name] noticed her husband was slightly dragging his left leg. After a few minutes of walking with the walker, [name] [name] became weak and slumped over, however he never lost consciousness. When he slumped over, he landed on his knees. No LOC or head trauma.   At this time, [name] [name] denies any acute complaints. His wife notes chronic fatigue that has been ongoing. At recent PCP visit, Amlodipine, Metformin and Lantus were discontinued.     [name] [name] endorses worsening [name] over the last months and years. [name] [name] has been experiencing visual hallucinations although this has seemed improved lately. He has been struggling with both short and long term memory. He is independent in ADLs such as grooming and bathing; he is still able to mow the lawn. However, [name] [name] manages his medications.   Acute lacunar infarct secondary to small vessel disease MRA head and neck with no evidence of large vessel occlusion.  Echo negative for thrombus. No deficits from the stroke were seen.  Neuro consulted and state this is likely secondary to small vessel disease. Neurology signed off. PT/OT recommending SNF. His SNF placement was complicated by his delirium causing him to be in restraints. Home  bp medications include metoprolol, entresto, and spironolactone. Entresto and spironolactone were held due to soft pressures and low PO intake. His high intensity statin, atorvastatin 80 mg daily was continued.    Acute delirium due to hospitalization with underlying [name] On presentation, patient's wife endorsed patient becoming more forgetful and confused. She stated he gets visual hallucinations at times and states things that did not happen. She specifically states he keeps referring to her as a high school sweet heart but they met as adults. His mentation worsened as his hospital stay progressed. This was possibly complicated by constipation and urinary obstruction. His mentation cleared with foley catheter placement and then later with I/O catheters. Seroquel 12.5 BID, Rivastigmine 4.5 mg, and Remeron 7.5 mg was started and patient's mentation improved.   Urinary Tract Infection Urinary Obstruction AKI on CKD Stage IIIa Leukocytosis As patient's hospital stay progressed, his urine output declined. Bladder scan showed he was retaining. When foley was placed it showed hematuria. Urology consulted who irrigated the bladder and placed a foley and started Cipro. He completed 4 days of Cipro and then  it was discontinued due to mixed flora shown by urine culture.  Improvement in mentation following relief of urinary obstruction and treatment for UTI. Patient continued to retain urine but with titration of flomax and finasteride patient was able to receive one I/O per shift and sometimes void on his own. Patient was discahrged on flomax 0.8 mg and finasteride 5 daily.   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  LV was mildly dilated.  Patient did have some evidence of mitral regurgitation, however only moderate.  Recent left heart cath done at the [facility] demonstrated nonobstructive CAD.  Etiology of  cardiomyopathy undetermined at this time.  Current guideline directed medical therapy includes metoprolol, Entresto, and spironolactone.  Metoprolol 25 mg BID was restarted  Constipation During the hospital course, the patient was constipated and did not have a recorded bowel movement in the chart. He was on both Senna 1 tablet nightly and on Miralax 17 g daily since 06/02/21. He did not produce a bowel movement but has been passing gas. Bowel sounds are present. DRE was performed that showed soft stool in the rectum with no impaction noted. He produced a bowel movement with dulcolax suppository and increasing his Miralax and Senna. These were later decreased after patient became more regular in bowel movements.   Macrocytic anemia Patient has hgb that down-trended to anemic level. MCV was elevated and folic acid resulted low at 5.5. VB12 in normal range. Folic acid supplementation was started while inpatient.    Hypokalemia Patient had hypokalemia that was repleted as needed to get a K of 4.0.  This resolved by time of discharge.   Discharge Exam:   BP 100/72 (BP Location: Right Arm)   Pulse 98   Temp 98.4 F (36.9 C) (Oral)   Resp 14   Ht 6' 2"  (1.88 m)   Wt 88.2 kg   SpO2 100%   BMI 24.97 kg/m  Physical Exam:  Constitutional: elderly male laying in bed in NAD.  CV: RRR no m/r/g Pulmonary/Chest: normal WOB on room air, speaking in full sentences Abdominal: Soft.  NT/ND. Normal bowel sounds Neurological: Alert, at baseline. No focal deficits. Answering questions appropriately Skin: warm and dry, normal skin turgor Psych: Normal mood and affect   Pertinent Labs, Studies, and Procedures:  CT HEAD WO CONTRAST (5MM)  Result Date: 06/01/2021 CLINICAL DATA:  Neuro deficit, acute, stroke suspected.  Dysarthria. EXAM: CT HEAD WITHOUT CONTRAST TECHNIQUE: Contiguous axial images were obtained from the base of the skull through the vertex without intravenous contrast. COMPARISON:  CT and  MRI studies 05/25/2021 FINDINGS: Brain: Age related atrophy. Moderate chronic small-vessel ischemic changes of the hemispheric white matter. No identifiable acute or subacute infarction, mass lesion, hemorrhage, hydrocephalus or extra-axial collection. Punctate acute infarction seen within the white matter tracts on the right at previous MRI are not specifically discernible by CT. Sellar and suprasellar mass as seen previously. Vascular: There is atherosclerotic calcification of the major vessels at the base of the brain. Skull: Negative Sinuses/Orbits: Clear/normal Other: None IMPRESSION: No acute finding by CT. Atrophy and chronic small-vessel ischemic changes. Small acute right hemispheric white matter infarctions shown by MRI 1 week ago are not discernible by CT. Sellar and suprasellar mass as chronically seen. Electronically Signed   By: Nelson Chimes M.D.   On: 06/01/2021 17:22   [name] ANGIO HEAD WO CONTRAST  Result Date: 05/25/2021 CLINICAL DATA:  Neuro deficit, stroke  suspected EXAM: MRA NECK WITHOUT CONTRAST MRA HEAD WITHOUT CONTRAST TECHNIQUE: Angiographic images of the neck and circle of Willis were acquired using MRA technique without intravenous contrast. COMPARISON:  Correlation is made with CT head 05/25/2021 and MRI head 05/25/2021 FINDINGS: Evaluation is somewhat limited by motion. MRA NECK FINDINGS Standard aortic branching.  No evidence dissection or aneurysm. The bilateral carotid arteries are patent, without hemodynamically significant stenosis, occlusion, or aneurysm. The origins of the vertebral arteries are not well visualized, likely secondary to artifact. The remainder of the vertebral arteries are patent. MRA HEAD FINDINGS Both internal carotid arteries are patent to the termini, without stenosis or other abnormality. A1 segments patent. Anterior cerebral arteries are patent to their distal aspects. No M1 stenosis or occlusion. Normal MCA bifurcations. Distal MCA branches appear perfused.  Right dominant vertebral system. Vertebral arteries widely patent to the vertebrobasilar junction without stenosis. Basilar patent to its distal aspect. Superior cerebral arteries patent bilaterally. PCAs well perfused proximally, with poor visualization distally. IMPRESSION: Evaluation is somewhat limited by motion artifact. Within this limitation, no intracranial large vessel occlusion or hemodynamically significant stenosis in the neck. Poor opacification of the distal MCA and PCA branches, which may be secondary to technique. Electronically Signed   By: Merilyn Baba M.D.   On: 05/25/2021 18:27   [name] ANGIO NECK WO CONTRAST  Result Date: 05/25/2021 CLINICAL DATA:  Neuro deficit, stroke suspected EXAM: MRA NECK WITHOUT CONTRAST MRA HEAD WITHOUT CONTRAST TECHNIQUE: Angiographic images of the neck and circle of Willis were acquired using MRA technique without intravenous contrast. COMPARISON:  Correlation is made with CT head 05/25/2021 and MRI head 05/25/2021 FINDINGS: Evaluation is somewhat limited by motion. MRA NECK FINDINGS Standard aortic branching.  No evidence dissection or aneurysm. The bilateral carotid arteries are patent, without hemodynamically significant stenosis, occlusion, or aneurysm. The origins of the vertebral arteries are not well visualized, likely secondary to artifact. The remainder of the vertebral arteries are patent. MRA HEAD FINDINGS Both internal carotid arteries are patent to the termini, without stenosis or other abnormality. A1 segments patent. Anterior cerebral arteries are patent to their distal aspects. No M1 stenosis or occlusion. Normal MCA bifurcations. Distal MCA branches appear perfused. Right dominant vertebral system. Vertebral arteries widely patent to the vertebrobasilar junction without stenosis. Basilar patent to its distal aspect. Superior cerebral arteries patent bilaterally. PCAs well perfused proximally, with poor visualization distally. IMPRESSION: Evaluation  is somewhat limited by motion artifact. Within this limitation, no intracranial large vessel occlusion or hemodynamically significant stenosis in the neck. Poor opacification of the distal MCA and PCA branches, which may be secondary to technique. Electronically Signed   By: Merilyn Baba M.D.   On: 05/25/2021 18:27   [name] BRAIN WO CONTRAST  Result Date: 05/25/2021 CLINICAL DATA:  Neuro deficit, stroke suspected, left-sided weakness EXAM: MRI HEAD WITHOUT CONTRAST TECHNIQUE: Multiplanar, multiecho pulse sequences of the brain and surrounding structures were obtained without intravenous contrast. COMPARISON:  01/27/2021. FINDINGS: Brain: Restricted diffusion in the right periventricular white matter/corona radiata (series 5, images 83-85, as well as in the right posterior limb of the internal capsule. Redemonstrated sellar/suprasellar mass, measuring 1.8 x 1.9 x 2.5 cm (AP x TR x CC), previously 1.8 x 2.0 x 2.5 cm when remeasured similarly, better evaluated on the prior exam. Grossly unchanged mass effect on prechiasmatic optic nerves. Redemonstrated 3 mm bilateral frontal convexity subdural collection. No acute hemorrhage, hydrocephalus, or midline shift. T2 hyperintense signal in the periventricular white matter, likely the sequela of chronic  small vessel ischemic disease. Vascular: Normal flow voids. Skull and upper cervical spine: Normal marrow signal. Sinuses/Orbits: Left maxillary mucous retention cyst and mucosal thickening. Left posterior ethmoid air cell mucous retention cyst. The orbits are unremarkable. Other: The mastoids are well aerated. IMPRESSION: 1. Small areas of restricted diffusion in the right corona radiata and posterior limb of the right internal capsule, consistent with acute infarcts. 2. Redemonstrated pituitary mass, better evaluated on 01/27/2021, but overall unchanged compared to the prior exam 3. 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 These results were called by telephone at the time of interpretation on 05/25/2021 at 3:54 pm to provider DAN FLOYD , who verbally acknowledged these results. Electronically Signed   By: Merilyn Baba M.D.   On: 05/25/2021 15:55   US PELVIS LIMITED (TRANSABDOMINAL ONLY)  Result Date: 06/25/2021 CLINICAL DATA:  Urinary retention. EXAM: LIMITED ULTRASOUND OF PELVIS TECHNIQUE: Limited transabdominal ultrasound examination of the bladder and prostate gland was performed. COMPARISON:  06/01/2021. FINDINGS: Minimal debris is noted in the dependent portion of the urinary bladder. Prevoid bladder volume is 363.68 mL. Postvoid bladder volume could not be obtained. The prostate gland measures 6.2 x 4.6 x 5.4 cm, total volume 80.71 mL. IMPRESSION: 1. Minimal residual debris in the dependent portion of the urinary bladder. 2. Enlarged prostate gland. Electronically Signed   By: Brett Fairy M.D.   On: 06/25/2021 20:52   US RENAL  Result Date: 06/01/2021 CLINICAL DATA:  Acute kidney injury EXAM: RENAL / URINARY TRACT ULTRASOUND COMPLETE COMPARISON:  None. FINDINGS: Right Kidney: Renal measurements: 10.0 x 5.7 x 5.4 cm = volume: 161 mL. Mild right hydronephrosis. Normal echotexture. No mass. Left Kidney: Renal measurements: 10.4 x 5.2 x 5.0 cm = volume: 141 mL. Mild left hydronephrosis. Normal echotexture. Probable upper pole cyst measuring 3.6 cm. Bladder: Layering debris posteriorly. Other: None. IMPRESSION: Mild bilateral hydronephrosis. Layering debris within the bladder. Electronically Signed   By: Rolm Baptise M.D.   On: 06/01/2021 20:42   ECHOCARDIOGRAM COMPLETE BUBBLE STUDY  Result Date: 05/26/2021    ECHOCARDIOGRAM REPORT   Patient Name:   IBRAHEM VOLKMAN Date of Exam: 05/26/2021 Medical Rec #:  [id number]     Height:       75.0 in Accession #:    [id number]    Weight:       226.6 lb Date of Birth:  10-05-1945     BSA:          2.314 m Patient Age:    31 years      BP:           140/98 mmHg Patient  Gender: M             HR:           71 bpm. Exam Location:  Inpatient Procedure: 2D Echo, Color Doppler, Cardiac Doppler, Saline Contrast Bubble Study            and 3D Echo Indications:    Stroke 434.91 / I63.9  History:        Patient has no prior history of Echocardiogram examinations.                 Signs/Symptoms:[name]; Risk Factors:Diabetes and Hypertension.                 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  Sonographer:    Darlina Sicilian RDCS Referring Phys: [place]  Comments: 1 mg of Ativan given for echocardiogram. IMPRESSIONS  1. Left ventricular ejection fraction, by estimation, is 20 to 25%. The left ventricle has severely decreased function. The left ventricle demonstrates global hypokinesis. The left ventricular internal cavity size was mildly dilated. Left ventricular diastolic parameters are indeterminate. Elevated left atrial pressure.  2. Right ventricular systolic function is mildly reduced. The right ventricular size is normal.  3. Left atrial size was moderately dilated.  4. The mitral valve is grossly normal. At least moderate, eccentric mitral valve regurgitation that is functional in nature; this may be underestimated in this study. No evidence of mitral stenosis.  5. The aortic valve is tricuspid. Aortic valve regurgitation is mild.  6. The inferior vena cava is dilated in size with <50% respiratory variability, suggesting right atrial pressure of 15 mmHg.  7. Agitated saline contrast bubble study was negative, with no evidence of any interatrial shunt. Comparison(s): No prior Echocardiogram. Conclusion(s)/Recommendation(s): Per chart review; consistent with prior history noted of dilated cardioymyopathy with EF 25%. FINDINGS  Left Ventricle: Left ventricular ejection fraction, by estimation, is 20 to 25%. The left ventricle has severely decreased function. The left ventricle demonstrates global hypokinesis. 3D left ventricular  ejection fraction analysis performed but not reported based on interpreter judgement due to suboptimal quality. The left ventricular internal cavity size was mildly dilated. There is no left ventricular hypertrophy. Left ventricular diastolic parameters are indeterminate. Elevated left atrial pressure. Right Ventricle: The right ventricular size is normal. No increase in right ventricular wall thickness. Right ventricular systolic function is mildly reduced. Left Atrium: Left atrial size was moderately dilated. Right Atrium: Right atrial size was normal in size. Pericardium: There is no evidence of pericardial effusion. Mitral Valve: The mitral valve is grossly normal. Moderate mitral valve regurgitation. No evidence of mitral valve stenosis. Tricuspid Valve: The tricuspid valve is normal in structure. Tricuspid valve regurgitation is mild . No evidence of tricuspid stenosis. Aortic Valve: The aortic valve is tricuspid. Aortic valve regurgitation is mild. Pulmonic Valve: The pulmonic valve was normal in structure. Pulmonic valve regurgitation is not visualized. Aorta: The aortic root is normal in size and structure. Venous: The inferior vena cava is dilated in size with less than 50% respiratory variability, suggesting right atrial pressure of 15 mmHg. IAS/Shunts: The atrial septum is grossly normal. Agitated saline contrast was given intravenously to evaluate for intracardiac shunting. Agitated saline contrast bubble study was negative, with no evidence of any interatrial shunt.  LEFT VENTRICLE PLAX 2D LVIDd:         6.10 cm Diastology LVIDs:         5.40 cm LV e' medial:    4.24 cm/s LV PW:         1.00 cm LV E/e' medial:  20.2 LV IVS:        1.00 cm LV e' lateral:   5.08 cm/s                        LV E/e' lateral: 16.8                         3D Volume EF:                        3D EF:        30 %  LV EDV:       190 ml                        LV ESV:       132 ml                        LV  SV:        57 ml RIGHT VENTRICLE RV S prime:     9.70 cm/s TAPSE (M-mode): 1.1 cm LEFT ATRIUM              Index        RIGHT ATRIUM           Index LA diam:        4.60 cm  1.99 cm/m   RA Area:     14.70 cm LA Vol (A2C):   107.0 ml 46.24 ml/m  RA Volume:   32.60 ml  14.09 ml/m LA Vol (A4C):   86.9 ml  37.55 ml/m LA Biplane Vol: 104.0 ml 44.94 ml/m  AORTIC VALVE LVOT Vmax:   73.10 cm/s LVOT Vmean:  46.100 cm/s LVOT VTI:    0.110 m MITRAL VALVE                  TRICUSPID VALVE MV Area (PHT): 4.67 cm       TR Peak grad:   35.3 mmHg MV Decel Time: 162 msec       TR Vmax:        297.00 cm/s [name] Peak grad:    94.9 mmHg [name] Mean grad:    64.3 mmHg    SHUNTS [name] Vmax:         487.00 cm/s  Systemic VTI: 0.11 m [name] Vmean:        377.3 cm/s [name] PISA:         1.57 cm [name] PISA Eff ROA: 13 mm [name] PISA Radius:  0.50 cm MV E velocity: 85.43 cm/s Rudean Haskell MD Electronically signed by Rudean Haskell MD Signature Date/Time: 05/26/2021/11:43:27 AM    Final    CT HEAD CODE STROKE WO CONTRAST  Result Date: 05/25/2021 CLINICAL DATA:  Code stroke.  Neuro deficit, acute, stroke suspected EXAM: CT HEAD WITHOUT CONTRAST TECHNIQUE: Contiguous axial images were obtained from the base of the skull through the vertex without intravenous contrast. COMPARISON:  MRI January 27, 2021. CT head February 29, 2020. FINDINGS: Brain: No evidence of acute large vascular territory infarction, hemorrhage, hydrocephalus, extra-axial collection or intraparenchymal brain mass lesion/mass effect. Redemonstrated pituitary mass with suprasellar extension, better characterized on prior MRI. Chronic lacunar infarcts in bilateral basal ganglia and corona radiata. Probable thin chronic subdural fluid collections, better characterized on prior MRI. Vascular: No hyperdense vessel identified. Skull: No acute fracture. Sinuses/Orbits: Visible sinuses are clear. No acute findings in the visualized orbits. Other: No mastoid effusions. ASPECTS [facility]  Stroke Program Early CT Score) total score (0-10 with 10 being normal): 10. IMPRESSION: 1. No evidence of acute large vascular territory infarct or acute hemorrhage. ASPECTS is 10. 2. Chronic lacunar infarcts and chronic microvascular ischemic disease. 3. Probable thin chronic subdural fluid collections, better characterized on prior MRI. 4. Redemonstrated pituitary mass with suprasellar extension, better characterized on prior MRI. Code stroke imaging results were communicated on 05/25/2021 at 12:24 pm to provider Dr. Theda Sers via secure text paging. Electronically Signed   By: Margaretha Sheffield M.D.   On: 05/25/2021 12:28  CBC Latest Ref Rng & Units 06/30/2021 06/28/2021 06/17/2021  WBC 4.0 - 10.5 K/uL 5.7 4.9 8.8  Hemoglobin 13.0 - 17.0 g/dL 10.5(L) 9.6(L) 9.1(L)  Hematocrit 39.0 - 52.0 % 31.7(L) 28.8(L) 27.1(L)  Platelets 150 - 400 K/uL 160 134(L) 264   Lab Results  Component Value Date   NA 134 (L) 06/30/2021   K 3.9 06/30/2021   CO2 22 06/30/2021   GLUCOSE 218 (H) 06/30/2021   BUN 25 (H) 06/30/2021   CREATININE 1.58 (H) 06/30/2021   CALCIUM 9.3 06/30/2021   GFRNONAA 45 (L) 06/30/2021     Discharge Instructions:   Signed: Scarlett Presto, MD [place]. [facility] Internal Medicine Residency, PGY-1  06/15/2021, 2:47 PM   Pager: [phone]

## 2021-06-15 NOTE — Plan of Care (Signed)
  Problem: Health Behavior/Discharge Planning: Goal: Ability to manage health-related needs will improve Outcome: Progressing   Problem: Clinical Measurements: Goal: Diagnostic test results will improve Outcome: Progressing   Problem: Nutrition: Goal: Adequate nutrition will be maintained Outcome: Progressing   Problem: Coping: Goal: Level of anxiety will decrease Outcome: Progressing   Problem: Elimination: Goal: Will not experience complications related to bowel motility Outcome: Progressing Goal: Will not experience complications related to urinary retention Outcome: Progressing   Problem: Safety: Goal: Ability to remain free from injury will improve Outcome: Progressing   Problem: Skin Integrity: Goal: Risk for impaired skin integrity will decrease Outcome: Progressing

## 2021-06-15 NOTE — Progress Notes (Signed)
HD#19 Subjective:  Overnight Events: No acute events overnight  Patient sitting on recliner this morning. He was working on a crossword possible at time of evaluation. He stated his knee was hurting. Other than that he denied any concerns. When asked he stated he was in the hospital and it was 2018. He stated he came to the hospital to get a job.   Objective:  Vital signs in last 24 hours: Vitals:   06/14/21 1017 06/14/21 1602 06/14/21 1926 06/14/21 2050  BP: 112/81 128/64 105/70 105/81  Pulse: 77 84 64 100  Resp: 19 18 18 19   Temp: 98.2 F (36.8 C) 98.6 F (37 C) 98.9 F (37.2 C) 99.6 F (37.6 C)  TempSrc: Oral Oral Oral Oral  SpO2: 100% 100% 100% 100%  Weight:      Height:       Supplemental O2: Room Air SpO2: 100 %   Physical Exam:  Physical Exam General: NAD Head: Normocephalic without scalp lesions.  Neck: Neck supple with full range of motion (ROM). No masses or tenderness.  Lungs: CTAB, no wheeze, rhonchi or rales.  Cardiovascular: Normal rate, irregular rhythm. MSK: No asymmetry or muscle atrophy. Full range of motion (ROM) of all joints. No injuries noted. Knees without swelling, or warmth Skin: no lesions Neuro: Alert and but not oriented appears at baseline. CN grossly intact Psych: Normal mood and normal affect   Filed Weights   06/10/21 0535 06/11/21 0559 06/11/21 0700  Weight: 85.3 kg 87.3 kg 86.9 kg     Intake/Output Summary (Last 24 hours) at 06/15/2021 0530 Last data filed at 06/14/2021 1821 Gross per 24 hour  Intake 1721.72 ml  Output 1260 ml  Net 461.72 ml    Net IO Since Admission: -5,319.77 mL [06/15/21 0530]  Pertinent Labs: CBC Latest Ref Rng & Units 06/15/2021 06/14/2021 06/13/2021  WBC 4.0 - 10.5 K/uL 10.3 9.9 12.2(H)  Hemoglobin 13.0 - 17.0 g/dL 8.6(L) 8.5(L) 9.6(L)  Hematocrit 39.0 - 52.0 % 26.4(L) 26.0(L) 28.0(L)  Platelets 150 - 400 K/uL 292 298 305    CMP Latest Ref Rng & Units 06/15/2021 06/14/2021 06/13/2021   Glucose 70 - 99 mg/dL 140(H) 188(H) 126(H)  BUN 8 - 23 mg/dL 26(H) 26(H) 26(H)  Creatinine 0.61 - 1.24 mg/dL 1.46(H) 1.39(H) 1.36(H)  Sodium 135 - 145 mmol/L 133(L) 132(L) 135  Potassium 3.5 - 5.1 mmol/L 4.0 3.7 3.3(L)  Chloride 98 - 111 mmol/L 104 105 105  CO2 22 - 32 mmol/L 23 23 22   Calcium 8.9 - 10.3 mg/dL 8.6(L) 8.5(L) 8.7(L)  Total Protein 6.5 - 8.1 g/dL - - -  Total Bilirubin 0.3 - 1.2 mg/dL - - -  Alkaline Phos 38 - 126 U/L - - -  AST 15 - 41 U/L - - -  ALT 0 - 44 U/L - - -    Imaging: No results found.  Assessment/Plan:   Principal Problem:   Acute CVA (cerebrovascular accident) ([place]) Active Problems:   TIA (transient ischemic attack)   Malnutrition of moderate degree   Patient Summary: [name] [name] is a 75 y.o. with a pertinent PMH of A. 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   Dementia (suspected Lewy Body dementia) Delirium due to hospitalization Patient appeared most at baseline today. He continues to show consistency in his mentation. He was without restraints with a sitter  at bedside. This could be due to initiation of Seroquel 12.5 mg in the morning. He appeared calm and was working on crossword possible something he enjoys doing. 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 -SNF placement -Home medications include metoprolol, entresto, and spironolactone. These are currently held due to normotensive pressures. Metoprolol held for concern for bradycardia with rivastigmine.  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  Patient became brady with metoprolol and increased Rivastigmine dosage. We will hold metoprolol with increase in rivastigmine.  Continue to hold spironolactone and Entresto. - Monitor volume status - Hold metoprolol 25 mg, Entresto, Spironolactone.     Hyponatremia (improving) Sodium 133 from 132 yesterday. Possibly hypovolemic hyponatremia.  -Continue to monitor. -BMP tomorrow am  Hypokalemia (resolved) K of 4.0.  Mag repleted 06/14/21 -Daily BMP tomorrow  Leukocytosis (resolved) Leukocytosis improving to 9.9 from 12.2 06/15/21.  Do not suspect infectious etiology at this time -Daily CBCs -If fevers, begin infectious work-up  Diet:  dysphagia 3 IVF: None,None VTE: Eliquis Code: DNR PT/OT recs: SNF for Subacute PT,. TOC recs: Pending Placement Family Update: Updated yesterday, family at bedside.   Dispo: Anticipated discharge to Skilled nursing facility pending discontinuation of restraints and need for sedating medications.  [name], MD [place]. [facility] Internal Medicine Residency, PGY-1  Please contact the on call pager after 5 pm and on weekends at [phone].

## 2021-06-15 NOTE — Progress Notes (Signed)
Physical Therapy Treatment Patient Details Name: [name] [name] MRN: [id number] DOB: 07-29-1946 Today's Date: 06/15/2021   History of Present Illness [name] is a 75 y/o male admitted 05/25/21 with c/o left leg weakness. Mr. Heindl has dementia with short-term memory deficits at baseline. 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 PMH: A. 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    PT Comments    Pt remains pleasantly confused but demo'd improved command following and ability to tolerate session without as much tangential conversation. Pt's conversation more relevant to topics at hand. Pt with improved ambulation tolerance but continues to demo poor walker safety and requires assist for safe mobility. Acute PT to cont to follow.    Recommendations for follow up therapy are one component of a multi-disciplinary discharge planning process, led by the attending physician.  Recommendations may be updated based on patient status, additional functional criteria and insurance authorization.  Follow Up Recommendations  Skilled nursing-short term rehab (<3 hours/day)     Assistance Recommended at Discharge Frequent or constant Supervision/Assistance  Equipment Recommendations   (TBD at next facility)    Recommendations for Other Services       Precautions / Restrictions Precautions Precautions: Fall Precaution Comments: confused, sitter Restrictions Weight Bearing Restrictions: No     Mobility  Bed Mobility               General bed mobility comments: In recliner    Transfers Overall transfer level: Needs assistance Equipment used: Rolling walker (2 wheels) Transfers: Sit to/from Stand Sit to Stand: Min guard;Min assist           General transfer comment: Min A from lower surface, min guard fro safety from chair    Ambulation/Gait Ambulation/Gait assistance: Min assist;+2  safety/equipment Gait Distance (Feet): 100 Feet (x1, 40x1) Assistive device: Rolling walker (2 wheels) Gait Pattern/deviations: Step-through pattern;Decreased stride length;Trunk flexed Gait velocity: dec Gait velocity interpretation: <1.31 ft/sec, indicative of household ambulator General Gait Details: pt with freq standing stops due to fatigue, SOB noted, HR 80s, SpO2 100% on RA, pt wiht 1 seated rest break at 100'   Stairs             Wheelchair Mobility    Modified Rankin (Stroke Patients Only) Modified Rankin (Stroke Patients Only) Pre-Morbid Rankin Score: No significant disability Modified Rankin: Moderately severe disability     Balance Overall balance assessment: Needs assistance Sitting-balance support: Feet supported;No upper extremity supported Sitting balance-Leahy Scale: Good Sitting balance - Comments: close supervision EOB while attempting to donn socks.   Standing balance support: Bilateral upper extremity supported;During functional activity Standing balance-Leahy Scale: Poor Standing balance comment: dependent on RW                            Cognition Arousal/Alertness: Awake/alert Behavior During Therapy: WFL for tasks assessed/performed Overall Cognitive Status: No family/caregiver present to determine baseline cognitive functioning Area of Impairment: Orientation;Memory;Attention;Following commands;Safety/judgement;Awareness;Problem solving                 Orientation Level: Disoriented to;Place;Time;Situation Current Attention Level: Focused (easily distracted, tangential) Memory: Decreased recall of precautions;Decreased short-term memory Following Commands: Follows one step commands with increased time;Follows one step commands inconsistently Safety/Judgement: Decreased awareness of safety;Decreased awareness of deficits Awareness: Emergent Problem Solving: Slow processing;Decreased initiation;Requires verbal cues;Requires  tactile cues General Comments: pt cooperative and pleasant this session,  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 [employer] Comments General comments (skin integrity, edema, etc.): VSS      Pertinent Vitals/Pain Pain Assessment: No/denies pain Pain Location: pt would rub R knee occasionally during ambulation stating "it's achy" Pain Descriptors / Indicators: Aching Pain Intervention(s): Monitored during session    Home Living                          Prior Function            PT Goals (current goals can now be found in the care plan section) Acute Rehab PT Goals PT Goal Formulation: Patient unable to participate in goal setting Time For Goal Achievement: 06/23/21 Potential to Achieve Goals: Fair Progress towards PT goals: Progressing toward goals    Frequency    Min 3X/week      PT Plan Current plan remains appropriate    Co-evaluation              AM-PAC PT "6 Clicks" Mobility   Outcome Measure  Help needed turning from your back to your side while in a flat bed without using bedrails?: A Little Help needed moving from lying on your back to sitting on the side of a flat bed without using bedrails?: A Little Help needed moving to and from a bed to a chair (including a wheelchair)?: A Lot Help needed standing up from a chair using your arms (e.g., wheelchair or bedside chair)?: A Lot Help needed to walk in hospital room?: A Lot Help needed climbing 3-5 steps with a railing? : Total 6 Click Score: 13    End of Session Equipment Utilized During Treatment: Gait belt Activity Tolerance: Patient tolerated treatment well Patient left: in bed;with call bell/phone within reach;with nursing/sitter in room (posey belt re-applied) Nurse Communication: Mobility status PT Visit Diagnosis: Other abnormalities of gait and mobility (R26.89);Hemiplegia and hemiparesis;Muscle weakness (generalized) (M62.81) Hemiplegia -  Right/Left: Left Hemiplegia - dominant/non-dominant: Non-dominant Hemiplegia - caused by: Cerebral infarction     Time: 0842-0900 PT Time Calculation (min) (ACUTE ONLY): 18 min  Charges:  $Gait Training: 8-22 mins                     Kittie Plater, PT, DPT Acute Rehabilitation Services Pager #: [phone] Office #: [phone]    [place] Lopes 06/15/2021, 9:57 AM

## 2021-06-15 NOTE — TOC Progression Note (Signed)
Transition of Care [facility]) - Progression Note    Patient Details  Name: [name] [name] MRN: [id number] Date of Birth: 12-Jun-1946  Transition of Care [facility]) CM/SW [place], [place] Phone Number: 06/15/2021, 3:25 PM  Clinical Narrative:   CSW continuing to follow for disposition. CSW looking for memory care placement for patient, as sitter is unable to be discontinued due to cognition. Milus Glazier is reviewing, but concerned about patient agitation and aggression on 10/29. Want to give him a few more days to make sure he won't be aggressive anymore, then they will review again. No other memory care offers at this time.    Expected Discharge Plan: [place] Barriers to Discharge: Continued Medical Work up, Requiring sitter/restraints  Expected Discharge Plan and Services Expected Discharge Plan: [place] Choice: [place] arrangements for the past 2 months: Single Family Home                                       Social Determinants of Health (SDOH) Interventions    Readmission Risk Interventions No flowsheet data found.

## 2021-06-16 LAB — CBC WITH DIFFERENTIAL/PLATELET
Abs Immature Granulocytes: 0.03 10*3/uL (ref 0.00–0.07)
Basophils Absolute: 0.1 10*3/uL (ref 0.0–0.1)
Basophils Relative: 1 %
Eosinophils Absolute: 0.1 10*3/uL (ref 0.0–0.5)
Eosinophils Relative: 1 %
HCT: 27.7 % — ABNORMAL LOW (ref 39.0–52.0)
Hemoglobin: 9.1 g/dL — ABNORMAL LOW (ref 13.0–17.0)
Immature Granulocytes: 0 %
Lymphocytes Relative: 49 %
Lymphs Abs: 4.6 10*3/uL — ABNORMAL HIGH (ref 0.7–4.0)
MCH: 33.7 pg (ref 26.0–34.0)
MCHC: 32.9 g/dL (ref 30.0–36.0)
MCV: 102.6 fL — ABNORMAL HIGH (ref 80.0–100.0)
Monocytes Absolute: 0.2 10*3/uL (ref 0.1–1.0)
Monocytes Relative: 2 %
Neutro Abs: 4.4 10*3/uL (ref 1.7–7.7)
Neutrophils Relative %: 47 %
Platelets: 288 10*3/uL (ref 150–400)
RBC: 2.7 MIL/uL — ABNORMAL LOW (ref 4.22–5.81)
RDW: 14.1 % (ref 11.5–15.5)
WBC: 9.4 10*3/uL (ref 4.0–10.5)
nRBC: 0 % (ref 0.0–0.2)

## 2021-06-16 LAB — BASIC METABOLIC PANEL
Anion gap: 5 (ref 5–15)
BUN: 27 mg/dL — ABNORMAL HIGH (ref 8–23)
CO2: 23 mmol/L (ref 22–32)
Calcium: 8.5 mg/dL — ABNORMAL LOW (ref 8.9–10.3)
Chloride: 105 mmol/L (ref 98–111)
Creatinine, Ser: 1.51 mg/dL — ABNORMAL HIGH (ref 0.61–1.24)
GFR, Estimated: 48 mL/min — ABNORMAL LOW (ref >60)
Glucose, Bld: 142 mg/dL — ABNORMAL HIGH (ref 70–99)
Potassium: 4.3 mmol/L (ref 3.5–5.1)
Sodium: 133 mmol/L — ABNORMAL LOW (ref 135–145)

## 2021-06-16 LAB — GLUCOSE, CAPILLARY
Glucose-Capillary: 125 mg/dL — ABNORMAL HIGH (ref 70–99)
Glucose-Capillary: 152 mg/dL — ABNORMAL HIGH (ref 70–99)
Glucose-Capillary: 219 mg/dL — ABNORMAL HIGH (ref 70–99)
Glucose-Capillary: 227 mg/dL — ABNORMAL HIGH (ref 70–99)
Glucose-Capillary: 86 mg/dL (ref 70–99)

## 2021-06-16 NOTE — Progress Notes (Signed)
HD#20 Subjective:  Overnight Events: NAEO  Patient was talking to his wife on the phone, he says that she is doing the same as she always has been. He says that he has seen better days himself but also worse days.  Objective:  Vital signs in last 24 hours: Vitals:   06/16/21 0829 06/16/21 0930 06/16/21 1245 06/16/21 1909  BP: 108/69  113/70 118/65  Pulse: (!) 53 69 72 81  Resp: 18  20 19   Temp: 98.4 F (36.9 C)  97.6 F (36.4 C) 98.4 F (36.9 C)  TempSrc: Oral  Oral Oral  SpO2: 100% 100% 100% 100%  Weight:      Height:       Supplemental O2: Room Air SpO2: 100 %   Physical Exam:  Constitutional: elderly man resting in bed in no acute distress HENT: normocephalic atraumatic, mucous membranes moist Eyes: conjunctiva non-erythematous Neck: supple Cardiovascular: regular rate and rhythm, no m/r/g Pulmonary/Chest: normal work of breathing on room air, lungs clear to auscultation bilaterally Abdominal: soft, non-tender, non-distended MSK: normal bulk and tone Neurological: alert answering questions appropriately Skin: warm and dry, normal skin turgor Psych: pleasant affect  Filed Weights   06/11/21 0700 06/15/21 0500 06/16/21 0700  Weight: 86.9 kg 88.2 kg 91.9 kg     Intake/Output Summary (Last 24 hours) at 06/16/2021 2123 Last data filed at 06/16/2021 1906 Gross per 24 hour  Intake 660 ml  Output 970 ml  Net -310 ml   Net IO Since Admission: -6,169.77 mL [06/16/21 2123]  Pertinent Labs: CBC Latest Ref Rng & Units 06/16/2021 06/15/2021 06/14/2021  WBC 4.0 - 10.5 K/uL 9.4 10.3 9.9  Hemoglobin 13.0 - 17.0 g/dL 9.1(L) 8.6(L) 8.5(L)  Hematocrit 39.0 - 52.0 % 27.7(L) 26.4(L) 26.0(L)  Platelets 150 - 400 K/uL 288 292 298    CMP Latest Ref Rng & Units 06/16/2021 06/15/2021 06/14/2021  Glucose 70 - 99 mg/dL 142(H) 140(H) 188(H)  BUN 8 - 23 mg/dL 27(H) 26(H) 26(H)  Creatinine 0.61 - 1.24 mg/dL 1.51(H) 1.46(H) 1.39(H)  Sodium 135 - 145 mmol/L 133(L) 133(L) 132(L)   Potassium 3.5 - 5.1 mmol/L 4.3 4.0 3.7  Chloride 98 - 111 mmol/L 105 104 105  CO2 22 - 32 mmol/L 23 23 23   Calcium 8.9 - 10.3 mg/dL 8.5(L) 8.6(L) 8.5(L)  Total Protein 6.5 - 8.1 g/dL - - -  Total Bilirubin 0.3 - 1.2 mg/dL - - -  Alkaline Phos 38 - 126 U/L - - -  AST 15 - 41 U/L - - -  ALT 0 - 44 U/L - - -    Imaging: No results found.  Assessment/Plan:   Principal Problem:   Acute CVA (cerebrovascular accident) ([place]) Active Problems:   TIA (transient ischemic attack)   Malnutrition of moderate degree   Patient Summary: [name] [name] is a 75 y.o. with a pertinent PMH of A. 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   Dementia (suspected Lewy Body dementia) Delirium due to hospitalization Patient appeared calm and had organized thought process. He was without restraints with a sitter at bedside. Seems to be doing well on new regimen.  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 -SNF placement -Home medications include metoprolol, entresto, and spironolactone. These are currently held due to normotensive pressures. Metoprolol held for concern for bradycardia with rivastigmine.  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  Patient became brady with metoprolol and increased Rivastigmine dosage. We will hold metoprolol with increase in rivastigmine.  Continue to hold spironolactone and Entresto. - Monitor volume  status - Hold metoprolol 25 mg, Entresto, Spironolactone.    #Anemia Stable at 8-9. Do not suspect acute bleed.  - CBC daily  Hyponatremia; resolved -Continue to monitor with BMP   Hypokalemia (resolved)   Leukocytosis (resolved)    Diet:  dysphagia 3 IVF: None,None VTE: Eliquis Code: DNR PT/OT recs: SNF for Subacute PT,. TOC recs: Pending Placement    Scarlett Presto, MD Internal Medicine Resident PGY-1 Pager [phone] Please contact the on call pager after 5 pm and on weekends at [phone].

## 2021-06-16 NOTE — Progress Notes (Cosign Needed Addendum)
HD#20 Subjective:  Overnight Events: NAEO  Patient feeling well this morning, recognized several people in the room, laughing and smiling.  Objective:  Vital signs in last 24 hours: Vitals:   06/16/21 0700 06/16/21 0829 06/16/21 0930 06/16/21 1245  BP:  108/69  113/70  Pulse:  (!) 53 69 72  Resp:  18  20  Temp:  98.4 F (36.9 C)  97.6 F (36.4 C)  TempSrc:  Oral  Oral  SpO2:  100% 100% 100%  Weight: 91.9 kg     Height:       Supplemental O2: Room Air SpO2: 100 %   Physical Exam:  Constitutional: Elderly gentleman resting in bed in no acute distress HENT: normocephalic atraumatic, mucous membranes moist Eyes: conjunctiva non-erythematous Neck: supple Cardiovascular: regular rate and rhythm, no m/r/g, no pitting edema Pulmonary/Chest: normal work of breathing on room air, lungs clear to auscultation bilaterally Abdominal: soft, non-tender, non-distended MSK: normal bulk and tone Neurological: alert, answering questions appropriately Skin: warm and dry, normal skin turgor Psych: pleasant affect  Filed Weights   06/11/21 0700 06/15/21 0500 06/16/21 0700  Weight: 86.9 kg 88.2 kg 91.9 kg     Intake/Output Summary (Last 24 hours) at 06/16/2021 1538 Last data filed at 06/16/2021 0815 Gross per 24 hour  Intake 860 ml  Output 920 ml  Net -60 ml   Net IO Since Admission: -6,169.77 mL [06/16/21 1538]  Pertinent Labs: CBC Latest Ref Rng & Units 06/16/2021 06/15/2021 06/14/2021  WBC 4.0 - 10.5 K/uL 9.4 10.3 9.9  Hemoglobin 13.0 - 17.0 g/dL 9.1(L) 8.6(L) 8.5(L)  Hematocrit 39.0 - 52.0 % 27.7(L) 26.4(L) 26.0(L)  Platelets 150 - 400 K/uL 288 292 298    CMP Latest Ref Rng & Units 06/16/2021 06/15/2021 06/14/2021  Glucose 70 - 99 mg/dL 142(H) 140(H) 188(H)  BUN 8 - 23 mg/dL 27(H) 26(H) 26(H)  Creatinine 0.61 - 1.24 mg/dL 1.51(H) 1.46(H) 1.39(H)  Sodium 135 - 145 mmol/L 133(L) 133(L) 132(L)  Potassium 3.5 - 5.1 mmol/L 4.3 4.0 3.7  Chloride 98 - 111 mmol/L 105 104 105   CO2 22 - 32 mmol/L 23 23 23   Calcium 8.9 - 10.3 mg/dL 8.5(L) 8.6(L) 8.5(L)  Total Protein 6.5 - 8.1 g/dL - - -  Total Bilirubin 0.3 - 1.2 mg/dL - - -  Alkaline Phos 38 - 126 U/L - - -  AST 15 - 41 U/L - - -  ALT 0 - 44 U/L - - -    Imaging: No results found.  Assessment/Plan:   Principal Problem:   Acute CVA (cerebrovascular accident) ([place]) Active Problems:   TIA (transient ischemic attack)   Malnutrition of moderate degree   Patient Summary: [name] [name] is a 75 y.o. with a pertinent PMH of A. 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   Dementia (suspected Lewy Body dementia) Delirium due to hospitalization Patient appeared calm and had organized thought process. He was without restraints with a sitter at bedside. Seems to be doing well on new regimen.  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 -SNF placement -  Home medications include metoprolol, entresto, and spironolactone. These are currently held due to normotensive pressures. Metoprolol held for concern for bradycardia with rivastigmine.  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  Patient became brady with metoprolol and increased Rivastigmine dosage. We will hold metoprolol with increase in rivastigmine.  Continue to hold spironolactone and Entresto. - Monitor volume status - Hold metoprolol 25 mg, Entresto, Spironolactone.    Hyponatremia;  stable Sodium 133  -Continue to monitor with BMP   Hypokalemia (resolved)   Leukocytosis (resolved)    Diet:  dysphagia 3 IVF: None,None VTE: Eliquis Code: DNR PT/OT recs: SNF for Subacute PT,. TOC recs: Pending Placement   Scarlett Presto, MD Internal Medicine Resident PGY-1 Pager [phone] Please contact the on call pager after 5 pm and on weekends at [phone].

## 2021-06-17 LAB — GLUCOSE, CAPILLARY
Glucose-Capillary: 110 mg/dL — ABNORMAL HIGH (ref 70–99)
Glucose-Capillary: 211 mg/dL — ABNORMAL HIGH (ref 70–99)
Glucose-Capillary: 134 mg/dL — ABNORMAL HIGH (ref 70–99)
Glucose-Capillary: 122 mg/dL — ABNORMAL HIGH (ref 70–99)

## 2021-06-17 LAB — BASIC METABOLIC PANEL
Anion gap: 6 (ref 5–15)
BUN: 24 mg/dL — ABNORMAL HIGH (ref 8–23)
CO2: 23 mmol/L (ref 22–32)
Calcium: 8.6 mg/dL — ABNORMAL LOW (ref 8.9–10.3)
Chloride: 106 mmol/L (ref 98–111)
Creatinine, Ser: 1.3 mg/dL — ABNORMAL HIGH (ref 0.61–1.24)
GFR, Estimated: 57 mL/min — ABNORMAL LOW (ref >60)
Glucose, Bld: 134 mg/dL — ABNORMAL HIGH (ref 70–99)
Potassium: 4.3 mmol/L (ref 3.5–5.1)
Sodium: 135 mmol/L (ref 135–145)

## 2021-06-17 LAB — CBC
HCT: 27.1 % — ABNORMAL LOW (ref 39.0–52.0)
Hemoglobin: 9.1 g/dL — ABNORMAL LOW (ref 13.0–17.0)
MCH: 33.6 pg (ref 26.0–34.0)
MCHC: 33.6 g/dL (ref 30.0–36.0)
MCV: 100 fL (ref 80.0–100.0)
Platelets: 264 10*3/uL (ref 150–400)
RBC: 2.71 MIL/uL — ABNORMAL LOW (ref 4.22–5.81)
RDW: 13.9 % (ref 11.5–15.5)
WBC: 8.8 10*3/uL (ref 4.0–10.5)
nRBC: 0 % (ref 0.0–0.2)

## 2021-06-17 NOTE — Progress Notes (Signed)
Nutrition Follow-up  DOCUMENTATION CODES:  Non-severe (moderate) malnutrition in context of chronic illness  INTERVENTION:  -continue Ensure Enlive po TID, each supplement provides 350 kcal and 20 grams of protein -continue MVI with minerals daily -continue feeding assistance with meals.  NUTRITION DIAGNOSIS:  Moderate Malnutrition related to chronic illness (dementia) as evidenced by moderate fat depletion, moderate muscle depletion. ongoing  GOAL:  Patient will meet greater than or equal to 90% of their needs progressing  MONITOR:  PO intake, Supplement acceptance, Labs, Weight trends, I & O's  REASON FOR ASSESSMENT:  Consult Assessment of nutrition requirement/status  ASSESSMENT:  75 yo male with a PMH of  A. 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  Pt's mentation/affect improving and pt is smiling and excited to speak with RD, though remains poor historian 2/2 dementia. Discussed pt with RN who reports pt has had continued good appetite and supplement acceptance. Last 8 meals documented as 20-100% completion (82% average meal intake). Continue current nutrition plan of care at this time.   UOP: 845ml documented x24 hours I/O: -6969ml since admit  Medications: Ensure TID, folvite, SSI, remeron, semglee, mvi with minerals, senokot-s, IV abx, miralax Labs: BUN 24 (H), Cr 1.30 (H) CBGs 86-227 x 24 hours  Diet Order:   Diet Order             DIET DYS 3 Room service appropriate? No; Fluid consistency: Thin  Diet effective 1000                  EDUCATION NEEDS:  Not appropriate for education at this time  Skin:  Skin Assessment: Reviewed RN Assessment  Last BM:  10/30  Height:  Ht Readings from Last 1 Encounters:  06/06/21 6\' 2"  (1.88 m)   Weight:  Wt Readings from Last 1 Encounters:  06/16/21 91.9 kg   BMI:  Body mass index is 26.01 kg/m.  Estimated Nutritional Needs:   Kcal:  2000-2200 Protein:  115-130 grams Fluid:  >2 L   Larkin Ina, MS, RD, LDN (she/her/hers) RD pager number and weekend/on-call pager number located in [place].]

## 2021-06-17 NOTE — Progress Notes (Signed)
Occupational Therapy Treatment Patient Details Name: [name] [name] MRN: [id number] DOB: 01/15/1946 Today's Date: 06/17/2021   History of present illness [name] [name] is a 75 y/o male admitted 05/25/21 with c/o left leg weakness. [name] [name] has dementia with short-term memory deficits at baseline. 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 PMH: A. 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   OT comments  Pt participating in multiple activities this session. His activity tolerance is improving and he is able to ambulate for further distances. Pt completed grooming at the sink with supervision for safety and minimal verbal cuing for sequencing. Additionally, pt worked on a seek and find activity in the room, where he used his RW, worked on standing balance as he reached out of his base of support to grab cups "hidden" in the room, then searched the room for the next cup. Pt continuing to benefit from SNF level therapies to address balance concerns, weakness, activity tolerance, and safety. OT will follow up acutely.    Recommendations for follow up therapy are one component of a multi-disciplinary discharge planning process, led by the attending physician.  Recommendations may be updated based on patient status, additional functional criteria and insurance authorization.    Follow Up Recommendations  Skilled nursing-short term rehab (<3 hours/day)    Assistance Recommended at Discharge Frequent or constant Supervision/Assistance  Equipment Recommendations  None recommended by OT    Recommendations for Other Services      Precautions / Restrictions Precautions Precautions: Fall Precaution Comments: confused, sitter Restrictions Weight Bearing Restrictions: No       Mobility Bed Mobility               General bed mobility comments: In recliner    Transfers Overall transfer level: Needs  assistance Equipment used: Rolling walker (2 wheels) Transfers: Sit to/from Stand Sit to Stand: Min guard           General transfer comment: Min guard tocome to standing and steady,requiring min verbal cuing for safety and sequencing     Balance Overall balance assessment: Needs assistance Sitting-balance support: Feet supported;No upper extremity supported Sitting balance-Leahy Scale: Good     Standing balance support: Single extremity supported;During functional activity Standing balance-Leahy Scale: Fair Standing balance comment: dynamic balance requires assist, static balance good                           ADL either performed or assessed with clinical judgement   ADL Overall ADL's : Needs assistance/impaired     Grooming: Wash/dry hands;Wash/dry face;Oral care;Brushing hair;Supervision/safety Grooming Details (indicate cue type and reason): completed at the sink, sup for safety         Upper Body Dressing : Minimal assistance;Standing Upper Body Dressing Details (indicate cue type and reason): donned a back gown, min A for balance while completeing a dynamic standing task     Toilet Transfer: Min guard;Ambulation Toilet Transfer Details (indicate cue type and reason): Pt completed toileting thi ssession Toileting- Clothing Manipulation and Hygiene: Minimal assistance;Sitting/lateral lean;Sit to/from stand Toileting - Clothing Manipulation Details (indicate cue type and reason): in bathroom, min A for thoroughness and safety     Functional mobility during ADLs: Min guard;Rolling walker (2 wheels) General ADL Comments: pt mostly limited by poor cognition this session; he is physically improving wtih strength and ambulation.     Vision   Vision Assessment?:  No apparent visual deficits Additional Comments: Pt read a card from family this session and participated in a seek and find activity   Perception Perception Perception: Not tested   Praxis  Praxis Praxis: Intact    Cognition Arousal/Alertness: Awake/alert Behavior During Therapy: WFL for tasks assessed/performed Overall Cognitive Status: History of cognitive impairments - at baseline                                 General Comments: pt cooperative and pleasant this session          Exercises Other Exercises Other Exercises: Seek and find activity in room, pt ambulating throughout the room finding cups A-C in the correct order.   Shoulder Instructions       General Comments VSS on RA, catheter intact, sitter in room    Pertinent Vitals/ Pain       Pain Assessment: No/denies pain  Home Living                                          Prior Functioning/Environment              Frequency  Min 1X/week        Progress Toward Goals  OT Goals(current goals can now be found in the care plan section)  Progress towards OT goals: Progressing toward goals  Acute Rehab OT Goals OT Goal Formulation: With patient Potential to Achieve Goals: Fair ADL Goals Pt Will Perform Grooming: with min guard assist;standing Pt Will Perform Lower Body Bathing: with min assist;sit to/from stand Pt Will Perform Lower Body Dressing: with min guard assist;sit to/from stand Pt Will Transfer to Toilet: with min guard assist;ambulating Pt Will Perform Toileting - Clothing Manipulation and hygiene: with min guard assist;sitting/lateral leans Pt Will Perform Tub/Shower Transfer: with min guard assist;ambulating;tub bench  Plan Discharge plan remains appropriate;Frequency remains appropriate    Co-evaluation    PT/OT/SLP Co-Evaluation/Treatment: Yes Reason for Co-Treatment: Necessary to address cognition/behavior during functional activity;For patient/therapist safety;To address functional/ADL transfers   OT goals addressed during session: ADL's and self-care;Strengthening/ROM      AM-PAC OT "6 Clicks" Daily Activity     Outcome Measure    Help from another person eating meals?: None Help from another person taking care of personal grooming?: A Little Help from another person toileting, which includes using toliet, bedpan, or urinal?: A Little Help from another person bathing (including washing, rinsing, drying)?: A Lot Help from another person to put on and taking off regular upper body clothing?: A Little Help from another person to put on and taking off regular lower body clothing?: A Lot 6 Click Score: 17    End of Session Equipment Utilized During Treatment: Gait belt;Rolling walker (2 wheels)  OT Visit Diagnosis: Unsteadiness on feet (R26.81);Other abnormalities of gait and mobility (R26.89);Muscle weakness (generalized) (M62.81);Repeated falls (R29.6);History of falling (Z91.81);Pain Pain - Right/Left: Right Pain - part of body: Knee   Activity Tolerance Patient tolerated treatment well   Patient Left in chair;with call bell/phone within reach;with chair alarm set;with nursing/sitter in room   Nurse Communication Mobility status        Time: 1610-9604 OT Time Calculation (min): 28 min  Charges: OT General Charges $OT Visit: 1 Visit OT Treatments $Therapeutic Activity: 8-22 mins  Boss Danielsen H., OTR/L Acute Rehabilitation  Alanie Syler Elane  Yolanda Bonine 06/17/2021, 6:13 PM

## 2021-06-17 NOTE — Progress Notes (Signed)
Physical Therapy Treatment Patient Details Name: [name] [name] MRN: [id number] DOB: [birth year] Today's Date: 06/17/2021   History of Present Illness [name] [name] is a 75 y/o male admitted 05/25/21 with c/o left leg weakness. Mr. Segreto has dementia with short-term memory deficits at baseline. 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 PMH: A. 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    PT Comments    Pt received in recliner and sitter present in room, pt calm and cooperative with good participation and improved activity tolerance. Emphasis on safe hand placement with transfers, visual scanning activity during gait task, static/dynamic standing balance challenges and safe use of RW during gait progression in room/hallway. Pt tending to bump into objects on L and R sides of RW when not cued to avoid them. Pt continues to benefit from PT services to progress toward functional mobility goals.    Recommendations for follow up therapy are one component of a multi-disciplinary discharge planning process, led by the attending physician.  Recommendations may be updated based on patient status, additional functional criteria and insurance authorization.  Follow Up Recommendations  Skilled nursing-short term rehab (<3 hours/day)     Assistance Recommended at Discharge Frequent or constant Supervision/Assistance  Equipment Recommendations  None recommended by PT    Recommendations for Other Services       Precautions / Restrictions Precautions Precautions: Fall Precaution Comments: confused, sitter Restrictions Weight Bearing Restrictions: No     Mobility  Bed Mobility               General bed mobility comments: In recliner    Transfers Overall transfer level: Needs assistance Equipment used: Rolling walker (2 wheels) Transfers: Sit to/from Stand Sit to Stand: Min guard;Min  assist           General transfer comment: Min guard to stand and steady, up to minA for stand>sit and requiring min verbal cuing for safety and sequencing    Ambulation/Gait Ambulation/Gait assistance: Min assist;+2 safety/equipment Gait Distance (Feet): 100 Feet (143ft, seated break, then 4ft in room) Assistive device: Rolling walker (2 wheels) Gait Pattern/deviations: Step-through pattern;Decreased stride length;Trunk flexed     General Gait Details: pt with freq standing stops due to fatigue and c/o mild R knee pain intermittently   Stairs             Wheelchair Mobility    Modified Rankin (Stroke Patients Only) Modified Rankin (Stroke Patients Only) Pre-Morbid Rankin Score: No significant disability Modified Rankin: Moderately severe disability     Balance Overall balance assessment: Needs assistance Sitting-balance support: Feet supported;No upper extremity supported Sitting balance-Leahy Scale: Good     Standing balance support: Single extremity supported;During functional activity Standing balance-Leahy Scale: Fair Standing balance comment: dynamic balance requires assist, static balance good while standing at sink                            Cognition Arousal/Alertness: Awake/alert Behavior During Therapy: WFL for tasks assessed/performed Overall Cognitive Status: History of cognitive impairments - at baseline                                 General Comments: pt cooperative and pleasant this session, baseline memory deficits and not oriented to situation/location/time but participatory as able.  Exercises Other Exercises Other Exercises: Seek and find activity in room, pt ambulating throughout the room finding cups A-C in the correct order, reaching 2-4" outside BOS to obtain cups.    General Comments General comments (skin integrity, edema, etc.): VSS on RA      Pertinent Vitals/Pain Pain Assessment:  Faces Faces Pain Scale: Hurts little more Pain Location: R knee intermittently with longer gait trial Pain Descriptors / Indicators: Aching Pain Intervention(s): Limited activity within patient's tolerance;Monitored during session;Repositioned    Home Living                          Prior Function            PT Goals (current goals can now be found in the care plan section) Acute Rehab PT Goals Patient Stated Goal: to walk around PT Goal Formulation: Patient unable to participate in goal setting Time For Goal Achievement: 06/23/21 Progress towards PT goals: Progressing toward goals    Frequency    Min 3X/week      PT Plan Current plan remains appropriate    Co-evaluation PT/OT/SLP Co-Evaluation/Treatment: Yes Reason for Co-Treatment: Complexity of the patient's impairments (multi-system involvement);Necessary to address cognition/behavior during functional activity;For patient/therapist safety;To address functional/ADL transfers PT goals addressed during session: Mobility/safety with mobility;Balance;Proper use of DME;Strengthening/ROM OT goals addressed during session: ADL's and self-care;Strengthening/ROM      AM-PAC PT "6 Clicks" Mobility   Outcome Measure  Help needed turning from your back to your side while in a flat bed without using bedrails?: A Little Help needed moving from lying on your back to sitting on the side of a flat bed without using bedrails?: A Little Help needed moving to and from a bed to a chair (including a wheelchair)?: A Little Help needed standing up from a chair using your arms (e.g., wheelchair or bedside chair)?: A Little Help needed to walk in hospital room?: A Little Help needed climbing 3-5 steps with a railing? : Total 6 Click Score: 16    End of Session Equipment Utilized During Treatment: Gait belt Activity Tolerance: Patient tolerated treatment well Patient left: in chair;with call bell/phone within reach;with  nursing/sitter in room Nurse Communication: Mobility status PT Visit Diagnosis: Other abnormalities of gait and mobility (R26.89);Hemiplegia and hemiparesis;Muscle weakness (generalized) (M62.81) Hemiplegia - Right/Left: Left Hemiplegia - dominant/non-dominant: Non-dominant Hemiplegia - caused by: Cerebral infarction     Time: 1696-7893 PT Time Calculation (min) (ACUTE ONLY): 28 min  Charges:  $Gait Training: 8-22 mins                     Geanna Divirgilio P., PTA Acute Rehabilitation Services Pager: [phone] Office: [place] 06/17/2021, 6:25 PM

## 2021-06-18 LAB — GLUCOSE, CAPILLARY
Glucose-Capillary: 115 mg/dL — ABNORMAL HIGH (ref 70–99)
Glucose-Capillary: 200 mg/dL — ABNORMAL HIGH (ref 70–99)
Glucose-Capillary: 129 mg/dL — ABNORMAL HIGH (ref 70–99)
Glucose-Capillary: 197 mg/dL — ABNORMAL HIGH (ref 70–99)

## 2021-06-18 MED ORDER — RIVASTIGMINE TARTRATE 1.5 MG PO CAPS
3.0000 mg | ORAL_CAPSULE | Freq: Two times a day (BID) | ORAL | Status: DC
  Administered 2021-06-18 – 2021-06-30 (×24): 3 mg via ORAL
  Filled 2021-06-18 (×25): qty 2

## 2021-06-18 MED ORDER — GLYCERIN (LAXATIVE) 2 G RE SUPP
1.0000 | Freq: Every day | RECTAL | Status: DC | PRN
  Administered 2021-06-18 – 2021-06-19 (×2): 1 via RECTAL
  Filled 2021-06-18 (×5): qty 1

## 2021-06-18 NOTE — Progress Notes (Signed)
HD#22 Subjective:  Overnight Events: NAEO  Initially sleeping and woken up for interview. Feeling ok this morning, slept pretty well. Says that he quit smoking when he was 75 years old. Went back to sleep after brief discussion  Objective:  Vital signs in last 24 hours: Vitals:   06/17/21 1932 06/17/21 2300 06/18/21 0358 06/18/21 0821  BP: 115/78 (!) 112/94 (!) 153/87 109/66  Pulse: (!) 42 (!) 55 (!) 50 75  Resp: 18 19 19 18   Temp: 98.7 F (37.1 C) 98.5 F (36.9 C) 98.4 F (36.9 C) 97.6 F (36.4 C)  TempSrc: Oral Oral Oral Oral  SpO2: 100% 98% 100% 100%  Weight:      Height:       Supplemental O2: Room Air SpO2: 100 %   Physical Exam:  Constitutional: Elderly man sleeping in bed, in no acute distress HENT: normocephalic atraumatic, mucous membranes moist Eyes: conjunctiva non-erythematous Neck: supple Cardiovascular: regular rate and rhythm, no m/r/g Pulmonary/Chest: normal work of breathing on room air, lungs clear to auscultation bilaterally Abdominal: soft, non-tender, non-distended MSK: normal bulk and tone Neurological: Wakes up to voice, answering questions appropriately Skin: warm and dry normal skin turgor Psych: Pleasant affect  Filed Weights   06/11/21 0700 06/15/21 0500 06/16/21 0700  Weight: 86.9 kg 88.2 kg 91.9 kg     Intake/Output Summary (Last 24 hours) at 06/18/2021 0857 Last data filed at 06/18/2021 0654 Gross per 24 hour  Intake 640 ml  Output 2600 ml  Net -1960 ml   Net IO Since Admission: -8,289.77 mL [06/18/21 0857]  Pertinent Labs: CBC Latest Ref Rng & Units 06/17/2021 06/16/2021 06/15/2021  WBC 4.0 - 10.5 K/uL 8.8 9.4 10.3  Hemoglobin 13.0 - 17.0 g/dL 9.1(L) 9.1(L) 8.6(L)  Hematocrit 39.0 - 52.0 % 27.1(L) 27.7(L) 26.4(L)  Platelets 150 - 400 K/uL 264 288 292    CMP Latest Ref Rng & Units 06/17/2021 06/16/2021 06/15/2021  Glucose 70 - 99 mg/dL 134(H) 142(H) 140(H)  BUN 8 - 23 mg/dL 24(H) 27(H) 26(H)  Creatinine 0.61 - 1.24  mg/dL 1.30(H) 1.51(H) 1.46(H)  Sodium 135 - 145 mmol/L 135 133(L) 133(L)  Potassium 3.5 - 5.1 mmol/L 4.3 4.3 4.0  Chloride 98 - 111 mmol/L 106 105 104  CO2 22 - 32 mmol/L 23 23 23   Calcium 8.9 - 10.3 mg/dL 8.6(L) 8.5(L) 8.6(L)  Total Protein 6.5 - 8.1 g/dL - - -  Total Bilirubin 0.3 - 1.2 mg/dL - - -  Alkaline Phos 38 - 126 U/L - - -  AST 15 - 41 U/L - - -  ALT 0 - 44 U/L - - -    Imaging: No results found.  Assessment/Plan:   Principal Problem:   Acute CVA (cerebrovascular accident) ([place]) Active Problems:   TIA (transient ischemic attack)   Malnutrition of moderate degree   Patient Summary: [name] [name] is a 75 y.o. with a pertinent PMH of A. 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   Dementia (suspected Lewy Body dementia) Delirium due to hospitalization Patient appeared calm and had organized thought process. He was without restraints with a sitter at bedside. Seems to be doing well on new regimen.  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 -SNF placement -Home medications include metoprolol, entresto, and spironolactone. These are currently held due to normotensive pressures. Metoprolol held for concern for bradycardia with rivastigmine.  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  Patient became brady with metoprolol and increased Rivastigmine dosage. We will hold metoprolol with increase in  rivastigmine.  Continue to hold spironolactone and Entresto. - Monitor volume status - Hold metoprolol 25 mg, Entresto, Spironolactone.    #Anemia Stable at 8-9. Do not suspect acute bleed.    Hyponatremia; resolved   Hypokalemia (resolved)   Leukocytosis (resolved)     Diet:  dysphagia 3 IVF: None,None VTE: Eliquis Code: DNR PT/OT recs: SNF for Subacute PT,. TOC recs: Pending Placement  Scarlett Presto, MD Internal Medicine Resident PGY-1 Pager [phone] Please contact the on call pager after 5 pm and on weekends at [phone].

## 2021-06-19 LAB — GLUCOSE, CAPILLARY
Glucose-Capillary: 123 mg/dL — ABNORMAL HIGH (ref 70–99)
Glucose-Capillary: 158 mg/dL — ABNORMAL HIGH (ref 70–99)
Glucose-Capillary: 132 mg/dL — ABNORMAL HIGH (ref 70–99)
Glucose-Capillary: 174 mg/dL — ABNORMAL HIGH (ref 70–99)
Glucose-Capillary: 115 mg/dL — ABNORMAL HIGH (ref 70–99)

## 2021-06-19 MED ORDER — POLYETHYLENE GLYCOL 3350 17 G PO PACK
17.0000 g | PACK | Freq: Two times a day (BID) | ORAL | Status: DC
  Administered 2021-06-19 – 2021-06-20 (×3): 17 g via ORAL
  Filled 2021-06-19 (×3): qty 1

## 2021-06-19 NOTE — Progress Notes (Signed)
Patient is active with the [facility]: Dr Cherlynn Kaiser SWLesly Rubenstein Wiley [phone]

## 2021-06-19 NOTE — Progress Notes (Signed)
HD#23 Subjective:  Overnight Events: NAEO  Patient feels ok this morning. Does not have any problem with appetite but does say it has been a while since he has pooped. Does not have any pain in his belly  Objective:  Vital signs in last 24 hours: Vitals:   06/19/21 0031 06/19/21 0446 06/19/21 0756 06/19/21 1300  BP: 126/86 115/74 119/81 121/85  Pulse: (!) 44 63 69 74  Resp: 18 16 20 18   Temp: 98.3 F (36.8 C) 98 F (36.7 C) 98.4 F (36.9 C) 98 F (36.7 C)  TempSrc: Oral Oral Oral Oral  SpO2: 99% 100% 100% 100%  Weight:      Height:       Supplemental O2: Room Air SpO2: 100 %   Physical Exam:  Constitutional: elderly man sitting in chair, in no acute distress HENT: normocephalic atraumatic, mucous membranes moist Eyes: conjunctiva non-erythematous Neck: supple Cardiovascular: regular rate and rhythm, no m/r/g Pulmonary/Chest: normal work of breathing on room air, lungs clear to auscultation bilaterally Abdominal: soft, non-tender, mildly distended MSK: normal bulk and tone Neurological: alert and answering questions appropriately Skin: warm and dry Psych: pleasant affect, redirectable  Filed Weights   06/11/21 0700 06/15/21 0500 06/16/21 0700  Weight: 86.9 kg 88.2 kg 91.9 kg     Intake/Output Summary (Last 24 hours) at 06/19/2021 1308 Last data filed at 06/19/2021 0757 Gross per 24 hour  Intake 660 ml  Output 1450 ml  Net -790 ml   Net IO Since Admission: -9,259.77 mL [06/19/21 1308]  Pertinent Labs: CBC Latest Ref Rng & Units 06/17/2021 06/16/2021 06/15/2021  WBC 4.0 - 10.5 K/uL 8.8 9.4 10.3  Hemoglobin 13.0 - 17.0 g/dL 9.1(L) 9.1(L) 8.6(L)  Hematocrit 39.0 - 52.0 % 27.1(L) 27.7(L) 26.4(L)  Platelets 150 - 400 K/uL 264 288 292    CMP Latest Ref Rng & Units 06/17/2021 06/16/2021 06/15/2021  Glucose 70 - 99 mg/dL 134(H) 142(H) 140(H)  BUN 8 - 23 mg/dL 24(H) 27(H) 26(H)  Creatinine 0.61 - 1.24 mg/dL 1.30(H) 1.51(H) 1.46(H)  Sodium 135 - 145 mmol/L 135  133(L) 133(L)  Potassium 3.5 - 5.1 mmol/L 4.3 4.3 4.0  Chloride 98 - 111 mmol/L 106 105 104  CO2 22 - 32 mmol/L 23 23 23   Calcium 8.9 - 10.3 mg/dL 8.6(L) 8.5(L) 8.6(L)  Total Protein 6.5 - 8.1 g/dL - - -  Total Bilirubin 0.3 - 1.2 mg/dL - - -  Alkaline Phos 38 - 126 U/L - - -  AST 15 - 41 U/L - - -  ALT 0 - 44 U/L - - -    Imaging: No results found.  Assessment/Plan:   Principal Problem:   Acute CVA (cerebrovascular accident) ([place]) Active Problems:   TIA (transient ischemic attack)   Malnutrition of moderate degree   Patient Summary: [name] [name] is a 75 y.o. with a pertinent PMH of A. 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  Constipation Patient has not had a bowel movement in several days despite suppository yesterday. Will try another suppository today - increase miralax to 17 BID - continue senna and PRN dulcolax suppository  Urinary retention Had been requiring a foley, removed that today and will see how he responds to a voiding trial. - f/u bladder scans   Dementia (suspected Lewy Body dementia) Delirium due to hospitalization Patient appeared calm and had  organized thought process. He was without restraints with a sitter at bedside. Seems to be doing well on new regimen.  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 -SNF placement -Home medications include metoprolol, entresto, and spironolactone. These are currently held due to normotensive pressures. Metoprolol held for concern for bradycardia with rivastigmine.  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  Patient became brady with metoprolol and increased Rivastigmine dosage. We will hold metoprolol with increase in rivastigmine.  Continue to hold spironolactone and Entresto. - Monitor volume status - Hold metoprolol 25 mg, Entresto, Spironolactone.    #Anemia Stable at 8-9. Do not suspect acute bleed.    Hyponatremia; resolved   Hypokalemia (resolved)   Leukocytosis (resolved)   Diet:  dysphagia 3 IVF: None,None VTE: Eliquis Code: DNR PT/OT recs: SNF TOC recs: Pending Placement   Scarlett Presto, MD Internal Medicine Resident PGY-1 Pager [phone] Please contact the on call pager after 5 pm and on weekends at [phone].

## 2021-06-19 NOTE — Progress Notes (Signed)
Physical Therapy Treatment Patient Details Name: [name] [name] MRN: [id number] DOB: [birth year] Today's Date: 06/19/2021   History of Present Illness [name] is a 75 y/o male admitted 05/25/21 with c/o left leg weakness. [name] has dementia with short-term memory deficits at baseline. 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 PMH: A. 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    PT Comments    Pt confused about where he is, states he is home in a sense, cannot state he is in hospital without PT cuing. Pt tolerating limited gait distance secondary to R knee pain and not having pants on (pt has no pants in room). PT to progress mobility as tolerated.     Recommendations for follow up therapy are one component of a multi-disciplinary discharge planning process, led by the attending physician.  Recommendations may be updated based on patient status, additional functional criteria and insurance authorization.  Follow Up Recommendations  Skilled nursing-short term rehab (<3 hours/day)     Assistance Recommended at Discharge Frequent or constant Supervision/Assistance  Equipment Recommendations  None recommended by PT    Recommendations for Other Services       Precautions / Restrictions Precautions Precautions: Fall Precaution Comments: confused, sitter Restrictions Weight Bearing Restrictions: No     Mobility  Bed Mobility Overal bed mobility: Needs Assistance             General bed mobility comments: up with NT    Transfers Overall transfer level: Needs assistance   Transfers: Sit to/from Stand Sit to Stand: Min guard           General transfer comment: min guard for stand to sit for safety, pt reaching back for recliner    Ambulation/Gait Ambulation/Gait assistance: Min assist Gait Distance (Feet): 45 Feet Assistive device: Rolling walker (2  wheels) Gait Pattern/deviations: Step-through pattern;Decreased stride length;Trunk flexed Gait velocity: dec   General Gait Details: light steadying assist, pt declines further gait secondary to knee pain and not having pants   Stairs             Wheelchair Mobility    Modified Rankin (Stroke Patients Only) Modified Rankin (Stroke Patients Only) Pre-Morbid Rankin Score: No significant disability Modified Rankin: Moderately severe disability     Balance Overall balance assessment: Needs assistance Sitting-balance support: Feet supported;No upper extremity supported Sitting balance-Leahy Scale: Good     Standing balance support: Single extremity supported;During functional activity Standing balance-Leahy Scale: Fair Standing balance comment: dynamic balance requires assist, static balance good while standing at sink                            Cognition Arousal/Alertness: Awake/alert Behavior During Therapy: WFL for tasks assessed/performed Overall Cognitive Status: History of cognitive impairments - at baseline                                 General Comments: pt cooperative and pleasant this session        Exercises      General Comments        Pertinent Vitals/Pain Pain Assessment: Faces Faces Pain Scale: Hurts little more Pain Location: R knee Pain Descriptors / Indicators: Sore Pain Intervention(s): Limited activity within patient's tolerance;Monitored during session;Repositioned    Home Living  Prior Function            PT Goals (current goals can now be found in the care plan section) Acute Rehab PT Goals Patient Stated Goal: to walk around PT Goal Formulation: Patient unable to participate in goal setting Time For Goal Achievement: 06/23/21 Progress towards PT goals: Progressing toward goals    Frequency    Min 3X/week      PT Plan Current plan remains appropriate     Co-evaluation              AM-PAC PT "6 Clicks" Mobility   Outcome Measure  Help needed turning from your back to your side while in a flat bed without using bedrails?: A Little Help needed moving from lying on your back to sitting on the side of a flat bed without using bedrails?: A Little Help needed moving to and from a bed to a chair (including a wheelchair)?: A Little Help needed standing up from a chair using your arms (e.g., wheelchair or bedside chair)?: A Little Help needed to walk in hospital room?: A Little Help needed climbing 3-5 steps with a railing? : A Lot 6 Click Score: 17    End of Session   Activity Tolerance: Patient tolerated treatment well Patient left: in chair;with call bell/phone within reach;with nursing/sitter in room Nurse Communication: Mobility status PT Visit Diagnosis: Other abnormalities of gait and mobility (R26.89);Hemiplegia and hemiparesis;Muscle weakness (generalized) (M62.81) Hemiplegia - Right/Left: Left Hemiplegia - dominant/non-dominant: Non-dominant Hemiplegia - caused by: Cerebral infarction     Time: 5462-7035 PT Time Calculation (min) (ACUTE ONLY): 13 min  Charges:  $Gait Training: 8-22 mins                     Stacie Glaze, PT DPT Acute Rehabilitation Services Pager [phone]  Office [phone]    Louis Matte 06/19/2021, 4:37 PM

## 2021-06-19 NOTE — TOC Progression Note (Signed)
Transition of Care [facility]) - Progression Note    Patient Details  Name: [name] [name] MRN: [id number] Date of Birth: Sep 16, 1945  Transition of Care [facility]) CM/SW [place],  Phone Number: 06/19/2021, 3:44 PM  Clinical Narrative:   CSW noting that patient has not exhibited aggressive behavior, asking [facility] to come back and assess patient. Awaiting on their assessment to determine if they will offer a bed or not. CSW to follow.    Expected Discharge Plan: [place] Barriers to Discharge: Continued Medical Work up, Requiring sitter/restraints  Expected Discharge Plan and Services Expected Discharge Plan: [place] Choice: [place] arrangements for the past 2 months: Single Family Home                                       Social Determinants of Health (SDOH) Interventions    Readmission Risk Interventions No flowsheet data found.

## 2021-06-19 NOTE — Plan of Care (Signed)
  Problem: Health Behavior/Discharge Planning: Goal: Ability to manage health-related needs will improve Outcome: Progressing   Problem: Clinical Measurements: Goal: Diagnostic test results will improve Outcome: Progressing   Problem: Nutrition: Goal: Adequate nutrition will be maintained Outcome: Progressing   Problem: Coping: Goal: Level of anxiety will decrease Outcome: Progressing   Problem: Elimination: Goal: Will not experience complications related to bowel motility Outcome: Progressing Goal: Will not experience complications related to urinary retention Outcome: Progressing   Problem: Safety: Goal: Ability to remain free from injury will improve Outcome: Progressing   Problem: Skin Integrity: Goal: Risk for impaired skin integrity will decrease Outcome: Progressing

## 2021-06-19 NOTE — Progress Notes (Signed)
Lab informed this Probation officer that patient refused labs stating hat he would jump on her if she tried to touch him. Upon entering room, patient resting. States he does not need them. No distress noted.

## 2021-06-19 NOTE — Progress Notes (Signed)
   Palliative Medicine Inpatient Follow Up Note  Consulting Provider: Riesa Pope, MD   Reason for consult:   Palliative Care Consult Services Palliative Medicine Consult    Symptom Management Consult  Reason for Consult? Goals of care discussion with patietn's wife. Currently has severe dementia.    HPI:  Per intake H&P --> [name] [name] is a 75 y.o. with a pertinent PMH of A. 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   Palliative care has been asked to get involved to discuss goals of care in the setting of progressive dementia.  Today's Discussion (06/19/2021):  *Please note that this is a verbal dictation therefore any spelling or grammatical errors are due to the "[place] [employer]" system interpretation.  Chart reviewed.   I met with [name] [name] at bedside this morning.  He was in good spirits and performing a bath by the sink with his nursing technician.  He seemed to be able to converse with me fluidly and did not discuss any concerns this morning.  I have reached out to the clinical social work team who share with me that [name] [name] is being considered for memory care clinic.  We reviewed the importance of ongoing outpatient palliative support upon discharge which will be coordinated.  Objective Assessment: Vital Signs Vitals:   06/19/21 0446 06/19/21 0756  BP: 115/74 119/81  Pulse: 63 69  Resp: 16 20  Temp: 98 F (36.7 C) 98.4 F (36.9 C)  SpO2: 100% 100%    Intake/Output Summary (Last 24 hours) at 06/19/2021 1136 Last data filed at 06/19/2021 0757 Gross per 24 hour  Intake 960 ml  Output 2100 ml  Net -1140 ml   Last Weight  Most recent update: 06/16/2021  7:01 AM    Weight  91.9 kg (202 lb 9.6 oz)            Gen:  Elderly M in NAD HEENT: moist mucous membranes CV: Regular rate and irregular rhythm  PULM: On RA ABD:  soft/nontender  EXT: No edema  Neuro: Alert and oriented x1  SUMMARY OF RECOMMENDATIONS   DNAR/DNI  Delirium Precautions - Continue Seroquel, Exelon, and Remeron  Plan for transition to a memory care unit when able  Palliative care will sign off at this time but please do not hesitate to reconsult [place] if additional care needs arise.  Time Spent: 25 Greater than 50% of the time was spent in counseling and coordination of care ______________________________________________________________________________________ [place] Team Team Cell Phone: [phone] Please utilize secure chat with additional questions, if there is no response within 30 minutes please call the above phone number  Palliative Medicine Team providers are available by phone from 7am to 7pm daily and can be reached through the team cell phone.  Should this patient require assistance outside of these hours, please call the patient's attending physician.

## 2021-06-20 DIAGNOSIS — I639 Cerebral infarction, unspecified: Secondary | ICD-10-CM | POA: Diagnosis not present

## 2021-06-20 LAB — BASIC METABOLIC PANEL
Anion gap: 4 — ABNORMAL LOW (ref 5–15)
BUN: 22 mg/dL (ref 8–23)
CO2: 23 mmol/L (ref 22–32)
Calcium: 8.5 mg/dL — ABNORMAL LOW (ref 8.9–10.3)
Chloride: 109 mmol/L (ref 98–111)
Creatinine, Ser: 1.33 mg/dL — ABNORMAL HIGH (ref 0.61–1.24)
GFR, Estimated: 55 mL/min — ABNORMAL LOW (ref >60)
Glucose, Bld: 107 mg/dL — ABNORMAL HIGH (ref 70–99)
Potassium: 3.8 mmol/L (ref 3.5–5.1)
Sodium: 136 mmol/L (ref 135–145)

## 2021-06-20 LAB — GLUCOSE, CAPILLARY
Glucose-Capillary: 110 mg/dL — ABNORMAL HIGH (ref 70–99)
Glucose-Capillary: 115 mg/dL — ABNORMAL HIGH (ref 70–99)
Glucose-Capillary: 107 mg/dL — ABNORMAL HIGH (ref 70–99)
Glucose-Capillary: 131 mg/dL — ABNORMAL HIGH (ref 70–99)

## 2021-06-20 NOTE — Hospital Course (Addendum)
Laying in bed sleeping. At baseline when woken. Headache in the top of his head, 2 days. States he is urinating fine. Nothing in urinal. No suprapubic pain.    06/24/21 Bladder distention noted on PE Patient hasn't voided yet today

## 2021-06-20 NOTE — Progress Notes (Signed)
HD#24 Subjective:  Overnight Events: In-N-Out cath showed 800 cc  Patient feeling well today. He continues to be in a good mood.  He was cracking jokes this morning and answered questions about schools that he went to when he was young.. Denies any abdominal pain, abdominal distention, nausea or vomiting. States he will try to urinate this morning. Patient had a small bowel movement this morning.   Objective:  Vital signs in last 24 hours: Vitals:   06/19/21 2300 06/20/21 0317 06/20/21 0500 06/20/21 0733  BP: 99/65 109/75  140/86  Pulse: 91 81  (!) 59  Resp: 19 19  18   Temp: 98.3 F (36.8 C) 97.6 F (36.4 C)  97.9 F (36.6 C)  TempSrc: Oral Oral  Oral  SpO2: 100% 100%  100%  Weight:   91.6 kg   Height:       Supplemental O2: Room Air SpO2: 100 %   Physical Exam:  Constitutional: Pleasant, well developed elderly male laying in bed. NAD. Cardiovascular: RRR. No m/r/g.  No LE edema Pulmonary/Chest: CTA B.  No wheezing or rales. Abdominal: Soft.  NT/ND.  Normal bowel sounds. MSK: Normal ROM. Normal bulk and tone Neurological: Alert and oriented x3.  Normal sensations. Skin: warm and dry Psych: Conversational. Appropriate mood and affect.  Filed Weights   06/15/21 0500 06/16/21 0700 06/20/21 0500  Weight: 88.2 kg 91.9 kg 91.6 kg     Intake/Output Summary (Last 24 hours) at 06/20/2021 1044 Last data filed at 06/20/2021 0824 Gross per 24 hour  Intake 840 ml  Output 2305 ml  Net -1465 ml   Net IO Since Admission: -11,274.77 mL [06/20/21 1044]  Pertinent Labs: CBC Latest Ref Rng & Units 06/17/2021 06/16/2021 06/15/2021  WBC 4.0 - 10.5 K/uL 8.8 9.4 10.3  Hemoglobin 13.0 - 17.0 g/dL 9.1(L) 9.1(L) 8.6(L)  Hematocrit 39.0 - 52.0 % 27.1(L) 27.7(L) 26.4(L)  Platelets 150 - 400 K/uL 264 288 292    CMP Latest Ref Rng & Units 06/20/2021 06/17/2021 06/16/2021  Glucose 70 - 99 mg/dL 107(H) 134(H) 142(H)  BUN 8 - 23 mg/dL 22 24(H) 27(H)  Creatinine 0.61 - 1.24 mg/dL 1.33(H)  1.30(H) 1.51(H)  Sodium 135 - 145 mmol/L 136 135 133(L)  Potassium 3.5 - 5.1 mmol/L 3.8 4.3 4.3  Chloride 98 - 111 mmol/L 109 106 105  CO2 22 - 32 mmol/L 23 23 23   Calcium 8.9 - 10.3 mg/dL 8.5(L) 8.6(L) 8.5(L)  Total Protein 6.5 - 8.1 g/dL - - -  Total Bilirubin 0.3 - 1.2 mg/dL - - -  Alkaline Phos 38 - 126 U/L - - -  AST 15 - 41 U/L - - -  ALT 0 - 44 U/L - - -    Imaging: No results found.  Assessment/Plan:   Principal Problem:   Acute CVA (cerebrovascular accident) ([name]) Active Problems:   TIA (transient ischemic attack)   Malnutrition of moderate degree   Patient Summary: [name] [name] is a 75 y.o. with a pertinent PMH of A. 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  Urinary retention History of BPH on Flomax.  Previously on Foley which was removed on 11/04.  Unable to void overnight.  In-N-Out cath showed 800 cc.  Patient able to void about 300 cc this morning with postvoid residual of 50 cc.  -- Continue trial of void with serial  bladder scans -- Flomax 0.4 mg daily   Constipation Patient reports not having any BMs in the last few days while on bowel regiment.  Still no BM after suppository on 11/03 and 11/04.  Per nurse, patient had a small bowel movement in the early morning and another BM close to lunchtime. --Continue MiraLAX 17 g twice daily --Continue senna and as needed Dulcolax suppository --Monitor bowel movements  Dementia (suspected Lewy Body dementia) Delirium due to hospitalization, improved Patient's delirium has improved significantly.  He has not exhibited any aggressive behavior in the last 3 to 4 days. He was conversational and cracking jokes this morning. No sitter at bedside this morning.  Patient medically stable for discharge to SNF.  -- Pending discharge to memory care clinic --Continue Seroquel 50 mg qhs and  12.5 mg in AM  --Continue Remeron 7.5 mg qhs and rivastigmine 3 mg twice daily  Acute lacunar infarct secondary to small vessel disease Stable. PT/OT recommending SNF for acute rehab.   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 Recent echo showed EF 20-25% with biventricular failure. Holding metoprolol in the setting of recent bradycardia with increasing rivastigmine dose. Euvolemic on exam. Heart rate in the 50s to 90s.  -- Continue to hold metoprolol, Entresto and spironolactone  Anemia: Stable hemoglobin at 9.1 Hyponatremia; resolved Hypokalemia (resolved) Leukocytosis (resolved)   Diet:  dysphagia 3 IVF: None,None VTE: Eliquis Code: DNR PT/OT recs: SNF TOC recs: Pending Placement to [name]   [name] Axon, MD 06/20/2021, 10:44 AM IM Resident, PGY-2 Pager: [phone] Internal Medicine Teaching Service [name] [name]

## 2021-06-21 DIAGNOSIS — I639 Cerebral infarction, unspecified: Secondary | ICD-10-CM | POA: Diagnosis not present

## 2021-06-21 LAB — GLUCOSE, CAPILLARY
Glucose-Capillary: 109 mg/dL — ABNORMAL HIGH (ref 70–99)
Glucose-Capillary: 112 mg/dL — ABNORMAL HIGH (ref 70–99)
Glucose-Capillary: 119 mg/dL — ABNORMAL HIGH (ref 70–99)

## 2021-06-21 MED ORDER — GLYCERIN (LAXATIVE) 2 G RE SUPP
1.0000 | Freq: Every day | RECTAL | Status: DC | PRN
  Administered 2021-06-22: 1 via RECTAL
  Filled 2021-06-21 (×3): qty 1

## 2021-06-21 MED ORDER — POLYETHYLENE GLYCOL 3350 17 G PO PACK
17.0000 g | PACK | Freq: Every day | ORAL | Status: DC | PRN
  Administered 2021-06-22: 17 g via ORAL
  Filled 2021-06-21: qty 1

## 2021-06-21 NOTE — Progress Notes (Signed)
Pt toileted frequently this shift. Pt Intermittently unable to urinate. I & O cath this am for 900 ml, PVR - 75 ml, pt denies any discomfort at this time. Will notify MD and monitor closely

## 2021-06-21 NOTE — Plan of Care (Signed)
  Problem: Health Behavior/Discharge Planning: Goal: Ability to manage health-related needs will improve Outcome: Progressing   Problem: Clinical Measurements: Goal: Diagnostic test results will improve Outcome: Progressing   Problem: Nutrition: Goal: Adequate nutrition will be maintained Outcome: Progressing   Problem: Coping: Goal: Level of anxiety will decrease Outcome: Progressing   Problem: Elimination: Goal: Will not experience complications related to bowel motility Outcome: Progressing Goal: Will not experience complications related to urinary retention Outcome: Progressing   Problem: Safety: Goal: Ability to remain free from injury will improve Outcome: Progressing   Problem: Skin Integrity: Goal: Risk for impaired skin integrity will decrease Outcome: Progressing   Problem: Education: Goal: Knowledge of secondary prevention will improve (SELECT ALL) Outcome: Progressing

## 2021-06-21 NOTE — Progress Notes (Signed)
HD#25 Subjective:  Overnight Events: No acute events overnight.  Patient was evaluated at the bedside sitting on side of bed arguing with nurse tech. Patient reports frustration with getting the same breakfast every morning.  Reports he has been moving his bowel but has not been able to urinate this morning.  He denies any dysuria or abdominal pain.  Objective:  Vital signs in last 24 hours: Vitals:   06/20/21 2300 06/21/21 0301 06/21/21 0727 06/21/21 1137  BP: 107/84 (!) 131/105 116/88 (!) 122/105  Pulse: 95 67 (!) 42 (!) 58  Resp: 19 19 20 18   Temp: 98 F (36.7 C) 97.9 F (36.6 C) 98.2 F (36.8 C)   TempSrc: Oral Oral    SpO2: 99% 99% 99% 100%  Weight:      Height:       Supplemental O2: Room Air SpO2: 100 %   Physical Exam:  Constitutional: Frustrated elderly man sitting on side of bed.  No acute distress. Cardiovascular: RRR. No m/r/g. No LE edema Pulmonary/Chest: CTAB. No wheezing or rales. Abdominal: Soft. NT/ND. Normal bowel sounds. MSK: Normal ROM. Normal bulk and tone Neurological: Alert and oriented x3. Moves all extremities. Skin: Warm and dry Psych: Frustrated with NT and breakfast options  Filed Weights   06/15/21 0500 06/16/21 0700 06/20/21 0500  Weight: 88.2 kg 91.9 kg 91.6 kg     Intake/Output Summary (Last 24 hours) at 06/21/2021 1351 Last data filed at 06/21/2021 1096 Gross per 24 hour  Intake 420 ml  Output 975 ml  Net -555 ml   Net IO Since Admission: -11,203.77 mL [06/21/21 1351]  Pertinent Labs: CBC Latest Ref Rng & Units 06/17/2021 06/16/2021 06/15/2021  WBC 4.0 - 10.5 K/uL 8.8 9.4 10.3  Hemoglobin 13.0 - 17.0 g/dL 9.1(L) 9.1(L) 8.6(L)  Hematocrit 39.0 - 52.0 % 27.1(L) 27.7(L) 26.4(L)  Platelets 150 - 400 K/uL 264 288 292    CMP Latest Ref Rng & Units 06/20/2021 06/17/2021 06/16/2021  Glucose 70 - 99 mg/dL 107(H) 134(H) 142(H)  BUN 8 - 23 mg/dL 22 24(H) 27(H)  Creatinine 0.61 - 1.24 mg/dL 1.33(H) 1.30(H) 1.51(H)  Sodium 135 - 145  mmol/L 136 135 133(L)  Potassium 3.5 - 5.1 mmol/L 3.8 4.3 4.3  Chloride 98 - 111 mmol/L 109 106 105  CO2 22 - 32 mmol/L 23 23 23   Calcium 8.9 - 10.3 mg/dL 8.5(L) 8.6(L) 8.5(L)  Total Protein 6.5 - 8.1 g/dL - - -  Total Bilirubin 0.3 - 1.2 mg/dL - - -  Alkaline Phos 38 - 126 U/L - - -  AST 15 - 41 U/L - - -  ALT 0 - 44 U/L - - -    Imaging: No results found.  Assessment/Plan:   Principal Problem:   Acute CVA (cerebrovascular accident) ([place]) Active Problems:   TIA (transient ischemic attack)   Malnutrition of moderate degree   Patient Summary: [name] [name] is a 75 y.o. with a pertinent PMH of A. 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  Urinary retention History of BPH on Flomax. Doing a trial of void. Patient able to urinate spontaneously yesterday however needed in and out cath this morning with 900 cc out PVR 25 cc.  Plan to closely monitor urine output today and put Foley back in if patient continues to retain. --Repeat bladder scan q4h x2 -- Continue Flomax  0.4 mg day --Place Foley if patient continues to retain to avoid bladder stretch injury  Dementia (suspected Lewy Body dementia) Delirium due to hospitalization, improved Patient's delirium continues to improve.  He was a little frustrated with nurse tech this morning however did not exhibit any aggressive behavior.  Patient not happy with breakfast options and was informed that his medical team will make changes to his diet.  Still pending reevaluation and bed offer. -- Pending discharge to memory care clinic --Continue Seroquel 50 mg qhs and 12.5 mg in AM  --Continue Remeron 7.5 mg qhs and rivastigmine 3 mg twice daily  Constipation Resolved.  Patient had multiple BMs yesterday and overnight with 9 documented stool occurrence in the last 24 hours --Discontinue daily MiraLAX and  make as needed --Continue on daily senna --Continue as needed Dulcolax suppository  Acute lacunar infarct secondary to small vessel disease Stable. PT/OT recommending SNF for acute rehab.   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 Recent echo showed EF 20-25% with biventricular failure. Holding metoprolol in the setting of recent bradycardia with increasing rivastigmine dose. Euvolemic on exam. Heart rate in the 50s to 90s.  Net I&O since admission of -5.8 L. -- Continue to hold metoprolol, Entresto and spironolactone  Anemia: Stable hemoglobin at 9.1 Hyponatremia; resolved Hypokalemia (resolved) Leukocytosis (resolved)   Diet: Regular diet IVF: None,None VTE: Eliquis Code: DNR PT/OT recs: SNF TOC recs: Pending Placement to [facility] rehab   Lacinda Axon, MD 06/21/2021, 1:51 PM IM Resident, PGY-2 Pager: [phone] Internal Medicine [place] 41:10

## 2021-06-22 DIAGNOSIS — G459 Transient cerebral ischemic attack, unspecified: Secondary | ICD-10-CM | POA: Diagnosis not present

## 2021-06-22 DIAGNOSIS — I639 Cerebral infarction, unspecified: Secondary | ICD-10-CM | POA: Diagnosis not present

## 2021-06-22 LAB — GLUCOSE, CAPILLARY
Glucose-Capillary: 92 mg/dL (ref 70–99)
Glucose-Capillary: 152 mg/dL — ABNORMAL HIGH (ref 70–99)
Glucose-Capillary: 151 mg/dL — ABNORMAL HIGH (ref 70–99)
Glucose-Capillary: 151 mg/dL — ABNORMAL HIGH (ref 70–99)

## 2021-06-22 NOTE — Progress Notes (Signed)
Physical Therapy Treatment Patient Details Name: [name] [name] MRN: [id number] DOB: 08/26/1945 Today's Date: 06/22/2021   History of Present Illness [name] is a 75 y/o male admitted 05/25/21 with c/o left leg weakness. Mr. Platten has dementia with short-term memory deficits at baseline. 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 Pt remains admitted pending placement. PMH: A. 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    PT Comments    Pt making gradual progress. Continues to need mod cues for safety and min guard-min A for transfers/gait.  Pt easily distracted and tangential - requiring frequent cues.  Mild deficits in L LE strength compared to R. Pt remains unsafe to be alone from therapy perspective.  Continue to recommend SNF.   Recommendations for follow up therapy are one component of a multi-disciplinary discharge planning process, led by the attending physician.  Recommendations may be updated based on patient status, additional functional criteria and insurance authorization.  Follow Up Recommendations  Skilled nursing-short term rehab (<3 hours/day)     Assistance Recommended at Discharge Frequent or constant Supervision/Assistance  Equipment Recommendations  None recommended by PT    Recommendations for Other Services       Precautions / Restrictions Precautions Precautions: Fall Precaution Comments: confused, sitter     Mobility  Bed Mobility               General bed mobility comments: on BSC at arrival    Transfers Overall transfer level: Needs assistance Equipment used: Rolling walker (2 wheels) Transfers: Sit to/from Stand Sit to Stand: Min guard   Squat pivot transfers: Min assist       General transfer comment: Performed multiple sit to stands during session - min guard but mod cues for safety; squat pivot with min A to steady     Ambulation/Gait Ambulation/Gait assistance: Min assist Gait Distance (Feet): 100 Feet Assistive device: Rolling walker (2 wheels) Gait Pattern/deviations: Step-through pattern;Decreased stride length;Trunk flexed Gait velocity: decreased     General Gait Details: Pt requiring light steadying assist and assist for RW.  Requiring mod cues for RW use at all times and proximity   Stairs             Wheelchair Mobility    Modified Rankin (Stroke Patients Only) Modified Rankin (Stroke Patients Only) Pre-Morbid Rankin Score: Slight disability Modified Rankin: Moderately severe disability     Balance Overall balance assessment: Needs assistance Sitting-balance support: Feet supported;Single extremity supported;No upper extremity supported Sitting balance-Leahy Scale: Fair Sitting balance - Comments: Pt could briefly sit upright without UE support ; however, preferred to L onto L elbow   Standing balance support: Bilateral upper extremity supported Standing balance-Leahy Scale: Poor Standing balance comment: Requiring UE support                            Cognition Arousal/Alertness: Awake/alert Behavior During Therapy: WFL for tasks assessed/performed Overall Cognitive Status: History of cognitive impairments - at baseline                                 General Comments: pt cooperative and pleasant this session but very tangential and needs redirecting/repetition frequently; requiring mod multimodal cues        Exercises Other Exercises Other Exercises: STS x 10 for bil LE  strengthening; heel raises x 10 with min guard and RW; standing marching x 5 with min guard and RW    General Comments General comments (skin integrity, edema, etc.): VSS on RA  MMT: R LE 4+/5 L LE 4/5     Pertinent Vitals/Pain Pain Assessment: Faces Faces Pain Scale: Hurts a little bit Pain Location: R knee Pain Descriptors / Indicators: Sore Pain  Intervention(s): Limited activity within patient's tolerance;Monitored during session    Home Living                          Prior Function            PT Goals (current goals can now be found in the care plan section) Progress towards PT goals: Progressing toward goals    Frequency    Min 3X/week      PT Plan Current plan remains appropriate    Co-evaluation              AM-PAC PT "6 Clicks" Mobility   Outcome Measure  Help needed turning from your back to your side while in a flat bed without using bedrails?: A Little Help needed moving from lying on your back to sitting on the side of a flat bed without using bedrails?: A Little Help needed moving to and from a bed to a chair (including a wheelchair)?: A Lot (2 on all below items b/c mod cues) Help needed standing up from a chair using your arms (e.g., wheelchair or bedside chair)?: A Lot Help needed to walk in hospital room?: A Lot Help needed climbing 3-5 steps with a railing? : A Lot 6 Click Score: 14    End of Session Equipment Utilized During Treatment: Gait belt Activity Tolerance: Patient tolerated treatment well Patient left: in chair;with call bell/phone within reach;with nursing/sitter in room (sitter present, alarm pad under pt if sitter gets called away) Nurse Communication: Mobility status PT Visit Diagnosis: Other abnormalities of gait and mobility (R26.89);Hemiplegia and hemiparesis;Muscle weakness (generalized) (M62.81) Hemiplegia - Right/Left: Left Hemiplegia - dominant/non-dominant: Non-dominant Hemiplegia - caused by: Cerebral infarction     Time: 8280-0349 PT Time Calculation (min) (ACUTE ONLY): 29 min  Charges:  $Gait Training: 8-22 mins $Therapeutic Exercise: 8-22 mins                     [name] [name], PT Acute Rehab Services Pager [phone] [name] [name] [facility] [place] 06/22/2021, 1:27 PM

## 2021-06-22 NOTE — Progress Notes (Signed)
HD#26 Subjective:  Overnight Events: Patient agitated overnight requiring restraints.   On exam this morning he was resting comfortably in a chair out of restraints and had family and a sitter at bedside. He said he was doing alright. Was calm, pleasant, and redirectable.  Objective:  Vital signs in last 24 hours: Vitals:   06/21/21 1640 06/21/21 2013 06/21/21 2359 06/22/21 0354  BP: (!) 128/107 (!) 133/93 105/80 (!) 134/98  Pulse: (!) 54 67 66 (!) 51  Resp: 18 17 17 18   Temp: 98.5 F (36.9 C) 97.6 F (36.4 C) 98.9 F (37.2 C) 97.8 F (36.6 C)  TempSrc:  Oral Oral Oral  SpO2: 100% 100% 100% 96%  Weight:    89.1 kg  Height:       Supplemental O2: Room Air SpO2: 96 %   Physical Exam:  Constitutional: elderly man sitting in chair.  No acute distress.  Cardiovascular: RRR. No m/r/g. No LE edema Pulmonary/Chest: normal work of breathing on room air Abdominal: Soft. NT/ND. Normal bowel sounds. MSK: Normal ROM. Normal bulk and tone Neurological: Alert, answering questions appropriately. Moves all extremities. Skin: Warm and dry, normal skin turgor Psych: Normal affect  Filed Weights   06/16/21 0700 06/20/21 0500 06/22/21 0354  Weight: 91.9 kg 91.6 kg 89.1 kg     Intake/Output Summary (Last 24 hours) at 06/22/2021 0630 Last data filed at 06/21/2021 1600 Gross per 24 hour  Intake --  Output 1625 ml  Net -1625 ml   Net IO Since Admission: -11,853.77 mL [06/22/21 0630]  Pertinent Labs: CBC Latest Ref Rng & Units 06/17/2021 06/16/2021 06/15/2021  WBC 4.0 - 10.5 K/uL 8.8 9.4 10.3  Hemoglobin 13.0 - 17.0 g/dL 9.1(L) 9.1(L) 8.6(L)  Hematocrit 39.0 - 52.0 % 27.1(L) 27.7(L) 26.4(L)  Platelets 150 - 400 K/uL 264 288 292    CMP Latest Ref Rng & Units 06/20/2021 06/17/2021 06/16/2021  Glucose 70 - 99 mg/dL 107(H) 134(H) 142(H)  BUN 8 - 23 mg/dL 22 24(H) 27(H)  Creatinine 0.61 - 1.24 mg/dL 1.33(H) 1.30(H) 1.51(H)  Sodium 135 - 145 mmol/L 136 135 133(L)  Potassium 3.5 - 5.1  mmol/L 3.8 4.3 4.3  Chloride 98 - 111 mmol/L 109 106 105  CO2 22 - 32 mmol/L 23 23 23   Calcium 8.9 - 10.3 mg/dL 8.5(L) 8.6(L) 8.5(L)  Total Protein 6.5 - 8.1 g/dL - - -  Total Bilirubin 0.3 - 1.2 mg/dL - - -  Alkaline Phos 38 - 126 U/L - - -  AST 15 - 41 U/L - - -  ALT 0 - 44 U/L - - -    Imaging: No results found.  Assessment/Plan:   Principal Problem:   Acute CVA (cerebrovascular accident) ([place]) Active Problems:   TIA (transient ischemic attack)   Malnutrition of moderate degree   Patient Summary: [name] [name] is a 75 y.o. with a pertinent PMH of A. 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   Urinary retention History of BPH on Flomax. Doing a trial of void. Patient able to urinate spontaneously overnight though has not voided this morning. Will check a bladder scan --f/u bladder scan -- Continue Flomax 0.4 mg day --Place Foley if patient continues to retain to avoid bladder stretch injury   Dementia (suspected Lewy Body dementia) Delirium due to hospitalization, improved Patient became agitated overnight requiring restraints. Doing better  this morning with sitter at bedside.  Still pending reevaluation and bed offer. -- Pending discharge to memory care clinic --Continue Seroquel 50 mg qhs and 12.5 mg in AM  --Continue Remeron 7.5 mg qhs and rivastigmine 3 mg twice daily   Constipation Resolved. Had >9 bowel movements over the weekend. 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 PT/OT recommending SNF for acute rehab.   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 Recent echo showed EF 20-25%  with biventricular failure. Holding metoprolol in the setting of recent bradycardia with increasing rivastigmine dose. Euvolemic on exam. Heart rate in the 50s to 90s.  Net I&O since admission of -5.8 L. -- Continue to hold metoprolol, Entresto and spironolactone   Anemia: Stable hemoglobin at 9.1 Hyponatremia; resolved Hypokalemia (resolved) Leukocytosis (resolved)   Diet: Regular diet IVF: None,None VTE: Eliquis Code: DNR PT/OT recs: SNF TOC recs: Pending Placement to [facility] rehab    Scarlett Presto, MD Internal Medicine Resident PGY-1 Pager [phone] Please contact the on call pager after 5 pm and on weekends at [phone].

## 2021-06-23 DIAGNOSIS — I639 Cerebral infarction, unspecified: Secondary | ICD-10-CM | POA: Diagnosis not present

## 2021-06-23 DIAGNOSIS — G459 Transient cerebral ischemic attack, unspecified: Secondary | ICD-10-CM | POA: Diagnosis not present

## 2021-06-23 LAB — GLUCOSE, CAPILLARY
Glucose-Capillary: 107 mg/dL — ABNORMAL HIGH (ref 70–99)
Glucose-Capillary: 122 mg/dL — ABNORMAL HIGH (ref 70–99)
Glucose-Capillary: 199 mg/dL — ABNORMAL HIGH (ref 70–99)
Glucose-Capillary: 139 mg/dL — ABNORMAL HIGH (ref 70–99)
Glucose-Capillary: 233 mg/dL — ABNORMAL HIGH (ref 70–99)

## 2021-06-23 MED ORDER — BETHANECHOL CHLORIDE 10 MG PO TABS
10.0000 mg | ORAL_TABLET | Freq: Three times a day (TID) | ORAL | Status: DC
  Administered 2021-06-23 (×2): 10 mg via ORAL
  Filled 2021-06-23 (×2): qty 1

## 2021-06-23 NOTE — Plan of Care (Signed)
  Problem: Health Behavior/Discharge Planning: Goal: Ability to manage health-related needs will improve Outcome: Progressing   Problem: Education: Goal: Knowledge of secondary prevention will improve (SELECT ALL) Outcome: Progressing   Problem: Clinical Measurements: Goal: Diagnostic test results will improve Outcome: Adequate for Discharge   Problem: Nutrition: Goal: Adequate nutrition will be maintained Outcome: Adequate for Discharge

## 2021-06-23 NOTE — TOC Progression Note (Signed)
Transition of Care [facility]) - Progression Note    Patient Details  Name: [name] [name] MRN: [id number] Date of Birth: 11/29/1945  Transition of Care [facility]) CM/SW [place], [place] Phone Number: 06/23/2021, 4:35 PM  Clinical Narrative:   CSW spoke with Admissions at [facility], and they can offer on the patient if he is without the foley. CSW updated MD, and they will attempt void trial. CSW to follow.    Expected Discharge Plan: [place] Barriers to Discharge: Continued Medical Work up, Requiring sitter/restraints  Expected Discharge Plan and Services Expected Discharge Plan: [place] Choice: [place] arrangements for the past 2 months: Single Family Home                                       Social Determinants of Health (SDOH) Interventions    Readmission Risk Interventions No flowsheet data found.

## 2021-06-23 NOTE — Plan of Care (Signed)
Pt has been sitting in the chair ad using the [facility] when needed. Unable to have a BM at the moment. Pt is cooperating and in a good mood. Pt is alert to self and time.  Problem: Health Behavior/Discharge Planning: Goal: Ability to manage health-related needs will improve Outcome: Progressing   Problem: Clinical Measurements: Goal: Diagnostic test results will improve Outcome: Progressing   Problem: Nutrition: Goal: Adequate nutrition will be maintained Outcome: Progressing   Problem: Coping: Goal: Level of anxiety will decrease Outcome: Progressing   Problem: Elimination: Goal: Will not experience complications related to bowel motility Outcome: Progressing Goal: Will not experience complications related to urinary retention Outcome: Progressing   Problem: Safety: Goal: Ability to remain free from injury will improve Outcome: Progressing   Problem: Skin Integrity: Goal: Risk for impaired skin integrity will decrease Outcome: Progressing   Problem: Education: Goal: Knowledge of secondary prevention will improve (SELECT ALL) Outcome: Progressing

## 2021-06-23 NOTE — TOC Progression Note (Signed)
Transition of Care [facility]) - Progression Note    Patient Details  Name: [name] [name] MRN: [id number] Date of Birth: 01-May-1946  Transition of Care [facility]) CM/SW [place], [place] Phone Number: 06/23/2021, 4:33 PM  Clinical Narrative:   CSW spoke with Admissions at [facility], and they are reviewing patient. Admissions met in person with patient for assessment, will discuss with building admin on if they can offer. [facility] has a memory care, so patient will not need to be without sitter for admission. [place] Holter is a barrier for all other SNF offers. CSW to follow.    Expected Discharge Plan: [place] Barriers to Discharge: Continued Medical Work up, Requiring sitter/restraints  Expected Discharge Plan and Services Expected Discharge Plan: [place] Choice: [place] arrangements for the past 2 months: Single Family Home                                       Social Determinants of Health (SDOH) Interventions    Readmission Risk Interventions No flowsheet data found.

## 2021-06-23 NOTE — Progress Notes (Signed)
HD#27 Subjective:  Overnight Events: NAEO   Resting comfortably in bed initially sleeping but woke up to voice. Upon questioning said that he felt "ok" and was still just resting.   Objective:  Vital signs in last 24 hours: Vitals:   06/22/21 1555 06/22/21 1913 06/22/21 2207 06/23/21 0430  BP: 108/82 101/86 103/72 111/71  Pulse: 77 100 86 69  Resp: 19 18 16 18   Temp: 98 F (36.7 C) 98 F (36.7 C) 98.1 F (36.7 C) 98.6 F (37 C)  TempSrc: Oral Oral Oral Oral  SpO2: 100% 100% 100% 99%  Weight:    87.9 kg  Height:       Supplemental O2: Room Air SpO2: 99 %   Physical Exam:  Constitutional: elderly man resting comfortably in bed  Cardiovascular: RRR. No m/r/g. No LE edema Pulmonary/Chest: normal work of breathing on room air Abdominal: Soft. NT/ND. Normal bowel sounds. GU: foley in place, normal colored urine with no appreciable sediment MSK: Normal ROM. Normal bulk and tone Neurological: Alert, answering questions appropriately. Moves all extremities. Skin: Warm and dry, normal skin turgor Psych: Normal affect  Filed Weights   06/20/21 0500 06/22/21 0354 06/23/21 0430  Weight: 91.6 kg 89.1 kg 87.9 kg     Intake/Output Summary (Last 24 hours) at 06/23/2021 0718 Last data filed at 06/23/2021 0504 Gross per 24 hour  Intake 770 ml  Output 1550 ml  Net -780 ml   Net IO Since Admission: -12,633.77 mL [06/23/21 0718]  Pertinent Labs: CBC Latest Ref Rng & Units 06/17/2021 06/16/2021 06/15/2021  WBC 4.0 - 10.5 K/uL 8.8 9.4 10.3  Hemoglobin 13.0 - 17.0 g/dL 9.1(L) 9.1(L) 8.6(L)  Hematocrit 39.0 - 52.0 % 27.1(L) 27.7(L) 26.4(L)  Platelets 150 - 400 K/uL 264 288 292    CMP Latest Ref Rng & Units 06/20/2021 06/17/2021 06/16/2021  Glucose 70 - 99 mg/dL 107(H) 134(H) 142(H)  BUN 8 - 23 mg/dL 22 24(H) 27(H)  Creatinine 0.61 - 1.24 mg/dL 1.33(H) 1.30(H) 1.51(H)  Sodium 135 - 145 mmol/L 136 135 133(L)  Potassium 3.5 - 5.1 mmol/L 3.8 4.3 4.3  Chloride 98 - 111 mmol/L 109  106 105  CO2 22 - 32 mmol/L 23 23 23   Calcium 8.9 - 10.3 mg/dL 8.5(L) 8.6(L) 8.5(L)  Total Protein 6.5 - 8.1 g/dL - - -  Total Bilirubin 0.3 - 1.2 mg/dL - - -  Alkaline Phos 38 - 126 U/L - - -  AST 15 - 41 U/L - - -  ALT 0 - 44 U/L - - -    Imaging: No results found.  Assessment/Plan:   Principal Problem:   Acute CVA (cerebrovascular accident) ([place]) Active Problems:   TIA (transient ischemic attack)   Malnutrition of moderate degree   Patient Summary: [name] [name] is a 75 y.o. with a pertinent PMH of A. 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   Urinary retention Patient had repeated episodes of retention and inability to void with bladder reaching >900cc. Replaced foley 11/7 -- Continue Flomax 0.4 mg day   Dementia (suspected Lewy Body dementia) Delirium due to hospitalization, improved Patient resting comfortably overnight with no sitter at bedside. Easily redirectable. Would do well with a memory care placement, will reach out to social work today to see of any progress has been made on placement. -- Pending discharge to memory care  clinic --Continue Seroquel 50 mg qhs and 12.5 mg in AM  --Continue Remeron 7.5 mg qhs and rivastigmine 3 mg twice daily   Acute lacunar infarct secondary to small vessel disease Stable. PT/OT recommending SNF for acute rehab.   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 Recent echo showed EF 20-25% with biventricular failure. Holding metoprolol in the setting of recent bradycardia with increasing rivastigmine dose. Euvolemic on exam. Heart rate in the 50s to 90s.  Net I&O since admission of -5.8 L. -- Continue to hold metoprolol, Entresto and spironolactone  Constipation; resolved Resolved. Had >9  bowel movements over the weekend. --Discontinue daily MiraLAX and make as needed --Continue on daily senna --Continue as needed Dulcolax suppository   Anemia: Stable hemoglobin at 9.1 Hyponatremia; resolved Hypokalemia (resolved) Leukocytosis (resolved)   Diet: Regular diet IVF: None,None VTE: Eliquis Code: DNR PT/OT recs: SNF TOC recs: Pending Placement to [facility] rehab  Scarlett Presto, MD Internal Medicine Resident PGY-1 Pager [phone] Please contact the on call pager after 5 pm and on weekends at [phone].

## 2021-06-23 NOTE — Progress Notes (Signed)
Nutrition Follow-up  DOCUMENTATION CODES:  Non-severe (moderate) malnutrition in context of chronic illness  INTERVENTION:  -continue Ensure Enlive po TID, each supplement provides 350 kcal and 20 grams of protein -continue MVI with minerals daily -continue feeding assistance with meals.  NUTRITION DIAGNOSIS:  Moderate Malnutrition related to chronic illness (dementia) as evidenced by moderate fat depletion, moderate muscle depletion. ongoing  GOAL:  Patient will meet greater than or equal to 90% of their needs progressing  MONITOR:  PO intake, Supplement acceptance, Labs, Weight trends, I & O's  REASON FOR ASSESSMENT:  Consult Assessment of nutrition requirement/status  ASSESSMENT:  75 yo male with a PMH of  A. 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  Pt more lethargic upon follow-up, but states he is feeling "okay." Pt wanting to rest. Per MD, pt has had repeated episodes of urinary retention and inability to void with bladder reaching >900cc, so foley was replaced yesterday. Otherwise, pt is pending placement in memory care facility. SW/CM TOC following.   Pt's diet was liberalized to regular on 11/06. Per RN, pt continues to have fairly good meal intake in addition to excellent supplement acceptance. Last 8 meal completions charted as 10-95% (~64% avg meal intake). Continue current nutrition plan of care.   UOP: 1547ml documented x24 hours  Medications: Ensure TID, folvite, SSI, remeron, semglee, mvi with minerals, senokot-s Labs: Cr 1.33 (H) CBGs 107-152 x24 hours  Diet Order:   Diet Order             Diet regular Room service appropriate? Yes; Fluid consistency: Thin  Diet effective now                  EDUCATION NEEDS:  Not appropriate for education at this time  Skin:  Skin Assessment: Reviewed RN Assessment  Last BM:  11/06  Height:  Ht Readings from Last 1 Encounters:   06/06/21 6\' 2"  (1.88 m)   Weight:  Wt Readings from Last 1 Encounters:  06/23/21 87.9 kg   BMI:  Body mass index is 24.88 kg/m.  Estimated Nutritional Needs:  Kcal:  2000-2200 Protein:  115-130 grams Fluid:  >2 L   Larkin Ina, MS, RD, LDN (she/her/hers) RD pager number and weekend/on-call pager number located in [place].]

## 2021-06-24 DIAGNOSIS — I639 Cerebral infarction, unspecified: Secondary | ICD-10-CM | POA: Diagnosis not present

## 2021-06-24 DIAGNOSIS — G459 Transient cerebral ischemic attack, unspecified: Secondary | ICD-10-CM | POA: Diagnosis not present

## 2021-06-24 LAB — GLUCOSE, CAPILLARY
Glucose-Capillary: 106 mg/dL — ABNORMAL HIGH (ref 70–99)
Glucose-Capillary: 112 mg/dL — ABNORMAL HIGH (ref 70–99)
Glucose-Capillary: 133 mg/dL — ABNORMAL HIGH (ref 70–99)
Glucose-Capillary: 135 mg/dL — ABNORMAL HIGH (ref 70–99)
Glucose-Capillary: 92 mg/dL (ref 70–99)

## 2021-06-24 MED ORDER — BETHANECHOL CHLORIDE 10 MG PO TABS
20.0000 mg | ORAL_TABLET | Freq: Three times a day (TID) | ORAL | Status: DC
  Administered 2021-06-24 – 2021-06-29 (×14): 20 mg via ORAL
  Filled 2021-06-24 (×16): qty 2

## 2021-06-24 MED ORDER — LORAZEPAM 2 MG/ML IJ SOLN: 0.5000 mg | Freq: Once | INTRAMUSCULAR | Status: AC

## 2021-06-24 MED ORDER — LORAZEPAM 0.5 MG PO TABS
0.5000 mg | ORAL_TABLET | Freq: Once | ORAL | Status: AC
  Administered 2021-06-24: 0.5 mg via ORAL
  Filled 2021-06-24: qty 1
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
## 2021-06-24 NOTE — Progress Notes (Addendum)
HD#28 Subjective:  Overnight Events: NAEO  Resting comfortably with sitter at bedside. Easily redirectable, says that he is feeling alright this morning. Went back to sleep after interview  Objective:  Vital signs in last 24 hours: Vitals:   06/23/21 1916 06/23/21 2315 06/24/21 0300 06/24/21 0329  BP: (!) 127/93 104/66  91/68  Pulse: (!) 51 85  93  Resp: 18 16  16   Temp: 97.7 F (36.5 C) 98.1 F (36.7 C)  98.4 F (36.9 C)  TempSrc: Oral Axillary  Axillary  SpO2: 100% 100%  100%  Weight:   90.5 kg   Height:       Supplemental O2: Room Air SpO2: 100 %   Physical Exam:  Constitutional: sleeping elderly man laying with the covers up to his neck, in no acute distress HENT: mucous membranes moist Eyes: conjunctiva non-erythematous Pulmonary/Chest: normal work of breathing on room air Abdominal: soft, non-tender MSK: normal bulk and tone Neurological: sleeping, wakes up appropriately to voice Skin: warm and dry, normal skin turgor Psych: normal affect  Filed Weights   06/22/21 0354 06/23/21 0430 06/24/21 0300  Weight: 89.1 kg 87.9 kg 90.5 kg     Intake/Output Summary (Last 24 hours) at 06/24/2021 0651 Last data filed at 06/23/2021 1945 Gross per 24 hour  Intake 660 ml  Output --  Net 660 ml   Net IO Since Admission: -11,973.77 mL [06/24/21 0651]  Pertinent Labs: CBC Latest Ref Rng & Units 06/17/2021 06/16/2021 06/15/2021  WBC 4.0 - 10.5 K/uL 8.8 9.4 10.3  Hemoglobin 13.0 - 17.0 g/dL 9.1(L) 9.1(L) 8.6(L)  Hematocrit 39.0 - 52.0 % 27.1(L) 27.7(L) 26.4(L)  Platelets 150 - 400 K/uL 264 288 292    CMP Latest Ref Rng & Units 06/20/2021 06/17/2021 06/16/2021  Glucose 70 - 99 mg/dL 107(H) 134(H) 142(H)  BUN 8 - 23 mg/dL 22 24(H) 27(H)  Creatinine 0.61 - 1.24 mg/dL 1.33(H) 1.30(H) 1.51(H)  Sodium 135 - 145 mmol/L 136 135 133(L)  Potassium 3.5 - 5.1 mmol/L 3.8 4.3 4.3  Chloride 98 - 111 mmol/L 109 106 105  CO2 22 - 32 mmol/L 23 23 23   Calcium 8.9 - 10.3 mg/dL 8.5(L)  8.6(L) 8.5(L)  Total Protein 6.5 - 8.1 g/dL - - -  Total Bilirubin 0.3 - 1.2 mg/dL - - -  Alkaline Phos 38 - 126 U/L - - -  AST 15 - 41 U/L - - -  ALT 0 - 44 U/L - - -    Imaging: No results found.  Assessment/Plan:   Principal Problem:   Acute CVA (cerebrovascular accident) ([place]) Active Problems:   TIA (transient ischemic attack)   Malnutrition of moderate degree   Patient Summary: [name] [name] is a 75 y.o. with a pertinent PMH of A. 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   Urinary retention Foley removed 11/8 and attempting another voiding trial. Added bethanechol yesterday, seems to have some improvement in voiding but not yet at goal. Bladder scan at midnight 0, around 4 am about 200. Will continue to titrate -- increase bethanechol to 20 mg TID -- bladder scan Q4 -- Continue Flomax 0.4 mg day  Addendum: as of 3PM this afternoon patient still unable to void, I/O got over 600cc. Will continue to monitor patient with a few more doses of increased bethanechol   Dementia (suspected Lewy Body dementia) Delirium  due to hospitalization, improved Patient resting comfortably overnight with no sitter at bedside. Easily redirectable. Would do well with a memory care placement, will reach out to social work today to see of any progress has been made on placement. -- Pending discharge to memory care clinic --Continue Seroquel 50 mg qhs and 12.5 mg in AM  --Continue Remeron 7.5 mg qhs and rivastigmine 3 mg twice daily   Acute lacunar infarct secondary to small vessel disease Stable. PT/OT recommending SNF for acute rehab.   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 Recent echo showed EF 20-25% with biventricular failure. Holding metoprolol in  the setting of recent bradycardia with increasing rivastigmine dose. Euvolemic on exam. Heart rate in the 50s to 90s.  Net I&O since admission of -5.8 L. -- Continue to hold metoprolol, Entresto and spironolactone   Constipation; resolved Resolved. Had >9 bowel movements over the weekend. --Discontinue daily MiraLAX and make as needed --Continue on daily senna --Continue as needed Dulcolax suppository   Anemia: Stable hemoglobin at 9.1 Hyponatremia; resolved Hypokalemia (resolved) Leukocytosis (resolved)  Scarlett Presto, MD Internal Medicine Resident PGY-1 Pager [phone] Please contact the on call pager after 5 pm and on weekends at [phone].

## 2021-06-25 ENCOUNTER — Inpatient Hospital Stay (HOSPITAL_COMMUNITY): Payer: No Typology Code available for payment source

## 2021-06-25 DIAGNOSIS — R339 Retention of urine, unspecified: Secondary | ICD-10-CM

## 2021-06-25 DIAGNOSIS — N179 Acute kidney failure, unspecified: Secondary | ICD-10-CM

## 2021-06-25 LAB — GLUCOSE, CAPILLARY
Glucose-Capillary: 83 mg/dL (ref 70–99)
Glucose-Capillary: 95 mg/dL (ref 70–99)
Glucose-Capillary: 141 mg/dL — ABNORMAL HIGH (ref 70–99)
Glucose-Capillary: 234 mg/dL — ABNORMAL HIGH (ref 70–99)
Glucose-Capillary: 113 mg/dL — ABNORMAL HIGH (ref 70–99)

## 2021-06-25 MED ORDER — STERILE WATER FOR INJECTION IJ SOLN
INTRAMUSCULAR | Status: AC
  Administered 2021-06-25: 2.1 mL
  Filled 2021-06-25: qty 10

## 2021-06-25 MED ORDER — TAMSULOSIN HCL 0.4 MG PO CAPS
0.8000 mg | ORAL_CAPSULE | Freq: Every day | ORAL | Status: DC
  Administered 2021-06-25 – 2021-07-03 (×9): 0.8 mg via ORAL
  Filled 2021-06-25 (×9): qty 2

## 2021-06-25 MED ORDER — OLANZAPINE 10 MG IM SOLR
5.0000 mg | Freq: Once | INTRAMUSCULAR | Status: AC | PRN
  Administered 2021-06-25: 5 mg via INTRAMUSCULAR
  Filled 2021-06-25: qty 10

## 2021-06-25 NOTE — Progress Notes (Addendum)
Notified provider of patients agitation and refusing to cooperate with care, including having IN/OUT cath, lying in bed, taking mediation, and eye drops, orders received.

## 2021-06-25 NOTE — TOC Progression Note (Addendum)
Transition of Care [facility]) - Progression Note    Patient Details  Name: Jacobey Gura MRN: [id number] Date of Birth: 07-20-1946 to Transition of Care [facility]) CM/SW Contact  Joanne Chars, [place] Phone Number: 06/25/2021, 10:50 AM  Clinical Narrative:   CSW spoke with Ebony Hail at [facility], they are still able to admit once voiding issues are resolved.  Pt has Medicare part A, no auth.  Ebony Hail asked if family had applied for medicaid as pt likely to need LTC.  CSW spoke with pt wife Leda Gauze, confirmed plan for [employer].  She has not pursued medicaid application.  Discussed financial counseling can assist and wife agreeable to this.  Email sent to [place] with financial counseling to screen and assist if eligible.   1230: CSW received call from pt daughter [name], [phone].  Family not aware that pt was going to [facility], does not want this facility due to distance, asking for other options.  CSW reviewed epic and pt has several other bed offers, which were discussed.  [facility].  CSW attempted to reach [place], [place].    Expected Discharge Plan: [place] Barriers to Discharge: Continued Medical Work up, Requiring sitter/restraints  Expected Discharge Plan and Services Expected Discharge Plan: [place] Choice: [place] arrangements for the past 2 months: Single Family Home                                       Social Determinants of Health (SDOH) Interventions    Readmission Risk Interventions No flowsheet data found.

## 2021-06-25 NOTE — Progress Notes (Signed)
HD#29 Subjective:  Overnight Events: NAEO   Patient resting comfortably in bed with no concerns. Denies any pain in stomach, had two normal sized bowel movements overnight. Says he is still feeling sleepy.  Objective:  Vital signs in last 24 hours: Vitals:   06/24/21 1210 06/24/21 1627 06/24/21 2000 06/25/21 0030  BP: 107/78 109/77 113/76 102/78  Pulse: 70 82 87 80  Resp: 20 18 19 20   Temp: 98.3 F (36.8 C) 98 F (36.7 C) 97.7 F (36.5 C) (!) 97.3 F (36.3 C)  TempSrc: Oral Oral Axillary Axillary  SpO2: 100% 100% 97% 99%  Weight:    91.3 kg  Height:       Supplemental O2: Room Air SpO2: 99 %   Physical Exam:  Constitutional: elderly man resting comfortably in bed in no acute distress  HENT: mucous membranes moist Eyes: conjunctiva non-erythematous Pulmonary/Chest: normal work of breathing on room air Abdominal: soft, non-tender, no distension MSK: normal bulk and tone Neurological: alert, answers questions appropriately Skin: warm and dry, normal skin turgor Psych: normal affect  Filed Weights   06/23/21 0430 06/24/21 0300 06/25/21 0030  Weight: 87.9 kg 90.5 kg 91.3 kg     Intake/Output Summary (Last 24 hours) at 06/25/2021 0606 Last data filed at 06/24/2021 1626 Gross per 24 hour  Intake 560 ml  Output 675 ml  Net -115 ml   Net IO Since Admission: -12,088.77 mL [06/25/21 0606]  Pertinent Labs: CBC Latest Ref Rng & Units 06/17/2021 06/16/2021 06/15/2021  WBC 4.0 - 10.5 K/uL 8.8 9.4 10.3  Hemoglobin 13.0 - 17.0 g/dL 9.1(L) 9.1(L) 8.6(L)  Hematocrit 39.0 - 52.0 % 27.1(L) 27.7(L) 26.4(L)  Platelets 150 - 400 K/uL 264 288 292    CMP Latest Ref Rng & Units 06/20/2021 06/17/2021 06/16/2021  Glucose 70 - 99 mg/dL 107(H) 134(H) 142(H)  BUN 8 - 23 mg/dL 22 24(H) 27(H)  Creatinine 0.61 - 1.24 mg/dL 1.33(H) 1.30(H) 1.51(H)  Sodium 135 - 145 mmol/L 136 135 133(L)  Potassium 3.5 - 5.1 mmol/L 3.8 4.3 4.3  Chloride 98 - 111 mmol/L 109 106 105  CO2 22 - 32 mmol/L  23 23 23   Calcium 8.9 - 10.3 mg/dL 8.5(L) 8.6(L) 8.5(L)  Total Protein 6.5 - 8.1 g/dL - - -  Total Bilirubin 0.3 - 1.2 mg/dL - - -  Alkaline Phos 38 - 126 U/L - - -  AST 15 - 41 U/L - - -  ALT 0 - 44 U/L - - -    Imaging: No results found.  Assessment/Plan:   Principal Problem:   Acute CVA (cerebrovascular accident) ([place]) Active Problems:   TIA (transient ischemic attack)   Malnutrition of moderate degree   Patient Summary: [name] [name] is a 75 y.o. with a pertinent PMH of A. 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   Urinary retention Foley removed 11/8 and attempting another voiding trial. Added bethanechol yesterday, seems to have some improvement in voiding but not yet at goal. Bladder scan at midnight 0, around 4 am about 200. Will continue to titrate -- continue bethanechol to 20 mg TID -- bladder scan Q4 -- increase Flomax to 0.8 mg day -- follow up orthostatic vitals   Dementia (suspected Lewy Body dementia) Delirium due to hospitalization, improved Patient resting comfortably overnight with no sitter at bedside. Easily redirectable. Would do well with  a memory care placement, will reach out to social work today to see of any progress has been made on placement. -- Pending discharge to memory care clinic --Continue Seroquel 50 mg qhs and 12.5 mg in AM  --Continue Remeron 7.5 mg qhs and rivastigmine 3 mg twice daily   Acute lacunar infarct secondary to small vessel disease Stable. PT/OT recommending SNF for acute rehab.   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 Recent echo showed EF 20-25% with biventricular failure. Holding metoprolol in the setting of recent bradycardia with increasing rivastigmine dose. Euvolemic on exam.  Heart rate in the 50s to 90s.  Net I&O since admission of -5.8 L. -- Continue to hold metoprolol, Entresto and spironolactone   Constipation; resolved Resolved. Had >9 bowel movements over the weekend. --Discontinue daily MiraLAX and make as needed --Continue on daily senna --Continue as needed Dulcolax suppository   Anemia: Stable hemoglobin at 9.1 Hyponatremia; resolved Hypokalemia (resolved) Leukocytosis (resolved)   Scarlett Presto, MD Internal Medicine Resident PGY-1 Pager [phone] Please contact the on call pager after 5 pm and on weekends at [phone].

## 2021-06-26 LAB — GLUCOSE, CAPILLARY
Glucose-Capillary: 112 mg/dL — ABNORMAL HIGH (ref 70–99)
Glucose-Capillary: 167 mg/dL — ABNORMAL HIGH (ref 70–99)
Glucose-Capillary: 179 mg/dL — ABNORMAL HIGH (ref 70–99)
Glucose-Capillary: 60 mg/dL — ABNORMAL LOW (ref 70–99)
Glucose-Capillary: 100 mg/dL — ABNORMAL HIGH (ref 70–99)

## 2021-06-26 MED ORDER — LORAZEPAM 2 MG/ML IJ SOLN
0.2500 mg | Freq: Once | INTRAMUSCULAR | Status: AC
  Administered 2021-06-26: 0.25 mg via INTRAMUSCULAR
  Filled 2021-06-26: qty 1

## 2021-06-26 MED ORDER — CHLORHEXIDINE GLUCONATE CLOTH 2 % EX PADS
6.0000 | MEDICATED_PAD | Freq: Every day | CUTANEOUS | Status: DC
  Administered 2021-06-26 – 2021-06-29 (×3): 6 via TOPICAL

## 2021-06-26 MED ORDER — QUETIAPINE FUMARATE 25 MG PO TABS
25.0000 mg | ORAL_TABLET | Freq: Every day | ORAL | Status: DC
  Administered 2021-06-26 – 2021-06-28 (×3): 25 mg via ORAL
  Filled 2021-06-26 (×3): qty 1

## 2021-06-26 NOTE — Progress Notes (Signed)
HD#30 Subjective:  Overnight Events: NAEO   Says that he has seen some better days and some worse days. Says that it almost time for Thanksgiving.  Objective:  Vital signs in last 24 hours: Vitals:   06/25/21 1924 06/25/21 2325 06/26/21 0258 06/26/21 0347  BP: 110/78 101/66 (!) 116/95 (!) 118/99  Pulse: 77 (!) 56 93 (!) 58  Resp: 16 18 16 18   Temp:  98.6 F (37 C) 97.9 F (36.6 C) 97.7 F (36.5 C)  TempSrc:  Oral Oral Oral  SpO2: 100% 100% 100% 100%  Weight:      Height:       Supplemental O2: Room Air SpO2: 100 %   Physical Exam:  Constitutional: elderly man resting comfortably in bed with mittens in no acute distress  HENT: mucous membranes moist Eyes: conjunctiva non-erythematous Pulmonary/Chest: normal work of breathing on room air Abdominal: soft, non-tender, no distension GU: foley in place MSK: normal bulk and tone Neurological: alert, answers questions appropriately Skin: warm and dry, normal skin turgor Psych: normal affect  Filed Weights   06/23/21 0430 06/24/21 0300 06/25/21 0030  Weight: 87.9 kg 90.5 kg 91.3 kg     Intake/Output Summary (Last 24 hours) at 06/26/2021 0709 Last data filed at 06/26/2021 0600 Gross per 24 hour  Intake 800 ml  Output 800 ml  Net 0 ml   Net IO Since Admission: -12,688.77 mL [06/26/21 0709]  Pertinent Labs: CBC Latest Ref Rng & Units 06/17/2021 06/16/2021 06/15/2021  WBC 4.0 - 10.5 K/uL 8.8 9.4 10.3  Hemoglobin 13.0 - 17.0 g/dL 9.1(L) 9.1(L) 8.6(L)  Hematocrit 39.0 - 52.0 % 27.1(L) 27.7(L) 26.4(L)  Platelets 150 - 400 K/uL 264 288 292    CMP Latest Ref Rng & Units 06/20/2021 06/17/2021 06/16/2021  Glucose 70 - 99 mg/dL 107(H) 134(H) 142(H)  BUN 8 - 23 mg/dL 22 24(H) 27(H)  Creatinine 0.61 - 1.24 mg/dL 1.33(H) 1.30(H) 1.51(H)  Sodium 135 - 145 mmol/L 136 135 133(L)  Potassium 3.5 - 5.1 mmol/L 3.8 4.3 4.3  Chloride 98 - 111 mmol/L 109 106 105  CO2 22 - 32 mmol/L 23 23 23   Calcium 8.9 - 10.3 mg/dL 8.5(L) 8.6(L)  8.5(L)  Total Protein 6.5 - 8.1 g/dL - - -  Total Bilirubin 0.3 - 1.2 mg/dL - - -  Alkaline Phos 38 - 126 U/L - - -  AST 15 - 41 U/L - - -  ALT 0 - 44 U/L - - -    Imaging: US PELVIS LIMITED (TRANSABDOMINAL ONLY)  Result Date: 06/25/2021 CLINICAL DATA:  Urinary retention. EXAM: LIMITED ULTRASOUND OF PELVIS TECHNIQUE: Limited transabdominal ultrasound examination of the bladder and prostate gland was performed. COMPARISON:  06/01/2021. FINDINGS: Minimal debris is noted in the dependent portion of the urinary bladder. Prevoid bladder volume is 363.68 mL. Postvoid bladder volume could not be obtained. The prostate gland measures 6.2 x 4.6 x 5.4 cm, total volume 80.71 mL. IMPRESSION: 1. Minimal residual debris in the dependent portion of the urinary bladder. 2. Enlarged prostate gland. Electronically Signed   By: Brett Fairy M.D.   On: 06/25/2021 20:52    Assessment/Plan:   Principal Problem:   Acute CVA (cerebrovascular accident) [facility]) Active Problems:   TIA (transient ischemic attack)   Malnutrition of moderate degree   Patient Summary: [name] [name] is a 75 y.o. with a pertinent PMH of A. 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   Urinary retention Flomax increased to 0.8 on 11/10 but patient still unable to void. Continued retaining overnight and became agitated requiring staff to place a foley. Patient did tolerate increased dose of flomax. -- continue bethanechol to 20 mg TID -- continue Flomax to 0.8 mg day   Dementia (suspected Lewy Body dementia) Delirium due to hospitalization, improved  Would do well with a memory care placement but has difficulty with urination requiring a foley (see above). Will continue to work on this. Suspect his intermittent agitation coincides with urinary retention. Required an IM dose of zyprexa overnight  11/10 -- Pending discharge to memory care clinic vs home --Continue Seroquel 50 mg qhs and 12.5 mg in AM  --Continue Remeron 7.5 mg qhs and rivastigmine 3 mg twice daily   Acute lacunar infarct secondary to small vessel disease Stable. PT/OT recommending SNF for acute rehab.   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 Recent echo showed EF 20-25% with biventricular failure. Holding metoprolol in the setting of recent bradycardia with increasing rivastigmine dose. Euvolemic on exam. Heart rate in the 50s to 90s.   -- Continue to hold metoprolol, Entresto and spironolactone   Constipation; resolved Resolved.  --Discontinue daily MiraLAX and make as needed --Continue on daily senna --Continue as needed Dulcolax suppository   Anemia:  Stable  Hyponatremia; resolved Hypokalemia (resolved) Leukocytosis (resolved)  Scarlett Presto, MD Internal Medicine Resident PGY-1 Pager [phone] Please contact the on call pager after 5 pm and on weekends at [phone].

## 2021-06-26 NOTE — Progress Notes (Signed)
Physical Therapy Treatment Patient Details Name: [name] [name] MRN: [id number] DOB: [birth year] Today's Date: 06/26/2021   History of Present Illness [name] is a 75 y/o male admitted 05/25/21 with c/o left leg weakness. [name] has dementia with short-term memory deficits at baseline. 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 Pt remains admitted pending placement. PMH: A. 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    PT Comments    Pt received in chair, agreeable to therapy session with encouragement. Pt continues to demonstrate high fall risk due to cognitive deficit, slow gait speed (<0.4 m/s), poor management of assistive device (picking it up off ground and holding it too far advanced), and decreased activity tolerance. Pt cooperative and performed stair navigation training and household distance gait tasks x2 with seated break between, with increased WOB with fatigue and needing up to minA. Pt continues to benefit from PT services to progress toward functional mobility goals.    Recommendations for follow up therapy are one component of a multi-disciplinary discharge planning process, led by the attending physician.  Recommendations may be updated based on patient status, additional functional criteria and insurance authorization.  Follow Up Recommendations  Skilled nursing-short term rehab (<3 hours/day) (memory care facility is ideal)     Assistance Recommended at Discharge Frequent or constant Supervision/Assistance  Equipment Recommendations  None recommended by PT    Recommendations for Other Services       Precautions / Restrictions Precautions Precautions: Fall Precaution Comments: dementia, sitter Restrictions Weight Bearing Restrictions: No     Mobility  Bed Mobility               General bed mobility comments: pt requesting to return to chair.     Transfers Overall transfer level: Needs assistance Equipment used: None Transfers: Sit to/from Stand Sit to Stand: Min guard;Min assist           General transfer comment: fair recall of UE placement but pt attempting to sit prior to reaching proximity to chair needs cues to match LE to chair prior to sitting each time (x2 trials) and minA for stand>sit safely.    Ambulation/Gait Ambulation/Gait assistance: Min assist Gait Distance (Feet): 70 Feet (x2 with seated break ~3 mins between trials) Assistive device: Rolling walker (2 wheels) Gait Pattern/deviations: Step-through pattern;Decreased stride length;Trunk flexed Gait velocity: decreased     General Gait Details: Pt requiring light steadying assist and assist for RW use.  Requiring mod cues for RW use at all times and proximity and tends to hold it at arms' length and flex trunk forward when not cued. slow pace; ~3 standing breaks each trial, pt picking up RW totally from ground at one point needing minA to steady and manual assist to place RW back on floor.   Stairs Stairs: Yes Stairs assistance: Min assist Stair Management: With walker;Two rails;Step to pattern;Forwards Number of Stairs: 2 General stair comments: cues for safety/sequencing, step-to pattern and needing BUE support from rail. Pt with increased WOB due to exertion and standing break after completion (7" steps x2 in PT gym)       Modified Rankin (Stroke Patients Only) Modified Rankin (Stroke Patients Only) Pre-Morbid Rankin Score: Slight disability Modified Rankin: Moderately severe disability     Balance Overall balance assessment: Needs assistance Sitting-balance support: No upper extremity supported;Feet supported Sitting balance-Leahy Scale: Good     Standing balance support: No upper  extremity supported;Single extremity supported;During functional activity Standing balance-Leahy Scale: Fair Standing balance comment: able to take pivotal  steps at bedside with minA and U UE support, min guard for static standing without UE support, mostly needing BUE support of RW             Cognition Arousal/Alertness: Awake/alert Behavior During Therapy: Restless;Anxious Overall Cognitive Status: History of cognitive impairments - at baseline    General Comments: Pt perseverative on calling his brother [name] [name] and going to toilet; Pt able to be redirected with heavy cues and change of subject. Pt more alert/restless today and eager to mobilize but needs frequent standing/seated breaks due to fatigue. Pt did not appear to recognize spouse [name] [name]'s name when we suggested he may call her.        Exercises Other Exercises Other Exercises: pt ignoring cues for exercises due to internal distraction so defer.    General Comments General comments (skin integrity, edema, etc.): VSS per chart review, increased RR to 30's with exertion and pt needed multiple seated/standing breaks and RR decreased with breaks      Pertinent Vitals/Pain Pain Assessment: Faces Faces Pain Scale: Hurts a little bit Pain Location: R foot? pt states "my bunions" Pain Descriptors / Indicators: Discomfort Pain Intervention(s): Limited activity within patient's tolerance;Monitored during session;Repositioned     PT Goals (current goals can now be found in the care plan section) Acute Rehab PT Goals Patient Stated Goal: to go outside PT Goal Formulation: Patient unable to participate in goal setting Time For Goal Achievement: 07/06/21 Progress towards PT goals: Progressing toward goals    Frequency    Min 3X/week      PT Plan Current plan remains appropriate       AM-PAC PT "6 Clicks" Mobility   Outcome Measure  Help needed turning from your back to your side while in a flat bed without using bedrails?: A Little Help needed moving from lying on your back to sitting on the side of a flat bed without using bedrails?: A Little Help needed moving to and  from a bed to a chair (including a wheelchair)?: A Lot ("a lot" on all items below due to mod cues) Help needed standing up from a chair using your arms (e.g., wheelchair or bedside chair)?: A Lot Help needed to walk in hospital room?: A Lot Help needed climbing 3-5 steps with a railing? : A Lot 6 Click Score: 14    End of Session Equipment Utilized During Treatment: Gait belt Activity Tolerance: Patient tolerated treatment well Patient left: in chair;with call bell/phone within reach;with nursing/sitter in room (sitter present so did not activate chair alarm) Nurse Communication: Mobility status PT Visit Diagnosis: Other abnormalities of gait and mobility (R26.89);Hemiplegia and hemiparesis;Muscle weakness (generalized) (M62.81) Hemiplegia - Right/Left: Left Hemiplegia - dominant/non-dominant: Non-dominant Hemiplegia - caused by: Cerebral infarction     Time: 4103-0131 PT Time Calculation (min) (ACUTE ONLY): 17 min  Charges:  $Gait Training: 8-22 mins                     Crystalle Popwell P., PTA Acute Rehabilitation Services Pager: [phone] Office: [place] 06/26/2021, 3:32 PM

## 2021-06-26 NOTE — Progress Notes (Signed)
Notified provider of bladder scan 564 ml, orders received for indwelling foley for retention. Patient is refusing to have foley placed.  He is oriented to self only at this time.  He does not seem to be in distress.  Provider made aware.

## 2021-06-26 NOTE — TOC Progression Note (Addendum)
Transition of Care [facility]) - Progression Note    Patient Details  Name: [name] [name] MRN: [id number] Date of Birth: 06-19-1946  [facility]) CM/SW Contact  Joanne Chars, LCSW Phone Number: 06/26/2021, 10:03 AM  Clinical Narrative:    TC from [facility].  Bed offer on this pt was from referral a month ago, asked for updated info.  Referral resent.    1100: [facility] at [facility] cannot accept right now but will reevaluate Monday.  1420: referral resent to [facility], [place] with followup calls to confirm if they can still offer beds.  1435: [facility] still offers bed.    1540: CSW spoke with daughter [name] and updated her on the above.  [facility].      Expected Discharge Plan: [place] Barriers to Discharge: Continued Medical Work up, Requiring sitter/restraints  Expected Discharge Plan and Services Expected Discharge Plan: [place] Choice: [place] arrangements for the past 2 months: Single Family Home                                       Social Determinants of Health (SDOH) Interventions    Readmission Risk Interventions No flowsheet data found.

## 2021-06-26 NOTE — Progress Notes (Signed)
Foley placed per policy.  Patient tolerated well.

## 2021-06-27 DIAGNOSIS — I639 Cerebral infarction, unspecified: Secondary | ICD-10-CM | POA: Diagnosis not present

## 2021-06-27 LAB — GLUCOSE, CAPILLARY
Glucose-Capillary: 112 mg/dL — ABNORMAL HIGH (ref 70–99)
Glucose-Capillary: 198 mg/dL — ABNORMAL HIGH (ref 70–99)
Glucose-Capillary: 168 mg/dL — ABNORMAL HIGH (ref 70–99)
Glucose-Capillary: 131 mg/dL — ABNORMAL HIGH (ref 70–99)

## 2021-06-27 NOTE — Progress Notes (Signed)
HD#31 Subjective:  Overnight Events: NAEO  Making jokes at the bedside, feeling ok today.  Objective:  Vital signs in last 24 hours: Vitals:   06/26/21 1553 06/26/21 2045 06/27/21 0010 06/27/21 0434  BP: 107/80 115/84 127/73 113/69  Pulse: 75 86 63 66  Resp: 19 18 16 18   Temp: 98.1 F (36.7 C) 97.8 F (36.6 C) (!) 97.3 F (36.3 C) 98.2 F (36.8 C)  TempSrc: Oral Oral Oral Axillary  SpO2: 100% 100% 100% 100%  Weight:    89.9 kg  Height:       Supplemental O2: Room Air SpO2: 100 %   Physical Exam:  Constitutional: elderly man sitting on the edge of the bed in no acute distress  HENT: mucous membranes moist Eyes: conjunctiva non-erythematous Pulmonary/Chest: normal work of breathing on room air Abdominal: soft, non-tender, no distension MSK: normal bulk and tone Neurological: alert, answers questions appropriately Skin: warm and dry, normal skin turgor Psych: normal affect      Filed Weights   06/24/21 0300 06/25/21 0030 06/27/21 0434  Weight: 90.5 kg 91.3 kg 89.9 kg     Intake/Output Summary (Last 24 hours) at 06/27/2021 0657 Last data filed at 06/27/2021 0437 Gross per 24 hour  Intake 980 ml  Output 1350 ml  Net -370 ml   Net IO Since Admission: -13,058.77 mL [06/27/21 0657]  Pertinent Labs: CBC Latest Ref Rng & Units 06/17/2021 06/16/2021 06/15/2021  WBC 4.0 - 10.5 K/uL 8.8 9.4 10.3  Hemoglobin 13.0 - 17.0 g/dL 9.1(L) 9.1(L) 8.6(L)  Hematocrit 39.0 - 52.0 % 27.1(L) 27.7(L) 26.4(L)  Platelets 150 - 400 K/uL 264 288 292    CMP Latest Ref Rng & Units 06/20/2021 06/17/2021 06/16/2021  Glucose 70 - 99 mg/dL 107(H) 134(H) 142(H)  BUN 8 - 23 mg/dL 22 24(H) 27(H)  Creatinine 0.61 - 1.24 mg/dL 1.33(H) 1.30(H) 1.51(H)  Sodium 135 - 145 mmol/L 136 135 133(L)  Potassium 3.5 - 5.1 mmol/L 3.8 4.3 4.3  Chloride 98 - 111 mmol/L 109 106 105  CO2 22 - 32 mmol/L 23 23 23   Calcium 8.9 - 10.3 mg/dL 8.5(L) 8.6(L) 8.5(L)  Total Protein 6.5 - 8.1 g/dL - - -  Total  Bilirubin 0.3 - 1.2 mg/dL - - -  Alkaline Phos 38 - 126 U/L - - -  AST 15 - 41 U/L - - -  ALT 0 - 44 U/L - - -    Imaging: No results found.  Assessment/Plan:   Principal Problem:   Acute CVA (cerebrovascular accident) [facility]) Active Problems:   TIA (transient ischemic attack)   Malnutrition of moderate degree   Patient Summary: [name] [name] is a 75 y.o. PMH of A. 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   Urinary retention Have decreased the night time seroquel to try and decrease the anticholinergic effects and hopefull allow him to void. Removed foley this morning 11/12 -- voiding trial today 11/12; bladder scan Q4 -- continue bethanechol to 20 mg TID -- continue Flomax to 0.8 mg day   Dementia (suspected Lewy Body dementia) Delirium due to hospitalization, improved  Would do well with a memory care placement but has difficulty with urination requiring a foley (see above). Will continue to work on this. Suspect his intermittent agitation coincides with urinary retention.  -- Pending discharge to memory care clinic vs home --decrease  Seroquel 25 mg qhs; continue 12.5 mg in AM  --Continue Remeron 7.5 mg qhs and rivastigmine 3 mg twice daily   Acute lacunar infarct secondary to small vessel disease Stable. PT/OT recommending SNF for acute rehab.   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 Recent echo showed EF 20-25% with biventricular failure. Holding metoprolol in the setting of recent bradycardia with increasing rivastigmine dose. Euvolemic on exam. Heart rate in the 50s to 90s.   -- Continue to hold metoprolol, Entresto and spironolactone   Constipation; resolved Resolved.  --Discontinue daily MiraLAX and make as needed --Continue on daily senna --Continue as needed Dulcolax suppository    Anemia:  Stable   Hyponatremia; resolved Hypokalemia (resolved) Leukocytosis (resolved)   Scarlett Presto, MD Internal Medicine Resident PGY-1 Pager [phone] Please contact the on call pager after 5 pm and on weekends at [phone].

## 2021-06-27 NOTE — Progress Notes (Addendum)
Provider notified for CBG 60. Patient able to drink ensure.  Recheck CBG 100.  Orders received to hold tonights [facility].

## 2021-06-28 DIAGNOSIS — I255 Ischemic cardiomyopathy: Secondary | ICD-10-CM

## 2021-06-28 DIAGNOSIS — I5020 Unspecified systolic (congestive) heart failure: Secondary | ICD-10-CM

## 2021-06-28 DIAGNOSIS — F0390 Unspecified dementia without behavioral disturbance: Secondary | ICD-10-CM

## 2021-06-28 LAB — CBC
HCT: 28.8 % — ABNORMAL LOW (ref 39.0–52.0)
Hemoglobin: 9.6 g/dL — ABNORMAL LOW (ref 13.0–17.0)
MCH: 33.8 pg (ref 26.0–34.0)
MCHC: 33.3 g/dL (ref 30.0–36.0)
MCV: 101.4 fL — ABNORMAL HIGH (ref 80.0–100.0)
Platelets: 134 10*3/uL — ABNORMAL LOW (ref 150–400)
RBC: 2.84 MIL/uL — ABNORMAL LOW (ref 4.22–5.81)
RDW: 14.6 % (ref 11.5–15.5)
WBC: 4.9 10*3/uL (ref 4.0–10.5)
nRBC: 0 % (ref 0.0–0.2)

## 2021-06-28 LAB — BASIC METABOLIC PANEL
Anion gap: 6 (ref 5–15)
BUN: 24 mg/dL — ABNORMAL HIGH (ref 8–23)
CO2: 24 mmol/L (ref 22–32)
Calcium: 9 mg/dL (ref 8.9–10.3)
Chloride: 108 mmol/L (ref 98–111)
Creatinine, Ser: 1.45 mg/dL — ABNORMAL HIGH (ref 0.61–1.24)
GFR, Estimated: 50 mL/min — ABNORMAL LOW (ref >60)
Glucose, Bld: 125 mg/dL — ABNORMAL HIGH (ref 70–99)
Potassium: 3.8 mmol/L (ref 3.5–5.1)
Sodium: 138 mmol/L (ref 135–145)

## 2021-06-28 LAB — GLUCOSE, CAPILLARY
Glucose-Capillary: 126 mg/dL — ABNORMAL HIGH (ref 70–99)
Glucose-Capillary: 126 mg/dL — ABNORMAL HIGH (ref 70–99)
Glucose-Capillary: 87 mg/dL (ref 70–99)
Glucose-Capillary: 125 mg/dL — ABNORMAL HIGH (ref 70–99)

## 2021-06-28 NOTE — Progress Notes (Signed)
HD#32 Subjective:  Overnight Events:   Patient was evaluated at the bedside laying comfortably in bed.  Reported that he just use the restroom.  He endorsed some mild discomfort in his tailbone.  No other symptoms.   Objective:  Vital signs in last 24 hours: Vitals:   06/28/21 0500 06/28/21 0506 06/28/21 1030 06/28/21 1452  BP:  (!) 113/96 (!) 112/94 91/77  Pulse:  (!) 57 98 (!) 54  Resp:  18 16 18   Temp:  98 F (36.7 C) 98.2 F (36.8 C) 97.6 F (36.4 C)  TempSrc:  Oral Oral Oral  SpO2:  100% 99% 100%  Weight: 90.5 kg     Height:       Supplemental O2: Room Air SpO2: 100 %   Physical Exam:  Constitutional: Well-appearing elderly male laying in bed.  NAD. CV: RRR.  No murmurs rubs or gallops. Pulmonary/Chest: Lungs CTAB Abdominal: Soft.  NT/ND.  Normal BS Neurological: Alert and oriented.  No focal deficits.  Skin: warm and dry Psych: Normal mood and affect    Filed Weights   06/25/21 0030 06/27/21 0434 06/28/21 0500  Weight: 91.3 kg 89.9 kg 90.5 kg     Intake/Output Summary (Last 24 hours) at 06/28/2021 1458 Last data filed at 06/28/2021 1141 Gross per 24 hour  Intake 0 ml  Output 1325 ml  Net -1325 ml   Net IO Since Admission: -13,693.77 mL [06/28/21 1458]  Pertinent Labs: CBC Latest Ref Rng & Units 06/28/2021 06/17/2021 06/16/2021  WBC 4.0 - 10.5 K/uL 4.9 8.8 9.4  Hemoglobin 13.0 - 17.0 g/dL 9.6(L) 9.1(L) 9.1(L)  Hematocrit 39.0 - 52.0 % 28.8(L) 27.1(L) 27.7(L)  Platelets 150 - 400 K/uL 134(L) 264 288    CMP Latest Ref Rng & Units 06/28/2021 06/20/2021 06/17/2021  Glucose 70 - 99 mg/dL 125(H) 107(H) 134(H)  BUN 8 - 23 mg/dL 24(H) 22 24(H)  Creatinine 0.61 - 1.24 mg/dL 1.45(H) 1.33(H) 1.30(H)  Sodium 135 - 145 mmol/L 138 136 135  Potassium 3.5 - 5.1 mmol/L 3.8 3.8 4.3  Chloride 98 - 111 mmol/L 108 109 106  CO2 22 - 32 mmol/L 24 23 23   Calcium 8.9 - 10.3 mg/dL 9.0 8.5(L) 8.6(L)  Total Protein 6.5 - 8.1 g/dL - - -  Total Bilirubin 0.3 - 1.2 mg/dL -  - -  Alkaline Phos 38 - 126 U/L - - -  AST 15 - 41 U/L - - -  ALT 0 - 44 U/L - - -    Imaging: No results found.  Assessment/Plan:   Principal Problem:   Acute CVA (cerebrovascular accident) [facility]) Active Problems:   TIA (transient ischemic attack)   Malnutrition of moderate degree   Patient Summary: Armonte Tortorella is a 75 y.o. PMH of A. 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   Urinary retention Foley removed on 11/12. Patient reports he was able to urinate this morning.  Per RN, patient occasionally urinating spontaneously.  Bladder scans has been for 434 cc and 57 cc today.  We will continue voiding trials with bladder scans. --Continue voiding trials with every 4 bladder scans --Continue on bethanechol 20 mg 3 times daily --Continue Flomax 0.8 mg daily   Dementia (suspected Lewy Body dementia) Delirium due to hospitalization, improved Improving.  Patient has personal concerns about getting tired of being in the hospital for so long.  He has been redirectable by medical team. No agitation overnight. --Pending discharge to memory care clinic versus home with home health --Continue Seroquel 25 mg at night and 12.5 mg in the morning -- Continue Remeron 7.5 mg at bedtime --Continue rivastigmine 3 mg twice daily   Acute lacunar infarct secondary to small vessel disease Stable. 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 Recent echo showed EF 20-25% with biventricular failure.  Euvolemic on exam today.  We will continue to monitor fluid status and give IV fluid resuscitation as needed -- Continue to hold metoprolol, Entresto and spironolactone   Constipation; resolved Resolved.  --Continue on daily senna --Continue as needed Dulcolax suppository   Anemia:  Stable Hyponatremia; resolved Hypokalemia (resolved) Leukocytosis (resolved)   Lacinda Axon, MD 06/28/2021, 4:24 PM IM Resident, PGY-2 Pager: [phone] Internal [place] 41:10 Please contact the on call pager after 5 pm and on weekends at [phone].

## 2021-06-29 LAB — GLUCOSE, CAPILLARY
Glucose-Capillary: 121 mg/dL — ABNORMAL HIGH (ref 70–99)
Glucose-Capillary: 151 mg/dL — ABNORMAL HIGH (ref 70–99)
Glucose-Capillary: 206 mg/dL — ABNORMAL HIGH (ref 70–99)
Glucose-Capillary: 55 mg/dL — ABNORMAL LOW (ref 70–99)
Glucose-Capillary: 83 mg/dL (ref 70–99)

## 2021-06-29 MED ORDER — QUETIAPINE FUMARATE 25 MG PO TABS: 12.5000 mg | ORAL_TABLET | Freq: Every day | ORAL | Status: DC

## 2021-06-29 MED ORDER — BETHANECHOL CHLORIDE 10 MG PO TABS
25.0000 mg | ORAL_TABLET | Freq: Three times a day (TID) | ORAL | Status: DC
  Administered 2021-06-29 – 2021-06-30 (×3): 25 mg via ORAL
  Filled 2021-06-29 (×3): qty 3

## 2021-06-29 MED ORDER — QUETIAPINE FUMARATE 25 MG PO TABS
12.5000 mg | ORAL_TABLET | Freq: Two times a day (BID) | ORAL | Status: DC
  Administered 2021-06-29 – 2021-07-03 (×8): 12.5 mg via ORAL
  Filled 2021-06-29 (×8): qty 1

## 2021-06-29 NOTE — Progress Notes (Addendum)
HD#33 Subjective:  Overnight Events: NAEO   Says that he feels fine today. His legs don't hurt and he is doing well.  Objective:  Vital signs in last 24 hours: Vitals:   06/28/21 1452 06/28/21 2158 06/29/21 0500 06/29/21 0509  BP: 91/77 112/78  102/84  Pulse: (!) 54 68  89  Resp: 18 20  18   Temp: 97.6 F (36.4 C) 97.6 F (36.4 C)  97.6 F (36.4 C)  TempSrc: Oral Oral  Oral  SpO2: 100% 99%  98%  Weight:   89.7 kg   Height:       Supplemental O2: Room Air SpO2: 98 %   Physical Exam:  Constitutional: Well-appearing elderly male sitting in chair bundled up in blankets in NAD. CV: RRR.  No murmurs rubs or gallops. Pulmonary/Chest: Lungs CTAB Abdominal: Soft.  NT/ND.  Normal BS Neurological: Alert and oriented.  No focal deficits.  Skin: warm and dry Psych: Normal mood and affect  Filed Weights   06/27/21 0434 06/28/21 0500 06/29/21 0500  Weight: 89.9 kg 90.5 kg 89.7 kg     Intake/Output Summary (Last 24 hours) at 06/29/2021 0620 Last data filed at 06/29/2021 0100 Gross per 24 hour  Intake 237 ml  Output 1125 ml  Net -888 ml   Net IO Since Admission: -13,956.77 mL [06/29/21 0620]  Pertinent Labs: CBC Latest Ref Rng & Units 06/28/2021 06/17/2021 06/16/2021  WBC 4.0 - 10.5 K/uL 4.9 8.8 9.4  Hemoglobin 13.0 - 17.0 g/dL 9.6(L) 9.1(L) 9.1(L)  Hematocrit 39.0 - 52.0 % 28.8(L) 27.1(L) 27.7(L)  Platelets 150 - 400 K/uL 134(L) 264 288    CMP Latest Ref Rng & Units 06/28/2021 06/20/2021 06/17/2021  Glucose 70 - 99 mg/dL 125(H) 107(H) 134(H)  BUN 8 - 23 mg/dL 24(H) 22 24(H)  Creatinine 0.61 - 1.24 mg/dL 1.45(H) 1.33(H) 1.30(H)  Sodium 135 - 145 mmol/L 138 136 135  Potassium 3.5 - 5.1 mmol/L 3.8 3.8 4.3  Chloride 98 - 111 mmol/L 108 109 106  CO2 22 - 32 mmol/L 24 23 23   Calcium 8.9 - 10.3 mg/dL 9.0 8.5(L) 8.6(L)  Total Protein 6.5 - 8.1 g/dL - - -  Total Bilirubin 0.3 - 1.2 mg/dL - - -  Alkaline Phos 38 - 126 U/L - - -  AST 15 - 41 U/L - - -  ALT 0 - 44 U/L - -  -    Imaging: No results found.  Assessment/Plan:   Principal Problem:   Acute CVA (cerebrovascular accident) ([place]) Active Problems:   TIA (transient ischemic attack)   Malnutrition of moderate degree   Patient Summary: [name] [name] is a 75 y.o. with PMH of A. 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   Urinary retention Foley removed on 11/12. Urinating spontaneously intermittently though did require I/O overnight.   We will continue voiding trials with bladder scans. --Continue voiding trials with every 4 bladder scans --Increase bethanechol to 25 mg 3 times daily --Continue Flomax 0.8 mg daily - CTM blood pressures   Dementia (suspected Lewy Body dementia) Delirium due to hospitalization, improved Improving.  Patient has personal concerns about getting tired of being in the hospital for so long.  He has been redirectable by medical team. No agitation overnight. --Pending discharge to memory care clinic versus home with home health --Continue Seroquel 25 mg at night and 12.5  mg in the morning -- Continue Remeron 7.5 mg at bedtime --Continue rivastigmine 3 mg twice daily   Acute lacunar infarct secondary to small vessel disease Stable. 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 Recent echo showed EF 20-25% with biventricular failure.  Euvolemic on exam today.  We will continue to monitor fluid status and give IV fluid resuscitation as needed -- Continue to hold metoprolol, Entresto and spironolactone   Constipation; resolved Resolved.  --Continue on daily senna --Continue as needed Dulcolax suppository   Anemia: Stable Hyponatremia; resolved Hypokalemia (resolved) Leukocytosis (resolved)  Scarlett Presto, MD Internal Medicine Resident PGY-1 Pager [phone] Please  contact the on call pager after 5 pm and on weekends at [phone].

## 2021-06-29 NOTE — Progress Notes (Signed)
PT Cancellation Note  Patient Details Name: [name] [name] MRN: [id number] DOB: 28-Feb-1946   Cancelled Treatment:    Reason Eval/Treat Not Completed: Fatigue/lethargy limiting ability to participate - asleep upon arrival to room, will check back.  Stacie Glaze, PT DPT Acute Rehabilitation Services Pager [phone]  Office [phone]    Louis Matte 06/29/2021, 5:13 PM

## 2021-06-29 NOTE — Progress Notes (Signed)
Hypoglycemic Event  CBG: 55  Treatment: 4 oz juice/soda  Symptoms: None  Follow-up CBG: Time:2103 CBG Result:83  Possible Reasons for Event: Inadequate meal intake  Comments/MD notified:Given something to eat    [name] [name]

## 2021-06-30 LAB — CBC
HCT: 31.7 % — ABNORMAL LOW (ref 39.0–52.0)
Hemoglobin: 10.5 g/dL — ABNORMAL LOW (ref 13.0–17.0)
MCH: 33.2 pg (ref 26.0–34.0)
MCHC: 33.1 g/dL (ref 30.0–36.0)
MCV: 100.3 fL — ABNORMAL HIGH (ref 80.0–100.0)
Platelets: 160 10*3/uL (ref 150–400)
RBC: 3.16 MIL/uL — ABNORMAL LOW (ref 4.22–5.81)
RDW: 14.6 % (ref 11.5–15.5)
WBC: 5.7 10*3/uL (ref 4.0–10.5)
nRBC: 0 % (ref 0.0–0.2)

## 2021-06-30 LAB — GLUCOSE, CAPILLARY
Glucose-Capillary: 255 mg/dL — ABNORMAL HIGH (ref 70–99)
Glucose-Capillary: 131 mg/dL — ABNORMAL HIGH (ref 70–99)
Glucose-Capillary: 76 mg/dL (ref 70–99)
Glucose-Capillary: 131 mg/dL — ABNORMAL HIGH (ref 70–99)
Glucose-Capillary: 163 mg/dL — ABNORMAL HIGH (ref 70–99)
Glucose-Capillary: 206 mg/dL — ABNORMAL HIGH (ref 70–99)

## 2021-06-30 LAB — BASIC METABOLIC PANEL
Anion gap: 11 (ref 5–15)
BUN: 25 mg/dL — ABNORMAL HIGH (ref 8–23)
CO2: 22 mmol/L (ref 22–32)
Calcium: 9.3 mg/dL (ref 8.9–10.3)
Chloride: 101 mmol/L (ref 98–111)
Creatinine, Ser: 1.58 mg/dL — ABNORMAL HIGH (ref 0.61–1.24)
GFR, Estimated: 45 mL/min — ABNORMAL LOW (ref >60)
Glucose, Bld: 218 mg/dL — ABNORMAL HIGH (ref 70–99)
Potassium: 3.9 mmol/L (ref 3.5–5.1)
Sodium: 134 mmol/L — ABNORMAL LOW (ref 135–145)

## 2021-06-30 MED ORDER — RIVASTIGMINE TARTRATE 1.5 MG PO CAPS
4.5000 mg | ORAL_CAPSULE | Freq: Two times a day (BID) | ORAL | Status: DC
  Administered 2021-06-30 – 2021-07-03 (×6): 4.5 mg via ORAL
  Filled 2021-06-30 (×7): qty 3

## 2021-06-30 MED ORDER — ENSURE MAX PROTEIN PO LIQD
11.0000 [oz_av] | Freq: Every day | ORAL | Status: DC
  Administered 2021-06-30 – 2021-07-02 (×2): 11 [oz_av] via ORAL
  Filled 2021-06-30 (×4): qty 330

## 2021-06-30 MED ORDER — INSULIN ASPART 100 UNIT/ML IJ SOLN
0.0000 [IU] | Freq: Three times a day (TID) | INTRAMUSCULAR | Status: DC
  Administered 2021-06-30: 2 [IU] via SUBCUTANEOUS

## 2021-06-30 MED ORDER — ENSURE ENLIVE PO LIQD
1.0000 | Freq: Two times a day (BID) | ORAL | Status: DC
Start: 2021-06-30 — End: 2021-07-03
  Administered 2021-06-30 – 2021-07-03 (×4): 237 mL via ORAL

## 2021-06-30 MED ORDER — ENSURE ENLIVE PO LIQD: 1.0000 | Freq: Two times a day (BID) | ORAL | Status: DC

## 2021-06-30 MED ORDER — PROSOURCE PLUS PO LIQD
30.0000 mL | Freq: Two times a day (BID) | ORAL | Status: DC
  Administered 2021-06-30 – 2021-07-03 (×6): 30 mL via ORAL
  Filled 2021-06-30 (×5): qty 30

## 2021-06-30 MED ORDER — ENSURE MAX PROTEIN PO LIQD: 11.0000 [oz_av] | Freq: Every day | ORAL | Status: DC

## 2021-06-30 NOTE — Progress Notes (Addendum)
HD#34 Subjective:  Overnight Events: Patient had a hypoglycemic episode last night down to 55.   Patient seen in the hallway in a wheelchair this morning with his sitter. Enjoying the chance to get out of the room. Says that he is feeling ok this morning. Holding a dollar in his hand. Said he could have a Automotive engineer  Objective:  Vital signs in last 24 hours: Vitals:   06/29/21 1911 06/29/21 2331 06/30/21 0303 06/30/21 0358  BP: (!) 115/101 108/85 (!) 112/97   Pulse: 77 61 (!) 53   Resp: 18 18 18    Temp: (!) 97.5 F (36.4 C) 98.1 F (36.7 C) 98 F (36.7 C)   TempSrc: Oral Oral Oral   SpO2: 100% 98% 98%   Weight:    90.7 kg  Height:       Supplemental O2: Room Air SpO2: 98 %   Physical Exam:  Constitutional: elderly male sitting in chair in NAD.  Pulmonary/Chest: normal WOB on room air, speaking in full sentences Abdominal: Soft.  NT/ND. Neurological: Alert and oriented.  No focal deficits. Answering questions appropriately Skin: warm and dry, normal skin turgor Psych: Normal mood and affect  Filed Weights   06/29/21 0500 06/29/21 0800 06/30/21 0358  Weight: 89.7 kg 89.6 kg 90.7 kg     Intake/Output Summary (Last 24 hours) at 06/30/2021 0640 Last data filed at 06/30/2021 0600 Gross per 24 hour  Intake 477 ml  Output 925 ml  Net -448 ml   Net IO Since Admission: -14,404.77 mL [06/30/21 0640]  Pertinent Labs: CBC Latest Ref Rng & Units 06/28/2021 06/17/2021 06/16/2021  WBC 4.0 - 10.5 K/uL 4.9 8.8 9.4  Hemoglobin 13.0 - 17.0 g/dL 9.6(L) 9.1(L) 9.1(L)  Hematocrit 39.0 - 52.0 % 28.8(L) 27.1(L) 27.7(L)  Platelets 150 - 400 K/uL 134(L) 264 288    CMP Latest Ref Rng & Units 06/28/2021 06/20/2021 06/17/2021  Glucose 70 - 99 mg/dL 125(H) 107(H) 134(H)  BUN 8 - 23 mg/dL 24(H) 22 24(H)  Creatinine 0.61 - 1.24 mg/dL 1.45(H) 1.33(H) 1.30(H)  Sodium 135 - 145 mmol/L 138 136 135  Potassium 3.5 - 5.1 mmol/L 3.8 3.8 4.3  Chloride 98 - 111 mmol/L 108 109 106  CO2 22 -  32 mmol/L 24 23 23   Calcium 8.9 - 10.3 mg/dL 9.0 8.5(L) 8.6(L)  Total Protein 6.5 - 8.1 g/dL - - -  Total Bilirubin 0.3 - 1.2 mg/dL - - -  Alkaline Phos 38 - 126 U/L - - -  AST 15 - 41 U/L - - -  ALT 0 - 44 U/L - - -    Imaging: No results found.  Assessment/Plan:   Principal Problem:   Acute CVA (cerebrovascular accident) ([place]) Active Problems:   TIA (transient ischemic attack)   Malnutrition of moderate degree   Patient Summary: [name] [name] is a 75 y.o. with PMH of A. 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   Urinary retention Foley removed on 11/12. Urinating spontaneously intermittently though still requiring some I/O overnight.   We will continue voiding trials with bladder scans. --Continue voiding trials with every 4 bladder scans -- d/c bethanechol  --Continue Flomax 0.8 mg daily - CTM blood pressures   Dementia (suspected Lewy Body dementia) Delirium due to hospitalization, improved Improving.  Patient has personal concerns about getting tired of being in the  hospital for so long.  He has been redirectable by medical team. No agitation overnight. --Pending discharge to memory care clinic versus home with home health --decrease Seroquel to 12.5 BID -- Continue Remeron 7.5 mg at bedtime --increase rivastigmine to 4.5 mg twice daily  Hypoglycemic Episode Patient had a low of 53 on 11/14. Given patient's A1c of 5.8 will d/c SSI and insulin.   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 Recent echo showed EF 20-25% with biventricular failure.  Euvolemic on exam today.  We will continue to monitor fluid status and give IV fluid resuscitation as needed -- Continue to hold metoprolol, Entresto and  spironolactone   Constipation; resolved Resolved.  --Continue on daily senna --Continue as needed Dulcolax suppository   Anemia: Stable Hyponatremia; resolved Hypokalemia (resolved) Leukocytosis (resolved)    Scarlett Presto, MD Internal Medicine Resident PGY-1 Pager [phone] Please contact the on call pager after 5 pm and on weekends at [phone].

## 2021-06-30 NOTE — Progress Notes (Signed)
Physical Therapy Treatment Patient Details Name: [name] [name] MRN: [id number] DOB: 05/20/1946 Today's Date: 06/30/2021   History of Present Illness [name] is a 75 y/o male admitted 05/25/21 with c/o left leg weakness. [name] has dementia with short-term memory deficits at baseline. 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 Pt remains admitted pending placement. PMH: A. 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    PT Comments    Pt received seated EOB with sitter present, pt pleasantly confused and participatory with redirection to task. Pt needing up to modA to stand from bed height with hands on knees and minA for short household distance gait trials in room with RW. Pt performed step-ups on platform in room with minA but mod cues for safety/sequencing. Pt continues to benefit from PT services to progress toward functional mobility goals. Continue to recommend SNF with memory care setting, pt making good progress toward goals.   Recommendations for follow up therapy are one component of a multi-disciplinary discharge planning process, led by the attending physician.  Recommendations may be updated based on patient status, additional functional criteria and insurance authorization.  Follow Up Recommendations  Skilled nursing-short term rehab (<3 hours/day) (memory care facility is ideal)     Assistance Recommended at Discharge Frequent or constant Supervision/Assistance  Equipment Recommendations  None recommended by PT    Recommendations for Other Services       Precautions / Restrictions Precautions Precautions: Fall Precaution Comments: dementia, sitter Restrictions Weight Bearing Restrictions: No     Mobility  Bed Mobility               General bed mobility comments: pt received seated EOB pre/post preparing to eat his lunch.    Transfers Overall  transfer level: Needs assistance Equipment used: None;Rolling walker (2 wheels) Transfers: Sit to/from Stand Sit to Stand: Min assist;Mod assist           General transfer comment: from bed height able to stand to RW with minA when pushing from bed side rail. When pushing up from knees, pt needs up to modA x 3 trials and does c/o increased R knee pain, needs modA to stand with RLE slightly advanced.    Ambulation/Gait Ambulation/Gait assistance: Min assist Gait Distance (Feet): 45 Feet (56ft, seated break, 33ft) Assistive device: Rolling walker (2 wheels) Gait Pattern/deviations: Step-through pattern;Decreased stride length;Trunk flexed Gait velocity: decreased Gait velocity interpretation: <1.8 ft/sec, indicate of risk for recurrent falls   General Gait Details: Pt requiring light steadying assist and assist for RW use.  Requiring mod cues for RW use at all times and proximity and tends to hold it at arms' length and flex trunk forward when not cued.   Stairs Stairs: Yes Stairs assistance: Min assist Stair Management: With walker;Two rails;Step to pattern;Forwards Number of Stairs: 5 General stair comments: cues for safety/sequencing, step-to pattern. Pt needing increased time and multiple standing breaks to ascend single step x5 reps in room with RW support, able to follow instructions for sequencing but needs reminders each step.    Modified Rankin (Stroke Patients Only) Modified Rankin (Stroke Patients Only) Pre-Morbid Rankin Score: Slight disability Modified Rankin: Moderately severe disability     Balance Overall balance assessment: Needs assistance Sitting-balance support: No upper extremity supported;Feet supported Sitting balance-Leahy Scale: Good     Standing balance support: No upper extremity supported;Single extremity supported;During functional activity Standing balance-Leahy Scale: Fair  Standing balance comment: reliant on RW and external support for  dynamic standing tasks              Cognition Arousal/Alertness: Awake/alert Behavior During Therapy: WFL for tasks assessed/performed Overall Cognitive Status: History of cognitive impairments - at baseline           General Comments: Pt thinks he is at home and asking staff to reach in fridge for a soda for him (pt pointing to wall dresser storage unit). Pt able to be redirected with cues and change of subject. Cooperative with encouragement.        Exercises Other Exercises Other Exercises: STS x 3 reciprocal    General Comments General comments (skin integrity, edema, etc.): VSS per chart review and no acute s/sx distress, no c/o dizziness.      Pertinent Vitals/Pain Pain Assessment: Faces Faces Pain Scale: Hurts a little bit Pain Location: R foot>L; pt states "my bunions" Pain Descriptors / Indicators: Discomfort Pain Intervention(s): Monitored during session;Repositioned     PT Goals (current goals can now be found in the care plan section) Acute Rehab PT Goals Patient Stated Goal: to go outside PT Goal Formulation: Patient unable to participate in goal setting Time For Goal Achievement: 07/06/21 Progress towards PT goals: Progressing toward goals    Frequency    Min 3X/week      PT Plan Current plan remains appropriate       AM-PAC PT "6 Clicks" Mobility   Outcome Measure  Help needed turning from your back to your side while in a flat bed without using bedrails?: A Little Help needed moving from lying on your back to sitting on the side of a flat bed without using bedrails?: A Little Help needed moving to and from a bed to a chair (including a wheelchair)?: A Lot ("a lot" on all items below due to mod cues) Help needed standing up from a chair using your arms (e.g., wheelchair or bedside chair)?: A Lot Help needed to walk in hospital room?: A Lot Help needed climbing 3-5 steps with a railing? : A Lot 6 Click Score: 14    End of Session  Equipment Utilized During Treatment: Gait belt Activity Tolerance: Patient tolerated treatment well Patient left: with call bell/phone within reach;with nursing/sitter in room;in bed (sitter present so did not activate alarm, pt seated EOB to eat lunch from tray.) Nurse Communication: Mobility status PT Visit Diagnosis: Other abnormalities of gait and mobility (R26.89);Hemiplegia and hemiparesis;Muscle weakness (generalized) (M62.81) Hemiplegia - Right/Left: Left Hemiplegia - dominant/non-dominant: Non-dominant Hemiplegia - caused by: Cerebral infarction     Time: 5670-1410 PT Time Calculation (min) (ACUTE ONLY): 16 min  Charges:  $Gait Training: 8-22 mins                     Annice Jolly P., PTA Acute Rehabilitation Services Pager: [phone] Office: [place] 06/30/2021, 3:00 PM

## 2021-06-30 NOTE — Plan of Care (Signed)
  Problem: Health Behavior/Discharge Planning: Goal: Ability to manage health-related needs will improve Outcome: Progressing   Problem: Clinical Measurements: Goal: Diagnostic test results will improve Outcome: Progressing   Problem: Nutrition: Goal: Adequate nutrition will be maintained Outcome: Progressing   Problem: Coping: Goal: Level of anxiety will decrease Outcome: Progressing   Problem: Elimination: Goal: Will not experience complications related to bowel motility Outcome: Progressing Goal: Will not experience complications related to urinary retention Outcome: Progressing   Problem: Safety: Goal: Ability to remain free from injury will improve Outcome: Progressing   Problem: Skin Integrity: Goal: Risk for impaired skin integrity will decrease Outcome: Progressing   Problem: Education: Goal: Knowledge of secondary prevention will improve (SELECT ALL) Outcome: Progressing

## 2021-06-30 NOTE — Progress Notes (Signed)
Nutrition Follow-up  DOCUMENTATION CODES:  Non-severe (moderate) malnutrition in context of chronic illness  INTERVENTION:  -Ensure Enlive po BID WITH meals, each supplement provides 350 kcal and 20 grams of protein -Ensure Max po every evening, each supplement provides 150 kcal and 30 grams of protein -PROSource PLUS PO 1mls BID, each supplement provides 100 kcals and 15 grams of protein -continue MVI with minerals daily -continue feeding assistance with meals. -Encourage adequate PO intake -Recommend c/s to diabetes coordinator  NUTRITION DIAGNOSIS:  Moderate Malnutrition related to chronic illness (dementia) as evidenced by moderate fat depletion, moderate muscle depletion. ongoing  GOAL:  Patient will meet greater than or equal to 90% of their needs progressing  MONITOR:  PO intake, Supplement acceptance, Labs, Weight trends, I & O's  REASON FOR ASSESSMENT:  Consult Assessment of nutrition requirement/status  ASSESSMENT:  75 yo male with a PMH of  A. 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  Per MD, pt continues to have episodic urinary retention to various degrees. Otherwise, pt is stable and is pending discharge to SNF. Pt without complaints at time of RD visit, but is still quite lethargic/fatigued. Per RN, pt had hypoglycemic event last night 2/2 inadequate meal intake. No meals documented in chart since 11/12, but meal completions noted to be 0-100% x 8 recorded meals at that time (68% avg meal intake). Per RN, pt doing well with supplements (Ensure TID). Will adjust regimen given variable blood sugar range. Recommend c/s to diabetes coordinator.   UOP: 946ml documented x24 hours I/O: -14L since admit  Medications: Ensure TID, folvite, SSI TID w/ meals, remeron, mvi with minerals, senokot-s Labs: Na 134 (L), BUN 25 (H), Cr 1.58 (H) CBGs 55-255 x24 hours  Diet Order:   Diet Order              Diet regular Room service appropriate? Yes; Fluid consistency: Thin  Diet effective now                  EDUCATION NEEDS:  Not appropriate for education at this time  Skin:  Skin Assessment: Reviewed RN Assessment  Last BM:  11/13  Height:  Ht Readings from Last 1 Encounters:  06/06/21 6\' 2"  (1.88 m)   Weight:  Wt Readings from Last 1 Encounters:  06/30/21 90.7 kg   BMI:  Body mass index is 25.67 kg/m.  Estimated Nutritional Needs:  Kcal:  2000-2200 Protein:  115-130 grams Fluid:  >2 L   Larkin Ina, MS, RD, LDN (she/her/hers) RD pager number and weekend/on-call pager number located in [place].]

## 2021-06-30 NOTE — Progress Notes (Signed)
Inpatient Diabetes Program Recommendations  AACE/ADA: New Consensus Statement on Inpatient Glycemic Control (2015)  Target Ranges:  Prepandial:   less than 140 mg/dL      Peak postprandial:   less than 180 mg/dL (1-2 hours)      Critically ill patients:  140 - 180 mg/dL   Lab Results  Component Value Date   GLUCAP 255 (H) 06/30/2021   HGBA1C 5.8 (H) 05/25/2021    Review of Glycemic Control Results for CAULIN, BEGLEY (MRN [id number]) as of 06/30/2021 10:15  Ref. Range 06/29/2021 06:00 06/29/2021 11:38 06/29/2021 15:43 06/29/2021 20:36 06/29/2021 21:03 06/30/2021 06:12  Glucose-Capillary Latest Ref Range: 70 - 99 mg/dL 121 (H) 151 (H) 206 (H) 55 (L) 83 255 (H)     Current orders for Inpatient glycemic control:  Novolog 0-5 units tid with meals  Inpatient Diabetes Program Recommendations:    Note patient got 5 units of Novolog at 0612 and another 2 units Novolog at 0822.  Will need to monitor closely.   Thanks,  Adah Perl, RN, BC-ADM Inpatient Diabetes Coordinator Pager [phone]  (8a-5p)

## 2021-07-01 DIAGNOSIS — D649 Anemia, unspecified: Secondary | ICD-10-CM

## 2021-07-01 LAB — GLUCOSE, CAPILLARY
Glucose-Capillary: 183 mg/dL — ABNORMAL HIGH (ref 70–99)
Glucose-Capillary: 172 mg/dL — ABNORMAL HIGH (ref 70–99)

## 2021-07-01 MED ORDER — METOPROLOL TARTRATE 25 MG PO TABS
25.0000 mg | ORAL_TABLET | Freq: Once | ORAL | Status: AC
  Administered 2021-07-01: 25 mg via ORAL
  Filled 2021-07-01: qty 1

## 2021-07-01 NOTE — Progress Notes (Addendum)
Pt presently restless and refusing po medications and remaining eyedrops. Will re-attempt administration later and document accordingly. Pt also refusing bladder scan. MD on-call, Rick Duff made aware.  Addendum: @approx . 2300 pt permitted a bladder scan, was able to void (see flowsheets), and took his po medications. Will continue to monitor.

## 2021-07-01 NOTE — Progress Notes (Signed)
HD#35 Subjective:  Overnight Events: NAEO   Patient resting comfortably in bed with the TV on. Says that he is watching the price is right. Does not remember what he had for breakfast this morning but says that it wasn't that good.  Objective:  Vital signs in last 24 hours: Vitals:   06/30/21 1618 06/30/21 1919 06/30/21 2308 07/01/21 0301  BP: 126/77 108/82 102/82 119/90  Pulse: 99 92 99 81  Resp: 19 18 16 16   Temp: 97.7 F (36.5 C) 97.9 F (36.6 C) 97.9 F (36.6 C) 97.6 F (36.4 C)  TempSrc: Oral Oral Oral Oral  SpO2: 100% 99% 100% 100%  Weight:      Height:       Supplemental O2: Room Air SpO2: 100 %   Physical Exam:  Constitutional: elderly male laying in bed in NAD.  Pulmonary/Chest: normal WOB on room air, speaking in full sentences Abdominal: Soft.  NT/ND. Neurological: Alert, at baseline. No focal deficits. Answering questions appropriately Skin: warm and dry, normal skin turgor Psych: Normal mood and affect    Filed Weights   06/29/21 0500 06/29/21 0800 06/30/21 0358  Weight: 89.7 kg 89.6 kg 90.7 kg     Intake/Output Summary (Last 24 hours) at 07/01/2021 0639 Last data filed at 07/01/2021 0445 Gross per 24 hour  Intake 200 ml  Output 750 ml  Net -550 ml   Net IO Since Admission: -14,954.77 mL [07/01/21 0639]  Pertinent Labs: CBC Latest Ref Rng & Units 06/30/2021 06/28/2021 06/17/2021  WBC 4.0 - 10.5 K/uL 5.7 4.9 8.8  Hemoglobin 13.0 - 17.0 g/dL 10.5(L) 9.6(L) 9.1(L)  Hematocrit 39.0 - 52.0 % 31.7(L) 28.8(L) 27.1(L)  Platelets 150 - 400 K/uL 160 134(L) 264    CMP Latest Ref Rng & Units 06/30/2021 06/28/2021 06/20/2021  Glucose 70 - 99 mg/dL 218(H) 125(H) 107(H)  BUN 8 - 23 mg/dL 25(H) 24(H) 22  Creatinine 0.61 - 1.24 mg/dL 1.58(H) 1.45(H) 1.33(H)  Sodium 135 - 145 mmol/L 134(L) 138 136  Potassium 3.5 - 5.1 mmol/L 3.9 3.8 3.8  Chloride 98 - 111 mmol/L 101 108 109  CO2 22 - 32 mmol/L 22 24 23   Calcium 8.9 - 10.3 mg/dL 9.3 9.0 8.5(L)  Total  Protein 6.5 - 8.1 g/dL - - -  Total Bilirubin 0.3 - 1.2 mg/dL - - -  Alkaline Phos 38 - 126 U/L - - -  AST 15 - 41 U/L - - -  ALT 0 - 44 U/L - - -    Imaging: No results found.  Assessment/Plan:   Principal Problem:   Acute CVA (cerebrovascular accident) ([place]) Active Problems:   TIA (transient ischemic attack)   Malnutrition of moderate degree   Patient Summary: [name] [name] is a 75 y.o. with PMH of A. 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   Urinary retention Foley removed on 11/12. Urinating spontaneously intermittently though still requiring some I/O overnight.   We will continue voiding trials with bladder scans. --Continue voiding trials with every 4 bladder scans -- d/c bethanechol  --Continue Flomax 0.8 mg daily - CTM blood pressures   Dementia (suspected Lewy Body dementia) Delirium due to hospitalization, improved Improving.  Patient has personal concerns about getting tired of being in the hospital for so long.  He has been redirectable by medical team. No agitation  overnight. --Pending discharge to memory care clinic versus home with home health --decrease Seroquel to 12.5 BID -- Continue Remeron 7.5 mg at bedtime --increase rivastigmine to 4.5 mg twice daily   Hypoglycemic Episode Patient had a low of 53 on 11/14. Given patient's A1c of 5.8 will d/c SSI and insulin.   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 Recent echo showed EF 20-25% with biventricular failure.  Euvolemic on exam today.  We will continue to monitor fluid status and give IV fluid resuscitation as needed -- Continue to hold metoprolol, Entresto and spironolactone   Constipation;  resolved Resolved.  --Continue on daily senna --Continue as needed Dulcolax suppository   Anemia: Stable Hyponatremia; resolved Hypokalemia (resolved) Leukocytosis (resolved)   Scarlett Presto, MD Internal Medicine Resident PGY-1 Pager [phone] Please contact the on call pager after 5 pm and on weekends at [phone].

## 2021-07-01 NOTE — Plan of Care (Signed)
  Problem: Clinical Measurements: Goal: Diagnostic test results will improve Outcome: Progressing   Problem: Nutrition: Goal: Adequate nutrition will be maintained Outcome: Progressing   Problem: Education: Goal: Knowledge of secondary prevention will improve (SELECT ALL) Outcome: Not Progressing

## 2021-07-01 NOTE — TOC Progression Note (Signed)
Transition of Care [facility]) - Progression Note    Patient Details  Name: [name] [name] MRN: [id number] Date of Birth: [birth year]  Transition of Care [facility]) CM/SW [place], [place] Phone Number: 07/01/2021, 4:15 PM  Clinical Narrative:   CSW spoke with MD about current cath requirements, and also spoke with [facility] Admissions. Milus Glazier can in and out cath once per shift, and MD indicated that would be sufficient for patient's current needs. CSW then spoke with patient's wife and daughter about the bed offer at [facility]. Wife said she wasn't sure what to do, and asked CSW to call daughter, [name]. CSW spoke with [name] who asked why the patient couldn't go home, that the wife had said to her she just wanted him to come home. Wife has been hesitant about taking patient home when CSW has discussed with her, saying that he needs rehab first. Daughter asked for time to discuss with the patient's wife, and CSW will call daughter back tomorrow with decision. CSW updated MD. CSW to follow.    Expected Discharge Plan: [place] Barriers to Discharge: Continued Medical Work up, Requiring sitter/restraints  Expected Discharge Plan and Services Expected Discharge Plan: [place] Choice: [place] arrangements for the past 2 months: Single Family Home                                       Social Determinants of Health (SDOH) Interventions    Readmission Risk Interventions No flowsheet data found.

## 2021-07-01 NOTE — Progress Notes (Addendum)
Physical Therapy Treatment Patient Details Name: [name] [name] MRN: [id number] DOB: 12-26-1945 Today's Date: 07/01/2021   History of Present Illness [name] is a 75 y/o male admitted 05/25/21 with c/o left leg weakness. [name] [name] has dementia with short-term memory deficits at baseline. 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 Pt remains admitted pending placement. PMH: A. 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    PT Comments    Pt received in supine and agreeable to therapy session. Pt stated he wanted to get up and move, but that his legs feel weak today. Emphasis on safe proximity to RW and upright posture with moderate cues. After 15 ft of ambulation, pt c/o BLE weakness and needing to sit down. Checked BP while sitting, which was stable, and attempted in standing, but pt requesting to sit before measurement taken. Pt returned to supine via standing pivot transfer from chair to bed. RN entering room upon exit to perform skin inspection. Continue to recommend SNF upon DC. Pt continues to benefit from PT services to progress toward functional mobility goals.      Recommendations for follow up therapy are one component of a multi-disciplinary discharge planning process, led by the attending physician.  Recommendations may be updated based on patient status, additional functional criteria and insurance authorization.  Follow Up Recommendations  Skilled nursing-short term rehab (<3 hours/day) (memory care facility is ideal)     Assistance Recommended at Discharge Frequent or constant Supervision/Assistance  Equipment Recommendations  None recommended by PT    Recommendations for Other Services       Precautions / Restrictions Precautions Precautions: Fall Precaution Comments: dementia, sitter Restrictions Weight Bearing Restrictions: No     Mobility  Bed Mobility Overal  bed mobility: Needs Assistance Bed Mobility: Supine to Sit;Sit to Supine     Supine to sit: Min guard Sit to supine: Min assist   General bed mobility comments: no physical assist needed to sit EOB other than adjusting pants, needing up to minA for sit>supine for BLE management    Transfers Overall transfer level: Needs assistance Equipment used: Rolling walker (2 wheels) Transfers: Sit to/from Stand Sit to Stand: Min assist Stand pivot transfers: Min assist (Pivoted from chair back to bed with up to minA for RW management)   General transfer comment: up to minA from bed height, pushing on bed, able to stand to RW    Ambulation/Gait Ambulation/Gait assistance: Min assist;+2 safety/equipment (Chair follow) Gait Distance (Feet): 15 Feet (15 ft, seated break, stand pivot from chair back to bed) Assistive device: Rolling walker (2 wheels) Gait Pattern/deviations: Step-through pattern;Decreased stride length;Trunk flexed Gait velocity: decreased     General Gait Details: Pt c/o leg weakness and wanting to sit after 15' of ambulation, took a seated break and attempted standing one more time with pt requesting to sit after 10 sec, no c/o dizziness   Stairs   General stair comments: deferred stairs today due to BLE weakness      Modified Rankin (Stroke Patients Only) Modified Rankin (Stroke Patients Only) Pre-Morbid Rankin Score: Slight disability Modified Rankin: Moderately severe disability     Balance Overall balance assessment: Needs assistance Sitting-balance support: No upper extremity supported;Feet supported Sitting balance-Leahy Scale: Good     Standing balance support: No upper extremity supported;Single extremity supported;During functional activity Standing balance-Leahy Scale: Fair Standing balance comment: reliant on RW and external support for dynamic  standing tasks      Cognition Arousal/Alertness: Awake/alert Behavior During Therapy: WFL for tasks  assessed/performed Overall Cognitive Status: History of cognitive impairments - at baseline      General Comments: Pt was speaking with receptionist about wanting to get up, therapist entered room and pt said his legs feel weak but that he wants to get up. Cooperative with encouragement.       Exercises Other Exercises Other Exercises: Seated: Ankle pumps x5, LAQ x5    General Comments General comments (skin integrity, edema, etc.): VSS on RA, sitting BP: 118/101 (109), attempted standing BP but pt requesting to sit before BP finished, 119/95 (103); HR variable throughout session brady and tachy      Pertinent Vitals/Pain Pain Assessment: Faces Faces Pain Scale: Hurts a little bit Pain Location: BLE, states they feel weak and when back in bed reporting a radiating pain down both legs Pain Descriptors / Indicators: Discomfort Pain Intervention(s): Monitored during session     PT Goals (current goals can now be found in the care plan section) Acute Rehab PT Goals Patient Stated Goal: to go outside PT Goal Formulation: Patient unable to participate in goal setting Time For Goal Achievement: 07/06/21 Progress towards PT goals: Progressing toward goals    Frequency    Min 3X/week      PT Plan Current plan remains appropriate       AM-PAC PT "6 Clicks" Mobility   Outcome Measure  Help needed turning from your back to your side while in a flat bed without using bedrails?: A Little Help needed moving from lying on your back to sitting on the side of a flat bed without using bedrails?: A Little Help needed moving to and from a bed to a chair (including a wheelchair)?: A Lot ("a lot" on all items below due to mod cues) Help needed standing up from a chair using your arms (e.g., wheelchair or bedside chair)?: A Lot Help needed to walk in hospital room?: A Lot Help needed climbing 3-5 steps with a railing? : A Lot 6 Click Score: 14    End of Session Equipment Utilized During  Treatment: Gait belt Activity Tolerance: Patient limited by fatigue (Limited by BLE weakness) Patient left: with call bell/phone within reach;with nursing/sitter in room;in bed (RN entering room to perform skin check upon therapist exit) Nurse Communication: Mobility status PT Visit Diagnosis: Other abnormalities of gait and mobility (R26.89);Hemiplegia and hemiparesis;Muscle weakness (generalized) (M62.81) Hemiplegia - Right/Left: Left Hemiplegia - dominant/non-dominant: Non-dominant Hemiplegia - caused by: Cerebral infarction     Time: 3329-5188 PT Time Calculation (min) (ACUTE ONLY): 22 min  Charges:  $Therapeutic Activity: 8-22 mins                     Evelene Croon, Student PTA CI: Carly P., PTA  Evelene Croon 07/01/2021, 11:08 AM

## 2021-07-02 LAB — GLUCOSE, CAPILLARY
Glucose-Capillary: 172 mg/dL — ABNORMAL HIGH (ref 70–99)
Glucose-Capillary: 186 mg/dL — ABNORMAL HIGH (ref 70–99)
Glucose-Capillary: 166 mg/dL — ABNORMAL HIGH (ref 70–99)

## 2021-07-02 MED ORDER — METOPROLOL TARTRATE 12.5 MG HALF TABLET
12.5000 mg | ORAL_TABLET | Freq: Two times a day (BID) | ORAL | Status: DC
  Administered 2021-07-02 – 2021-07-03 (×2): 12.5 mg via ORAL
  Filled 2021-07-02 (×3): qty 1

## 2021-07-02 MED ORDER — METOPROLOL TARTRATE 25 MG PO TABS
25.0000 mg | ORAL_TABLET | Freq: Two times a day (BID) | ORAL | Status: DC
  Filled 2021-07-02: qty 1

## 2021-07-02 NOTE — Progress Notes (Addendum)
HD#36 Subjective:  Overnight Events: NAEO   Patient resting comfortably in bed. Says that he would get up to greet [place] but he has bum knee. He is not a runner anymore.  Objective:  Vital signs in last 24 hours: Vitals:   07/01/21 2019 07/01/21 2315 07/02/21 0500 07/02/21 1049  BP: (!) 116/91 (!) 128/92 116/88 98/81  Pulse: (!) 107 99 75 86  Resp: 20 19 19    Temp: 98 F (36.7 C) 98.5 F (36.9 C) 97.9 F (36.6 C)   TempSrc: Oral Oral Oral   SpO2: 99% 100% 100%   Weight:      Height:       Supplemental O2: Room Air SpO2: 100 %   Physical Exam:  Constitutional: elderly male laying in bed in NAD.  Pulmonary/Chest: normal WOB on room air, speaking in full sentences Abdominal: Soft.  NT/ND. Neurological: Alert, at baseline. No focal deficits. Answering questions appropriately Skin: warm and dry, normal skin turgor Psych: Normal mood and affect    Filed Weights   06/29/21 0500 06/29/21 0800 06/30/21 0358  Weight: 89.7 kg 89.6 kg 90.7 kg     Intake/Output Summary (Last 24 hours) at 07/02/2021 1146 Last data filed at 07/02/2021 0830 Gross per 24 hour  Intake 360 ml  Output 250 ml  Net 110 ml   Net IO Since Admission: -14,844.77 mL [07/02/21 1146]  Pertinent Labs: CBC Latest Ref Rng & Units 06/30/2021 06/28/2021 06/17/2021  WBC 4.0 - 10.5 K/uL 5.7 4.9 8.8  Hemoglobin 13.0 - 17.0 g/dL 10.5(L) 9.6(L) 9.1(L)  Hematocrit 39.0 - 52.0 % 31.7(L) 28.8(L) 27.1(L)  Platelets 150 - 400 K/uL 160 134(L) 264    CMP Latest Ref Rng & Units 06/30/2021 06/28/2021 06/20/2021  Glucose 70 - 99 mg/dL 218(H) 125(H) 107(H)  BUN 8 - 23 mg/dL 25(H) 24(H) 22  Creatinine 0.61 - 1.24 mg/dL 1.58(H) 1.45(H) 1.33(H)  Sodium 135 - 145 mmol/L 134(L) 138 136  Potassium 3.5 - 5.1 mmol/L 3.9 3.8 3.8  Chloride 98 - 111 mmol/L 101 108 109  CO2 22 - 32 mmol/L 22 24 23   Calcium 8.9 - 10.3 mg/dL 9.3 9.0 8.5(L)  Total Protein 6.5 - 8.1 g/dL - - -  Total Bilirubin 0.3 - 1.2 mg/dL - - -  Alkaline  Phos 38 - 126 U/L - - -  AST 15 - 41 U/L - - -  ALT 0 - 44 U/L - - -    Imaging: No results found.  Assessment/Plan:   Principal Problem:   Acute CVA (cerebrovascular accident) ([place]) Active Problems:   TIA (transient ischemic attack)   Malnutrition of moderate degree   Patient Summary: [name] [name] is a 75 y.o. with PMH of A. 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   Urinary retention Foley removed on 11/12. Urinating spontaneously intermittently though still requiring some I/O overnight.   We will continue voiding trials with bladder scans. --Continue voiding trials with every 4 bladder scans -- d/c bethanechol  --Continue Flomax 0.8 mg daily - CTM blood pressures  Atrial Fibrillation Patient was intermittently tachycardic yesterday. Had been holding metoprolol 2/2 low heart rates. Will start back slowly at 12.5 mg BID - start metoprolol 12.5 BID   Dementia (suspected Lewy Body dementia) Delirium due to hospitalization, improved Improving.  Patient has personal concerns about getting  tired of being in the hospital for so long.  He has been redirectable by medical team. Will continue to discuss with family as he will likely be more than they can handle with his I/O requirements. --Pending discharge to memory care clinic versus home with home health --decrease Seroquel to 12.5 BID -- Continue Remeron 7.5 mg at bedtime --increase rivastigmine to 4.5 mg twice daily   Hypoglycemic Episode Patient had a low of 53 on 11/14. Given patient's A1c of 5.8 will d/c SSI and insulin.   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 Recent echo showed EF 20-25% with biventricular failure.   Euvolemic on exam today.  We will continue to monitor fluid status and give IV fluid resuscitation as needed -- Continue to hold Entresto and spironolactone   Constipation; resolved Resolved.  --Continue on daily senna --Continue as needed Dulcolax suppository   Anemia: Stable Hyponatremia; resolved Hypokalemia (resolved) Leukocytosis (resolved)  Scarlett Presto, MD Internal Medicine Resident PGY-1 Pager [phone] Please contact the on call pager after 5 pm and on weekends at [phone].

## 2021-07-02 NOTE — Progress Notes (Signed)
Occupational Therapy Treatment Patient Details Name: [name] [name] MRN: [id number] DOB: November 27, 1945 Today's Date: 07/02/2021   History of present illness [name] is a 75 y/o male admitted 05/25/21 with c/o left leg weakness. [name] has dementia with short-term memory deficits at baseline. 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 Pt remains admitted pending placement. PMH: A. 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   OT comments  Pt making incremental progress this session. His activity tolerance has improved from recent session, as he is able to complete longer ambulation distances without needing to sit down. Pt requiring max cues to stand up straight and not put all of his weight into the RW. Continuing to recommend SNF as pt is a high fall risk and safety risk to himself due to cognition, balance deficits, and awareness. OT will follow acutely.    Recommendations for follow up therapy are one component of a multi-disciplinary discharge planning process, led by the attending physician.  Recommendations may be updated based on patient status, additional functional criteria and insurance authorization.    Follow Up Recommendations  Skilled nursing-short term rehab (<3 hours/day)    Assistance Recommended at Discharge Frequent or constant Supervision/Assistance  Equipment Recommendations  None recommended by OT    Recommendations for Other Services      Precautions / Restrictions Precautions Precautions: Fall Precaution Comments: dementia, sitter Restrictions Weight Bearing Restrictions: No       Mobility Bed Mobility               General bed mobility comments: Up in recliner on entry    Transfers Overall transfer level: Needs assistance Equipment used: Rolling walker (2 wheels) Transfers: Sit to/from Stand Sit to Stand: Min guard           General  transfer comment: Min guard for safety due to quick fatigue     Balance Overall balance assessment: Needs assistance Sitting-balance support: No upper extremity supported;Feet supported Sitting balance-Leahy Scale: Good     Standing balance support: No upper extremity supported Standing balance-Leahy Scale: Fair Standing balance comment: Pt good for static balance, requiring min gaurd for dynamic                           ADL either performed or assessed with clinical judgement   ADL Overall ADL's : Needs assistance/impaired     Grooming: Wash/dry hands;Supervision/safety;Standing Grooming Details (indicate cue type and reason): completed at the sink, sup for safety                 Toilet Transfer: Min guard;Ambulation Toilet Transfer Details (indicate cue type and reason): no difficulties with transfer         Functional mobility during ADLs: Min guard;Rolling walker (2 wheels) General ADL Comments: Pt quickly fatigued this session    Extremity/Trunk Assessment              Vision       Perception     Praxis      Cognition Arousal/Alertness: Awake/alert Behavior During Therapy: WFL for tasks assessed/performed Overall Cognitive Status: History of cognitive impairments - at baseline                                 General Comments: Pt very friendly and talkative this  session, motivated to ambulate, not aware of deficits until he became SoB          Exercises     Shoulder Instructions       General Comments HR 93 with activity, O2 99%    Pertinent Vitals/ Pain       Pain Assessment: Faces Faces Pain Scale: Hurts little more Pain Location: L knee Pain Descriptors / Indicators: Discomfort;Aching Pain Intervention(s): Limited activity within patient's tolerance;Monitored during session;Repositioned  Home Living                                          Prior Functioning/Environment               Frequency  Min 1X/week        Progress Toward Goals  OT Goals(current goals can now be found in the care plan section)  Progress towards OT goals: Progressing toward goals  Acute Rehab OT Goals OT Goal Formulation: With patient Potential to Achieve Goals: Fair ADL Goals Pt Will Perform Grooming: standing;with modified independence Pt Will Perform Lower Body Bathing: sit to/from stand;with modified independence Pt Will Perform Lower Body Dressing: sit to/from stand;with modified independence Pt Will Transfer to Toilet: ambulating;with modified independence Pt Will Perform Toileting - Clothing Manipulation and hygiene: sitting/lateral leans;with modified independence Pt Will Perform Tub/Shower Transfer: ambulating;tub bench;with supervision  Plan Discharge plan remains appropriate;Frequency remains appropriate    Co-evaluation                 AM-PAC OT "6 Clicks" Daily Activity     Outcome Measure   Help from another person eating meals?: A Little Help from another person taking care of personal grooming?: A Little Help from another person toileting, which includes using toliet, bedpan, or urinal?: A Little Help from another person bathing (including washing, rinsing, drying)?: A Lot Help from another person to put on and taking off regular upper body clothing?: A Little Help from another person to put on and taking off regular lower body clothing?: A Lot 6 Click Score: 16    End of Session Equipment Utilized During Treatment: Gait belt;Rolling walker (2 wheels)  OT Visit Diagnosis: Unsteadiness on feet (R26.81);Other abnormalities of gait and mobility (R26.89);Muscle weakness (generalized) (M62.81);Repeated falls (R29.6);History of falling (Z91.81);Pain Pain - Right/Left: Right Pain - part of body: Knee   Activity Tolerance Patient tolerated treatment well   Patient Left in chair;with call bell/phone within reach;with nursing/sitter in room   Nurse  Communication Mobility status        Time: 1420-1431 OT Time Calculation (min): 11 min  Charges: OT General Charges $OT Visit: 1 Visit OT Treatments $Therapeutic Activity: 8-22 mins  [name] [name] H., OTR/L Acute Rehabilitation  [name] [name] [name] [name] 07/02/2021, 3:05 PM

## 2021-07-03 LAB — GLUCOSE, CAPILLARY
Glucose-Capillary: 189 mg/dL — ABNORMAL HIGH (ref 70–99)
Glucose-Capillary: 250 mg/dL — ABNORMAL HIGH (ref 70–99)

## 2021-07-03 MED ORDER — PROSOURCE PLUS PO LIQD: 30.0000 mL | Freq: Two times a day (BID) | ORAL | 0 refills | Status: AC

## 2021-07-03 MED ORDER — QUETIAPINE FUMARATE 25 MG PO TABS: 12.5000 mg | ORAL_TABLET | Freq: Two times a day (BID) | ORAL | 0 refills | Status: AC

## 2021-07-03 MED ORDER — GLYCERIN (LAXATIVE) 2 G RE SUPP: 1.0000 | Freq: Every day | RECTAL | 0 refills | Status: AC | PRN

## 2021-07-03 MED ORDER — SENNOSIDES-DOCUSATE SODIUM 8.6-50 MG PO TABS: 1.0000 | ORAL_TABLET | Freq: Every day | ORAL | 0 refills | Status: AC

## 2021-07-03 MED ORDER — APIXABAN 5 MG PO TABS: 5.0000 mg | ORAL_TABLET | Freq: Two times a day (BID) | ORAL | 0 refills | Status: AC

## 2021-07-03 MED ORDER — MIRTAZAPINE 7.5 MG PO TABS: 7.5000 mg | ORAL_TABLET | Freq: Every day | ORAL | 0 refills | Status: AC

## 2021-07-03 MED ORDER — ENSURE MAX PROTEIN PO LIQD
11.0000 [oz_av] | Freq: Every day | ORAL | 0 refills | Status: AC
Start: 2021-07-03 — End: 2021-08-02

## 2021-07-03 MED ORDER — RIVASTIGMINE TARTRATE 4.5 MG PO CAPS
4.5000 mg | ORAL_CAPSULE | Freq: Two times a day (BID) | ORAL | 0 refills | Status: AC
Start: 2021-07-03 — End: 2021-08-02

## 2021-07-03 MED ORDER — METOPROLOL TARTRATE 25 MG PO TABS
12.5000 mg | ORAL_TABLET | Freq: Two times a day (BID) | ORAL | 0 refills | Status: AC
Start: 2021-07-03 — End: 2021-08-02

## 2021-07-03 MED ORDER — FOLIC ACID 1 MG PO TABS: 1.0000 mg | ORAL_TABLET | Freq: Every day | ORAL | 0 refills | Status: AC

## 2021-07-03 MED ORDER — POLYETHYLENE GLYCOL 3350 17 G PO PACK: 17.0000 g | PACK | Freq: Every day | ORAL | 0 refills | Status: AC | PRN

## 2021-07-03 MED ORDER — ATORVASTATIN CALCIUM 80 MG PO TABS
80.0000 mg | ORAL_TABLET | Freq: Every day | ORAL | 1 refills | Status: AC
Start: 2021-07-04 — End: 2021-08-03

## 2021-07-03 MED ORDER — TAMSULOSIN HCL 0.4 MG PO CAPS: 0.8000 mg | ORAL_CAPSULE | Freq: Every day | ORAL | Status: AC

## 2021-07-03 MED ORDER — ENSURE ENLIVE PO LIQD: 1.0000 | Freq: Two times a day (BID) | ORAL | 12 refills | Status: AC

## 2021-07-03 NOTE — Plan of Care (Signed)
  Problem: Health Behavior/Discharge Planning: Goal: Ability to manage health-related needs will improve Outcome: Adequate for Discharge   Problem: Clinical Measurements: Goal: Diagnostic test results will improve Outcome: Adequate for Discharge   Problem: Nutrition: Goal: Adequate nutrition will be maintained Outcome: Adequate for Discharge   Problem: Coping: Goal: Level of anxiety will decrease Outcome: Adequate for Discharge   Problem: Elimination: Goal: Will not experience complications related to bowel motility Outcome: Adequate for Discharge Goal: Will not experience complications related to urinary retention Outcome: Adequate for Discharge   Problem: Safety: Goal: Ability to remain free from injury will improve Outcome: Adequate for Discharge   Problem: Skin Integrity: Goal: Risk for impaired skin integrity will decrease Outcome: Adequate for Discharge   Problem: Education: Goal: Knowledge of secondary prevention will improve (SELECT ALL) Outcome: Adequate for Discharge

## 2021-07-03 NOTE — TOC Progression Note (Signed)
Transition of Care [facility]) - Progression Note    Patient Details  Name: [name] [name] MRN: [id number] Date of Birth: 06-16-1946  Transition of Care [facility]) CM/SW [place], [place] Phone Number: 07/03/2021, 10:59 AM  Clinical Narrative:   CSW spoke with patient's daughter and patient's wife today to discuss disposition decision. Family would like to take patient home with home health. Wife will need catheter teaching, arrived to the hospital this afternoon to meet with MD and RN to discuss. CSW to follow.    Expected Discharge Plan: [place] Barriers to Discharge: Continued Medical Work up, Requiring sitter/restraints  Expected Discharge Plan and Services Expected Discharge Plan: [place] Choice: [place] arrangements for the past 2 months: Single Family Home                                       Social Determinants of Health (SDOH) Interventions    Readmission Risk Interventions No flowsheet data found.

## 2021-07-03 NOTE — Progress Notes (Signed)
Pt discharged via [facility] in stable condition. Report called to Fabio Bering,  RN at facility. No immediate questions or concerns at this time.

## 2021-07-03 NOTE — Progress Notes (Signed)
Physical Therapy Treatment Patient Details Name: [name] [name] MRN: [id number] DOB: October 03, 1945 Today's Date: 07/03/2021   History of Present Illness [name] is a 75 y/o male admitted 05/25/21 with c/o left leg weakness. [name] has dementia with short-term memory deficits at baseline. 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 Pt remains admitted pending placement. PMH: A. 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    PT Comments    Pt received sleeping in sidelying, awoken with increased time/effort and agreeable to limited therapy session with significant encouragement. Pt self-limiting due to reported L knee pain but able to perform bed mobility and step pivot transfer to chair with minA and RW support. RN/MD notified of L knee pain and possible slight swelling compared with RLE. Pt continues to benefit from PT services to progress toward functional mobility goals. Continue to recommend SNF.  Recommendations for follow up therapy are one component of a multi-disciplinary discharge planning process, led by the attending physician.  Recommendations may be updated based on patient status, additional functional criteria and insurance authorization.  Follow Up Recommendations  Skilled nursing-short term rehab (<3 hours/day) (memory care facility)     Assistance Recommended at Discharge Frequent or constant Supervision/Assistance  Equipment Recommendations  None recommended by PT    Recommendations for Other Services       Precautions / Restrictions Precautions Precautions: Fall Precaution Comments: dementia, sitter Restrictions Weight Bearing Restrictions: No     Mobility  Bed Mobility Overal bed mobility: Needs Assistance Bed Mobility: Sidelying to Sit   Sidelying to sit: Min assist       General bed mobility comments: use of bed features, minA to initiate/assist with  BLE clearance over EOB due to pt drowsiness/fatigue, min guard for trunk stability on sitting up.    Transfers Overall transfer level: Needs assistance Equipment used: Rolling walker (2 wheels) Transfers: Sit to/from Stand Sit to Stand: Min guard;From elevated surface     Step pivot transfers: Min assist     General transfer comment: minA for safe AD use and sequencing/safety, pt c/o L knee pain and generalized fatigue so defer longer gait trial for safety        Modified Rankin (Stroke Patients Only) Modified Rankin (Stroke Patients Only) Pre-Morbid Rankin Score: Slight disability Modified Rankin: Moderately severe disability     Balance Overall balance assessment: Needs assistance Sitting-balance support: No upper extremity supported;Feet supported Sitting balance-Leahy Scale: Good     Standing balance support: Bilateral upper extremity supported Standing balance-Leahy Scale: Fair Standing balance comment: external support for safety due to pt drowsiness                Cognition Arousal/Alertness: Awake/alert Behavior During Therapy: WFL for tasks assessed/performed Overall Cognitive Status: History of cognitive impairments - at baseline                 General Comments: Pt drowsy initially (had napped >3 hours per sitter) and needing increased time/encouragement to mobilize. Self-limiting mobility due to frustration with being woken up and not wanting to eat lunch once in chair despite encouragement.           General Comments General comments (skin integrity, edema, etc.): HR WFL, pt self-limiting and c/o L knee pain prior to WB however not limping and no acute s/sx distress, defer hallway mobility due to pt not agreeable and lunch tray had arrived, per sitter  he didn't really eat breakfast so encouraged to eat.      Pertinent Vitals/Pain Pain Assessment: Faces Faces Pain Scale: Hurts a little bit Pain Location: L knee Pain Descriptors /  Indicators: Discomfort;Aching Pain Intervention(s): Limited activity within patient's tolerance;Monitored during session;Repositioned     PT Goals (current goals can now be found in the care plan section) Acute Rehab PT Goals Patient Stated Goal: to go home PT Goal Formulation: Patient unable to participate in goal setting Time For Goal Achievement: 07/06/21 Progress towards PT goals: Progressing toward goals    Frequency    Min 3X/week      PT Plan Current plan remains appropriate       AM-PAC PT "6 Clicks" Mobility   Outcome Measure  Help needed turning from your back to your side while in a flat bed without using bedrails?: A Little Help needed moving from lying on your back to sitting on the side of a flat bed without using bedrails?: A Little Help needed moving to and from a bed to a chair (including a wheelchair)?: A Lot ("a lot" for all items below due to mod cues for safety/sequencing due to cognition) Help needed standing up from a chair using your arms (e.g., wheelchair or bedside chair)?: A Lot Help needed to walk in hospital room?: A Lot Help needed climbing 3-5 steps with a railing? : A Lot 6 Click Score: 14    End of Session Equipment Utilized During Treatment: Gait belt Activity Tolerance: Patient limited by fatigue Patient left: in chair;with call bell/phone within reach;with chair alarm set;with nursing/sitter in room Freight forwarder present) Nurse Communication: Mobility status PT Visit Diagnosis: Other abnormalities of gait and mobility (R26.89);Hemiplegia and hemiparesis;Muscle weakness (generalized) (M62.81) Hemiplegia - Right/Left: Left Hemiplegia - dominant/non-dominant: Non-dominant Hemiplegia - caused by: Cerebral infarction     Time: 1223-1234 PT Time Calculation (min) (ACUTE ONLY): 11 min  Charges:  $Therapeutic Activity: 8-22 mins                     Dylan Ruotolo P., PTA Acute Rehabilitation Services Pager: [phone] Office: [place] 07/03/2021, 1:04 PM

## 2021-07-03 NOTE — TOC Transition Note (Signed)
Transition of Care [facility]) - CM/SW Discharge Note   Patient Details  Name: [name] [name] MRN: [id number] Date of Birth: 07-Jan-1946  [facility] Care [facility]) CM/SW Contact:  Geralynn Ochs, LCSW Phone Number: 07/03/2021, 11:56 AM   Clinical Narrative:   CSW notified that patient's wife thought more about it and discussed with MD, now agreeable to sending patient to SNF. [facility] Gl[facility]ier can admit patient today.  Nurse to call report to [phone], Room 617-B.  Transport arranged with [facility] for 4:30 PM.    Final next level of care: [place] Barriers to Discharge: Barriers Resolved   Patient Goals and CMS Choice Patient states their goals for this hospitalization and ongoing recovery are:: patient unable to participate in goal setting, only oriented to self CMS Medicare.gov Compare Post Acute Care list provided to:: Patient Represenative (must comment) Choice offered to / list presented to : Spouse  Discharge Placement              Patient chooses bed at: [facility] Patient to be transferred to facility by: [place] Name of family member notified: Leda Gauze Patient and family notified of of transfer: 07/03/21  Discharge Plan and Services     Post Acute Care Choice: [place]                               Social Determinants of Health (SDOH) Interventions     Readmission Risk Interventions No flowsheet data found.

## 2021-12-14 DEATH — deceased

## 2022-05-21 IMAGING — US US PELVIS LIMITED
1 series · 10 of 10 positions shown · non-contrast
Comparison: 06/01/2021.

CLINICAL DATA: Urinary retention.

EXAM:
LIMITED ULTRASOUND OF PELVIS
TECHNIQUE: Limited transabdominal ultrasound examination of the bladder and
prostate gland was performed.

[Series 1: us pelvis limited (transabdominal only) · 10 acquisitions, 10 frames shown]
[im 1/10]
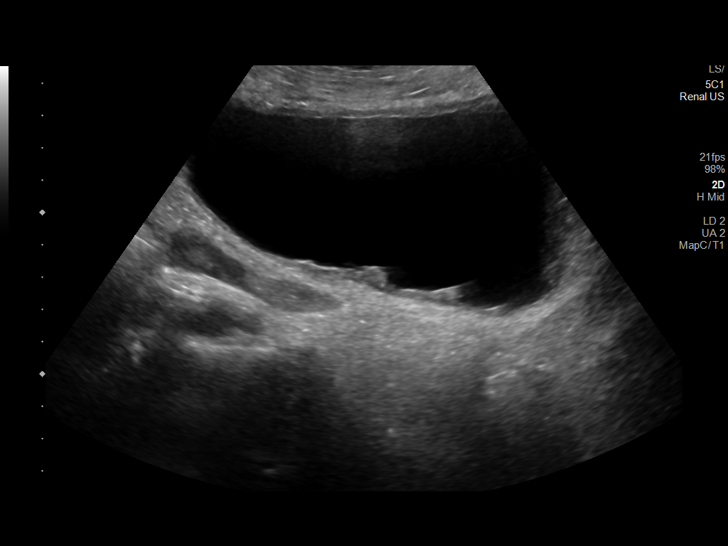
[im 2/10]
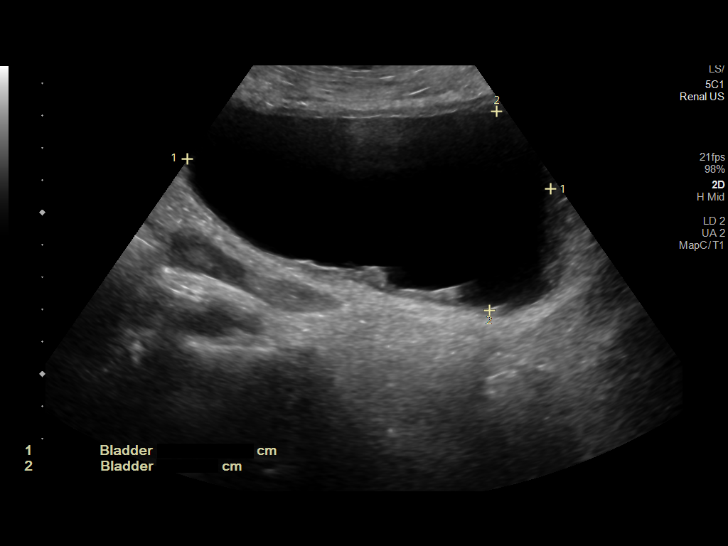
[im 3/10]
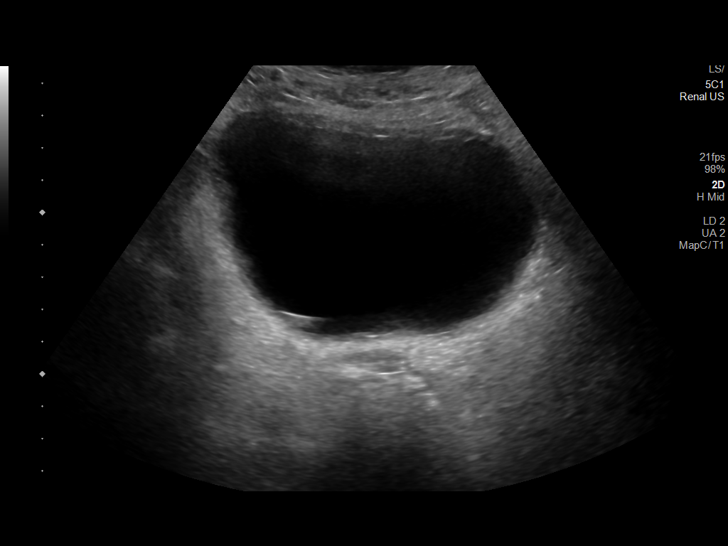
[im 4/10]
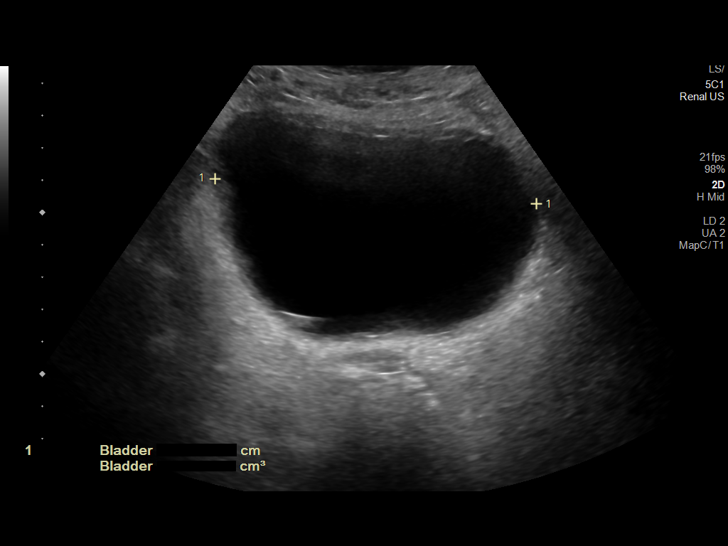
[im 5/10]
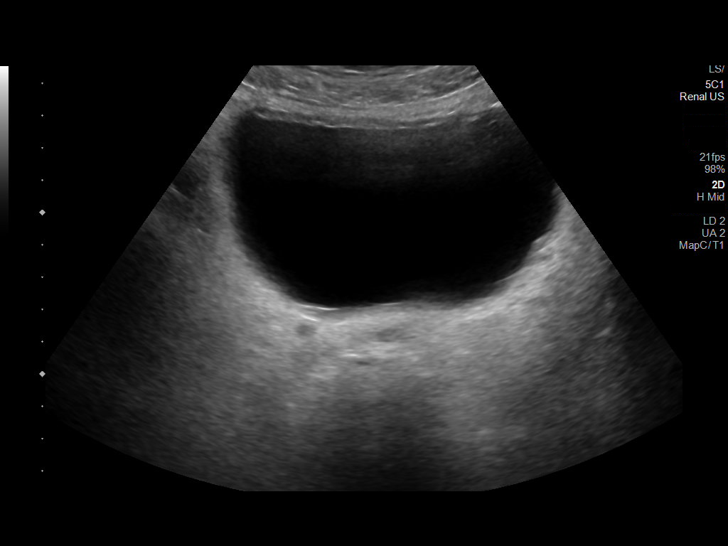
[im 6/10]
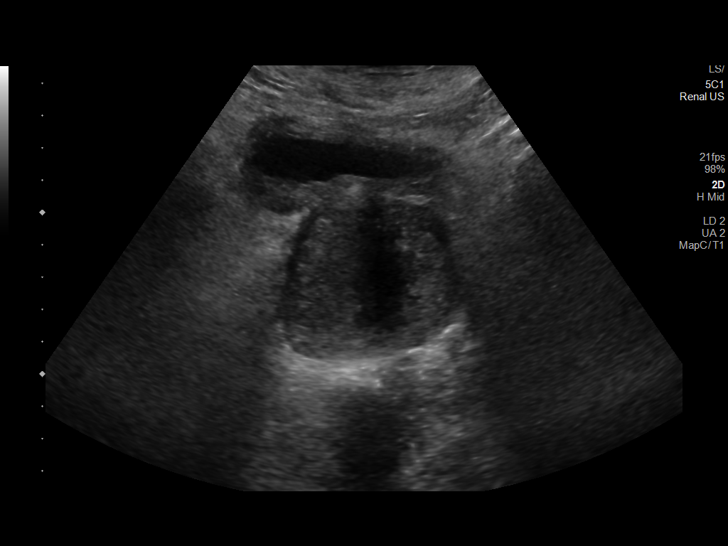
[im 7/10]
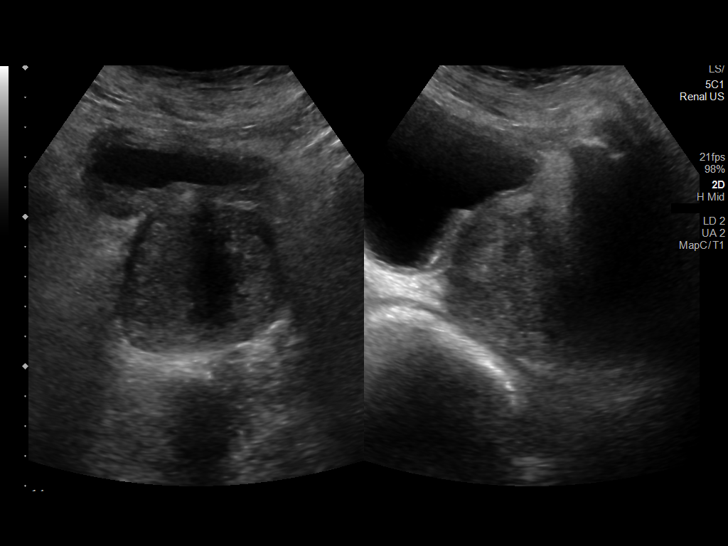
[im 8/10]
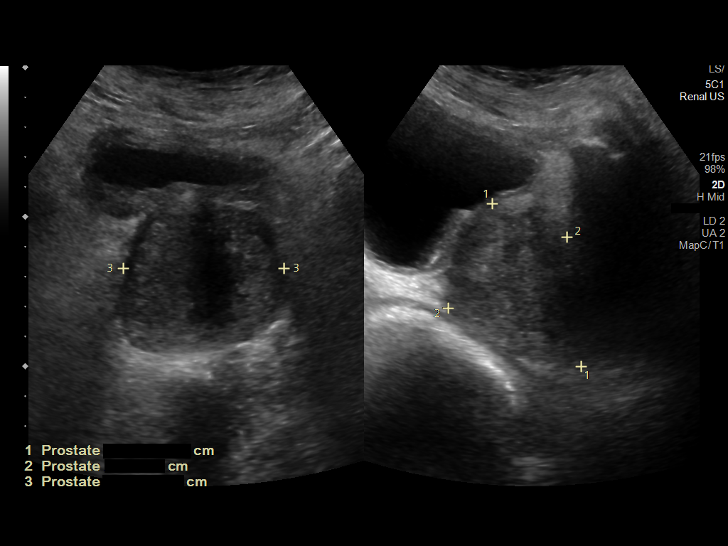
[im 9/10]
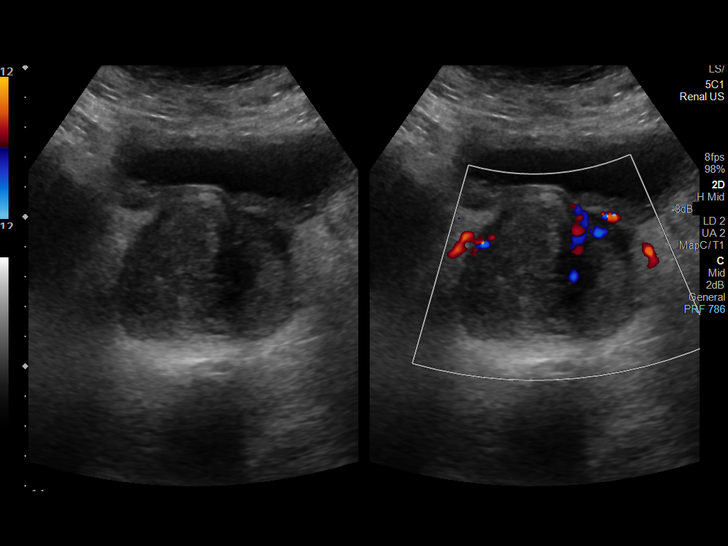
[im 10/10]
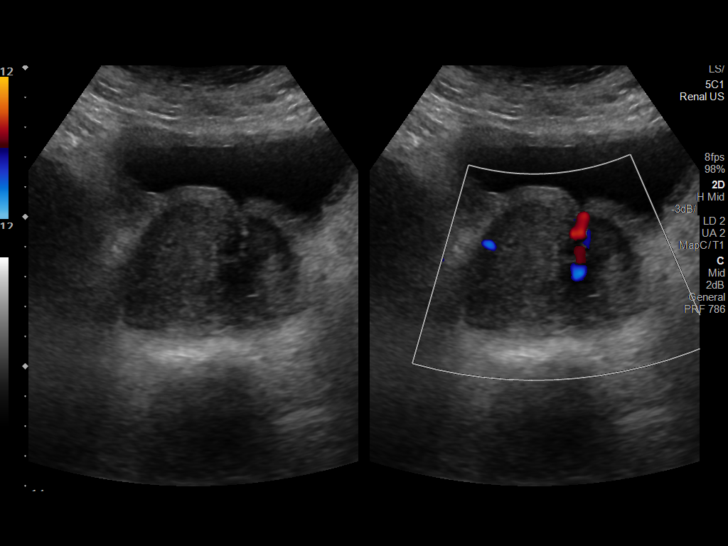

[10 of 10 positions shown; findings below may reference images not displayed]

FINDINGS: Minimal debris is noted in the dependent portion of the urinary
bladder. Prevoid bladder volume is 363.68 mL. Postvoid bladder
volume could not be obtained. The prostate gland measures 6.2 x
x 5.4 cm, total volume 80.71 mL.
IMPRESSION: 1. Minimal residual debris in the dependent portion of the urinary
bladder.
2. Enlarged prostate gland.
